# Patient Record
Sex: Male | Born: 1940 | Race: White | Hispanic: No | Smoking: Former smoker
Health system: Southern US, Community
[De-identification: ages and names within clinical notes are randomized; demographics above are authoritative.]

## PROBLEM LIST (undated history)

## (undated) DIAGNOSIS — E119 Type 2 diabetes mellitus without complications: Secondary | ICD-10-CM

## (undated) DIAGNOSIS — E039 Hypothyroidism, unspecified: Secondary | ICD-10-CM

## (undated) DIAGNOSIS — I2510 Atherosclerotic heart disease of native coronary artery without angina pectoris: Secondary | ICD-10-CM

## (undated) DIAGNOSIS — R42 Dizziness and giddiness: Secondary | ICD-10-CM

## (undated) DIAGNOSIS — I509 Heart failure, unspecified: Secondary | ICD-10-CM

## (undated) DIAGNOSIS — F0390 Unspecified dementia without behavioral disturbance: Secondary | ICD-10-CM

## (undated) DIAGNOSIS — M79606 Pain in leg, unspecified: Secondary | ICD-10-CM

## (undated) DIAGNOSIS — K469 Unspecified abdominal hernia without obstruction or gangrene: Secondary | ICD-10-CM

## (undated) DIAGNOSIS — I10 Essential (primary) hypertension: Secondary | ICD-10-CM

## (undated) DIAGNOSIS — E785 Hyperlipidemia, unspecified: Secondary | ICD-10-CM

## (undated) DIAGNOSIS — I739 Peripheral vascular disease, unspecified: Secondary | ICD-10-CM

## (undated) DIAGNOSIS — Z993 Dependence on wheelchair: Secondary | ICD-10-CM

## (undated) DIAGNOSIS — I714 Abdominal aortic aneurysm, without rupture, unspecified: Secondary | ICD-10-CM

## (undated) DIAGNOSIS — J449 Chronic obstructive pulmonary disease, unspecified: Secondary | ICD-10-CM

## (undated) DIAGNOSIS — C189 Malignant neoplasm of colon, unspecified: Secondary | ICD-10-CM

## (undated) HISTORY — DX: Essential (primary) hypertension: I10

## (undated) HISTORY — DX: Type 2 diabetes mellitus without complications: E11.9

## (undated) HISTORY — DX: Atherosclerotic heart disease of native coronary artery without angina pectoris: I25.10

## (undated) HISTORY — DX: Heart failure, unspecified: I50.9

## (undated) HISTORY — DX: Peripheral vascular disease, unspecified: I73.9

## (undated) HISTORY — PX: HERNIA REPAIR: SHX51

## (undated) HISTORY — DX: Unspecified abdominal hernia without obstruction or gangrene: K46.9

## (undated) HISTORY — PX: CARDIAC CATHETERIZATION: SHX172

## (undated) HISTORY — PX: ABDOMINAL SURGERY: SHX537

## (undated) HISTORY — DX: Abdominal aortic aneurysm, without rupture: I71.4

## (undated) HISTORY — DX: Chronic obstructive pulmonary disease, unspecified: J44.9

## (undated) HISTORY — DX: Abdominal aortic aneurysm, without rupture, unspecified: I71.40

## (undated) HISTORY — DX: Hyperlipidemia, unspecified: E78.5

## (undated) HISTORY — DX: Pain in leg, unspecified: M79.606

## (undated) HISTORY — PX: APPENDECTOMY: SHX54

## (undated) HISTORY — DX: Malignant neoplasm of colon, unspecified: C18.9

## (undated) HISTORY — DX: Hypothyroidism, unspecified: E03.9

## (undated) HISTORY — DX: Dizziness and giddiness: R42

---

## 2000-10-04 ENCOUNTER — Ambulatory Visit (HOSPITAL_COMMUNITY): Admission: RE | Admit: 2000-10-04 | Discharge: 2000-10-04 | Payer: Self-pay | Admitting: Family Medicine

## 2000-10-04 ENCOUNTER — Encounter: Payer: Self-pay | Admitting: Family Medicine

## 2003-11-23 ENCOUNTER — Emergency Department (HOSPITAL_COMMUNITY): Admission: EM | Admit: 2003-11-23 | Discharge: 2003-11-23 | Payer: Self-pay | Admitting: Emergency Medicine

## 2010-07-26 ENCOUNTER — Ambulatory Visit (HOSPITAL_COMMUNITY)
Admission: RE | Admit: 2010-07-26 | Discharge: 2010-07-26 | Disposition: A | Discharge status: Home / Self Care | Payer: Medicare Other | Source: Ambulatory Visit | Attending: Podiatry | Admitting: Podiatry

## 2010-07-26 ENCOUNTER — Other Ambulatory Visit (HOSPITAL_COMMUNITY): Payer: Self-pay | Admitting: Podiatry

## 2010-07-26 DIAGNOSIS — R52 Pain, unspecified: Secondary | ICD-10-CM

## 2010-07-26 DIAGNOSIS — I10 Essential (primary) hypertension: Secondary | ICD-10-CM | POA: Insufficient documentation

## 2010-07-26 DIAGNOSIS — I739 Peripheral vascular disease, unspecified: Secondary | ICD-10-CM | POA: Insufficient documentation

## 2010-07-26 DIAGNOSIS — R234 Changes in skin texture: Secondary | ICD-10-CM

## 2010-07-26 DIAGNOSIS — E119 Type 2 diabetes mellitus without complications: Secondary | ICD-10-CM | POA: Insufficient documentation

## 2010-08-03 ENCOUNTER — Encounter (INDEPENDENT_AMBULATORY_CARE_PROVIDER_SITE_OTHER): Payer: Medicare Other | Admitting: Vascular Surgery

## 2010-08-03 DIAGNOSIS — I70219 Atherosclerosis of native arteries of extremities with intermittent claudication, unspecified extremity: Secondary | ICD-10-CM

## 2010-08-04 NOTE — Consult Note (Signed)
NEW PATIENT CONSULTATION  Peter Harvey, Peter Harvey DOB:  28-Feb-1941                                       08/03/2010 CHART#:[id number]  I saw this patient in the office today in consultation concerning his discolored toes on the right foot.  He was referred by Dr. Nolen Mu. This is a pleasant 70 year old gentleman who developed the sudden onset of discoloration of the right third and fourth toes several weeks ago. He also had some bluish discoloration of both feet for some time.  He was sent for vascular consultation.  He states he does not remember any injury to the right foot and the discoloration occurred suddenly.  There has  been slight pain associated with the discoloration which has been stable.  He gives no history of rest pain.[name]  He did have some wounds on his legs about 10 years ago which healed without any problem.  He is unaware of any history of DVT or phlebitis.  PAST MEDICAL HISTORY:  Significant for non-insulin-dependent diabetes, hypertension, and hypercholesterolemia.  He denies any history of previous myocardial infarction, history of congestive heart failure or history of COPD.  PAST SURGICAL HISTORY:  Significant for previous appendectomy and bilateral inguinal hernia repairs.  SOCIAL HISTORY:  He is single.  He has 3 children.  He is retired.  He smokes a pack and a half of cigarettes.  He has been smoking for 40 years.  He does not drink alcohol on a regular basis.  FAMILY HISTORY:  He is unaware of any history of premature cardiovascular disease.  He had a brother who had a heart attack at age 73.  REVIEW OF SYSTEMS:  GENERAL:  He had no recent weight loss, weight gain or problems with his appetite. CARDIOVASCULAR:  He had no chest pain, chest pressure, palpitations or arrhythmias.  He has had no history of stroke, TIAs or amaurosis fugax. GI, neurologic, pulmonary, hematologic, GU, ENT, musculoskeletal, psychiatric, integumentary  review of systems is unremarkable and is documented on the medical history form in his chart.  PHYSICAL EXAMINATION:  GENERAL:  This is a pleasant 70 year old gentleman who appears his stated age. VITAL SIGNS:  Blood pressure 211/98, heart rate is 56, saturation 96%. HEENT:  Unremarkable. LUNGS:  Clear bilaterally to auscultation without rales, rhonchi or wheezing. CARDIOVASCULAR:  I do not detect any carotid bruits.  He has a regular rate and rhythm.  He has palpable brachial, radial and femoral pulses bilaterally.  I cannot palpate popliteal or pedal pulses.  He has hyperpigmentation bilaterally.  His femoral pulses especially on the right side are fairly prominent.  He also has a somewhat prominent aortic pulse. EXTREMITIES:  Exam reveals bluish discoloration of the right third and fourth toe and adjacent fourth toe.  He has some acrocyanosis bilaterally. ABDOMEN:  Soft and nontender with normal pitched bowel sounds and easily palpable aorta which appears somewhat enlarged.  NEUROLOGIC:  There s no focal weakness or paresthesias. SKIN:  There are no ulcers or rashes. He does have hyperpigmentation bilaterally.  I have reviewed his duplex scan from [facility] which shows an ABI on the right of 50% with a right great toe pressure of 23 mmHg.  On the left side, ABI is  54% with a left great toe pressure of 65 mmHg. Duplex suggest multilevel arterial occlusive disease.  I have also reviewed the  records from Dr. Loralie Champagne office.  He has been following the toes and treating these conservatively with Epsom salt soaks and topical medications.  Based on his exam, he may have an abdominal aortic aneurysm and actually arteriomegaly or ectasia of his femoral arteries in addition.  If he does have an aneurysm, this could potentially be a source embolization. He also clearly has evidence of multilevel infrainguinal arterial occlusive disease.  If the wounds on the right foot  progress, certainly this could become a limb threatening problem.  I have recommended we proceed with arteriography to evaluate his occlusive disease and also assess for possible aneurysm.  We have discussed the indications for the procedure and potential complications including but not  limited to bleeding or arterial injury.  He has also complained of some unsteady gait and when he returns for his follow-up visit after his arteriogram will also obtain an ultrasound his aorta to further document the size of the aneurysm if in fact he does have an aneurysm and also will obtain a carotid duplex scan to workup his vertebrobasilar symptoms and see if he has any evidence of vertebral disease or significant carotid disease.  I have also had a long discussion with him about the importance of tobacco cessation today and he understands this not only compromises his circulation but also increases his risk for aneurysm enlargement if in fact he does have an aneurysm.  Will make further recommendations pending results of his workup.  I will plan on seeing him back next week.    Di Kindle. Edilia Bo, M.D. Electronically Signed  CSD/MEDQ  D:  08/03/2010  T:  08/04/2010  Job:  [id number]  cc:   B. Theola Sequin, MD

## 2010-08-10 ENCOUNTER — Ambulatory Visit (INDEPENDENT_AMBULATORY_CARE_PROVIDER_SITE_OTHER): Payer: Medicare Other | Admitting: Vascular Surgery

## 2010-08-10 ENCOUNTER — Encounter: Payer: Self-pay | Admitting: Cardiovascular Disease

## 2010-08-10 ENCOUNTER — Other Ambulatory Visit (INDEPENDENT_AMBULATORY_CARE_PROVIDER_SITE_OTHER): Payer: Medicare Other

## 2010-08-10 ENCOUNTER — Encounter (INDEPENDENT_AMBULATORY_CARE_PROVIDER_SITE_OTHER): Payer: Medicare Other

## 2010-08-10 DIAGNOSIS — I6529 Occlusion and stenosis of unspecified carotid artery: Secondary | ICD-10-CM

## 2010-08-10 DIAGNOSIS — R269 Unspecified abnormalities of gait and mobility: Secondary | ICD-10-CM

## 2010-08-10 DIAGNOSIS — I714 Abdominal aortic aneurysm, without rupture: Secondary | ICD-10-CM

## 2010-08-11 NOTE — Assessment & Plan Note (Signed)
OFFICE VISIT  Peter Harvey, Peter Harvey DOB:  Oct 13, 1940                                       08/10/2010 CHART#:[id number]  HISTORY:  I saw this patient in the office today for followup of his discoloration of his toes on the right foot.  I had seen him in consultation on 08/03/2010.  He comes in for carotid duplex scan as she has had some problems with dizziness.  In addition, I was suspicious that he might have an aneurysm.  He had a duplex of his aorta.  Since I saw him last he states that he had a small amount of drainage between the fourth and third toes of the right foot.  He has had no fever.  He is diabetic.  He has had no abdominal or back pain.  He continues to have problems with dizziness.  REVIEW OF SYSTEMS:  CARDIOVASCULAR:  He had no chest pain, chest pressure, palpitations or arrhythmias. PULMONARY:  He had no productive cough, bronchitis, asthma or wheezing.  PHYSICAL EXAMINATION:  This a pleasant 70 year old gentleman who appears his stated age.  Blood pressure is 186/90, heart rate is 66, respiratory rate is 20.  Lungs:  Clear bilaterally to auscultation.  Cardiovascular exam:  He has a regular rate and rhythm.  He has palpable femoral pulses.  I could not palpate pedal pulses.  Neurologic:  He has no focal weakness or paresthesias.  I did independently interpret his carotid duplex scan which shows a 60% to 79% left carotid stenosis with no significant stenosis on the right. Both vertebral arteries are patent with normally directed flow.  The duplex of his aorta shows the maximum diameter of his aneurysm is 5.9 cm.  Both iliacs were also ectatic.  Right common iliac artery measures 2.3 cm in maximum diameter and the left common iliac,  2.98 cm in maximum diameter.  Given the size of his abdominal aortic aneurysm, I have recommend elective repair.  He has bilateral common iliac artery aneurysms also. Will schedule him for an arteriogram,  and also a CT scan to evaluate him to see if he is a candidate for endovascular repair although with the iliac aneurysms, this may not be possible.  There is poor visualization of the aorta proximally with reason I think it would be necessary the CAT scan.  In addition in anticipation of elective repair of his aneurysm, will set him up with our cardiology for preoperative cardiac evaluation.  Once his workup is complete, we can schedule elective repair of the aneurysm.  In the meantime, will continue to keep a close eye on his toes.  I have instructed him on how to keep dry gauze between the toes and also to continue to soak the foot in lukewarm Dial soap soaks. I have also written him a prescription for Keflex for 2 weeks. In addition he is having pain in the foot and so I have give him a prescription for Percocet #30 for pain.  His arteriogram is scheduled for 08/22/2010.    Di Kindle. Edilia Bo, M.D. Electronically Signed  CSD/MEDQ  D:  08/10/2010  T:  08/11/2010  Job:  [id number]  cc:   B. Theola Sequin, MD [facility]

## 2010-08-12 ENCOUNTER — Other Ambulatory Visit: Payer: Self-pay | Admitting: Vascular Surgery

## 2010-08-12 DIAGNOSIS — I714 Abdominal aortic aneurysm, without rupture: Secondary | ICD-10-CM

## 2010-08-17 ENCOUNTER — Ambulatory Visit
Admission: RE | Admit: 2010-08-17 | Discharge: 2010-08-17 | Disposition: A | Discharge status: Home / Self Care | Payer: Medicare Other | Source: Ambulatory Visit | Attending: Vascular Surgery | Admitting: Vascular Surgery

## 2010-08-17 DIAGNOSIS — I714 Abdominal aortic aneurysm, without rupture: Secondary | ICD-10-CM

## 2010-08-17 MED ORDER — IOHEXOL 300 MG/ML  SOLN
100.0000 mL | Freq: Once | INTRAMUSCULAR | Status: AC | PRN
  Administered 2010-08-17: 100 mL via INTRAVENOUS

## 2010-08-22 ENCOUNTER — Ambulatory Visit (HOSPITAL_COMMUNITY)
Admission: RE | Admit: 2010-08-22 | Discharge: 2010-08-22 | Disposition: A | Discharge status: Home / Self Care | Payer: Medicare Other | Source: Ambulatory Visit | Attending: Vascular Surgery | Admitting: Vascular Surgery

## 2010-08-22 DIAGNOSIS — F17200 Nicotine dependence, unspecified, uncomplicated: Secondary | ICD-10-CM | POA: Insufficient documentation

## 2010-08-22 DIAGNOSIS — I723 Aneurysm of iliac artery: Secondary | ICD-10-CM | POA: Insufficient documentation

## 2010-08-22 DIAGNOSIS — E119 Type 2 diabetes mellitus without complications: Secondary | ICD-10-CM | POA: Insufficient documentation

## 2010-08-22 DIAGNOSIS — I748 Embolism and thrombosis of other arteries: Secondary | ICD-10-CM | POA: Insufficient documentation

## 2010-08-22 DIAGNOSIS — I714 Abdominal aortic aneurysm, without rupture, unspecified: Secondary | ICD-10-CM | POA: Insufficient documentation

## 2010-08-22 DIAGNOSIS — I10 Essential (primary) hypertension: Secondary | ICD-10-CM | POA: Insufficient documentation

## 2010-08-22 DIAGNOSIS — I70299 Other atherosclerosis of native arteries of extremities, unspecified extremity: Secondary | ICD-10-CM | POA: Insufficient documentation

## 2010-08-22 DIAGNOSIS — E785 Hyperlipidemia, unspecified: Secondary | ICD-10-CM | POA: Insufficient documentation

## 2010-08-22 DIAGNOSIS — I724 Aneurysm of artery of lower extremity: Secondary | ICD-10-CM

## 2010-08-22 DIAGNOSIS — I708 Atherosclerosis of other arteries: Secondary | ICD-10-CM | POA: Insufficient documentation

## 2010-08-22 LAB — GLUCOSE, CAPILLARY: Glucose-Capillary: 108 mg/dL — ABNORMAL HIGH (ref 70–99)

## 2010-08-23 LAB — POCT I-STAT, CHEM 8
Chloride: 108 mEq/L (ref 96–112)
HCT: 44 % (ref 39.0–52.0)
Potassium: 4.3 mEq/L (ref 3.5–5.1)

## 2010-08-23 NOTE — Op Note (Signed)
NAME:  Peter Harvey, Peter Harvey NO.:  [id number]  MEDICAL RECORD NO.:  [id number]           PATIENT TYPE:  O  LOCATION:  [facility]                         FACILITY:  [facility]  PHYSICIAN:  Di Kindle. Edilia Bo, M.D.DATE OF BIRTH:  [birth year]  DATE OF PROCEDURE:  08/22/2010 DATE OF DISCHARGE:                              OPERATIVE REPORT   PROCEDURE: 1. Ultrasound-guided access to the left common femoral artery. 2. Aortogram with bilateral iliac arteriogram and bilateral lower     extremity runoff.  INDICATIONS:  This is a 70 year old gentleman who had presented with discoloration of his toes of the right foot.  It was felt that he likely had an abdominal aortic aneurysm and possibly atheroembolic disease but also significant infrainguinal arterial occlusive disease bilaterally. He has had an ultrasound of his aorta which showed a 5.9-cm infrarenal abdominal aortic aneurysm with bilateral iliac artery aneurysms.  The right common iliac artery measured 2.3 cm in maximum diameter and the left common iliac artery measured 2.98 cm in maximum diameter.  He underwent a CT scan which showed a suspicious mass in the colon at the level of the ileocecal valve in addition to a large greater than 6 cm abdominal aortic aneurysm with bilateral common iliac artery aneurysms and bilateral common femoral artery aneurysms.  He also had bilateral occlusions of the hypogastric arteries by CT scan.  TECHNIQUE:  The patient was taken to the PV lab and received a milligram of Versed and 50 mcg of fentanyl.  Both groins were prepped and draped in usual sterile fashion.  After the skin was infiltrated with 1% lidocaine and under ultrasound guidance, the left common femoral artery was cannulated above the level of the aneurysm.  A 5-French sheath was introduced over a wire.  Pigtail catheter was positioned at the L1 vertebral body and flush aortogram obtained.  The catheter was then repositioned  above the aortic bifurcation and oblique iliac projections were obtained.  Bilateral lower extremity runoff films were obtained. Additional spot films were obtained in the left leg to the left femoral sheath.  FINDINGS:  There were single renal arteries bilaterally with no significant renal artery stenosis identified.  The patient appears to have a reasonable neck by arteriography below the renals.  There was a large aneurysm, the size cannot be determined by the study as there is significant laminated thrombus.  There also is ectasia and aneurysm disease of bilateral common iliac arteries.  The hypogastric arteries are occluded bilaterally.  The common femoral arteries are also aneurysmal.  Superficial femoral arteries are occluded bilaterally.  On the right side, there is reconstitution of the posterior tibial and peroneal arteries.  There was a high takeoff of the posterior tibial artery on the right at the level of the knee.  On the left side, there was a superficial femoral artery occlusion with reconstitution of the posterior tibial artery distally and the peroneal artery.  Deep femoral arteries are patent bilaterally with some mild diffuse disease.  CONCLUSIONS: 1. Abdominal aortic aneurysm, bilateral iliac artery aneurysms,     bilateral common femoral artery aneurysms. 2. Bilateral hypogastric artery occlusions. 3. Severe infrainguinal arterial occlusive  disease bilaterally as     described above.     Di Kindle. Edilia Bo, M.D.     CSD/MEDQ  D:  08/22/2010  T:  08/22/2010  Job:  [id number]  cc:   Dr. Theola Sequin. [facility].  Electronically Signed by Waverly Ferrari M.D. on 08/23/2010 08:39:40 AM

## 2010-08-24 ENCOUNTER — Ambulatory Visit (INDEPENDENT_AMBULATORY_CARE_PROVIDER_SITE_OTHER): Payer: Medicare Other | Admitting: Cardiovascular Disease

## 2010-08-24 ENCOUNTER — Encounter: Payer: Self-pay | Admitting: Cardiovascular Disease

## 2010-08-24 DIAGNOSIS — R0989 Other specified symptoms and signs involving the circulatory and respiratory systems: Secondary | ICD-10-CM

## 2010-08-24 DIAGNOSIS — I10 Essential (primary) hypertension: Secondary | ICD-10-CM

## 2010-08-24 DIAGNOSIS — I714 Abdominal aortic aneurysm, without rupture: Secondary | ICD-10-CM

## 2010-08-24 DIAGNOSIS — F17200 Nicotine dependence, unspecified, uncomplicated: Secondary | ICD-10-CM | POA: Insufficient documentation

## 2010-08-24 DIAGNOSIS — Z01818 Encounter for other preprocedural examination: Secondary | ICD-10-CM | POA: Insufficient documentation

## 2010-08-24 NOTE — Patient Instructions (Signed)
Your physician has requested that you have a lexiscan myoview. For further information please visit [email or web address] Please follow instruction sheet, as given.  Your physician recommends that you schedule a follow-up appointment in:  As needed with Dr. Eden Emms

## 2010-08-24 NOTE — Progress Notes (Signed)
Peter Harvey is seen at the request of Dr Durwin Nora.  He has bilateral iliac aneurysm,AAA, PVD with gangrenous right toes.  He is a smoker.  Has exertional dyspnea but not chest pain.   He needs clearance for vascular surgery.  Very poor functional status.  In wheelchair in our office.  No history of CAD, MI, CHF, syncope or palpitations.  Has HTN and DM followed by Dr Regino Schultze in [place].  Significant dizzyness that limits mobility.  AAA is 5.9 cm  With RCI 2.3 cm and LCI 2.98 cm.  ICA stenosis 60-79% on left.  Given high risk surgery, poor functional status and documented vascular disease guidelines indicate need for lexiscan myovue.    ROS: Denies fever, malais, weight loss, blurry vision, decreased visual acuity, cough, sputum, SOB, hemoptysis, pleuritic pain, palpitaitons, heartburn, abdominal pain, melena, lower extremity edema, claudication, or rash.   General: Affect appropriate Chronically ill appearing HEENT: normal Neck supple with no adenopathy JVP normal left  bruits no thyromegaly Lungs clear with no wheezing and good diaphragmatic motion Heart:  S1/S2 no murmur,rub, gallop or click PMI normal Abdomen: benighn, BS positve, no tenderness, AAA palpable no bruit.  No HSM or HJR Distal pulses poor with femoral bruit and dry gangrene toes right foot Plus one bilateral  edema Neuro non-focal Skin warm and dry No muscular weakness  Medications Current Outpatient Prescriptions  Medication Sig Dispense Refill  . aspirin 81 MG EC tablet Take 81 mg by mouth daily.        [name] atenolol (TENORMIN) 50 MG tablet Take 50 mg by mouth daily.        . cephALEXin (KEFLEX) 500 MG capsule Take 500 mg by mouth 3 (three) times daily.        [name] glyBURIDE-metformin (GLUCOVANCE) 5-500 MG per tablet Take 1 tablet by mouth daily with breakfast.        . levothyroxine (SYNTHROID, LEVOTHROID) 100 MCG tablet Take 100 mcg by mouth daily.        [name] oxyCODONE-acetaminophen (PERCOCET) 5-325 MG per tablet Take 1 tablet  by mouth every 4 (four) hours as needed.        . quinapril (ACCUPRIL) 20 MG tablet Take 20 mg by mouth at bedtime.          Allergies Review of patient's allergies indicates no known allergies.  Family History: Family History  Problem Relation Age of Onset  . Diabetes Mother     Social History: History   Social History  . Marital Status: Married    Spouse Name: N/A    Number of Children: N/A  . Years of Education: N/A   Occupational History  . Not on file.   Social History Main Topics  . Smoking status: Current Some Day Smoker  . Smokeless tobacco: Not on file  . Alcohol Use: Not on file  . Drug Use: Not on file  . Sexually Active: Not on file   Other Topics Concern  . Not on file   Social History Narrative  . No narrative on file    Electrocardiogram:  NSR low atrial focus no previous MI HR 60 nonspecific lateral T wave changes 08/22/10   Assessment and Plan

## 2010-08-24 NOTE — Assessment & Plan Note (Signed)
Well controlled.  Continue current medications and low sodium Dash type diet.    

## 2010-08-24 NOTE — Assessment & Plan Note (Signed)
F/U carotid duplex with Dr Edilia Bo in 6 months

## 2010-08-24 NOTE — Assessment & Plan Note (Signed)
High risk surgery, CRF;s, known vascular diseae, DM and poor functional status.  Lexiscan myovue to clear.

## 2010-08-24 NOTE — Assessment & Plan Note (Signed)
Counseled for less than 10 minutes.  Quitting even two weeks before surgery would be beneficial.  Consider welbutrin in post op phase

## 2010-08-25 NOTE — Procedures (Unsigned)
DUPLEX ULTRASOUND OF ABDOMINAL AORTA  INDICATION:  Abdominal aortic aneurysm.  HISTORY: Diabetes:  Yes Cardiac:  No Hypertension:  Yes Smoking:  Yes, 1-1/2 packs per day Connective Tissue Disorder: Family History:  No Previous Surgery:  No  DUPLEX EXAM:         AP (cm)                   TRANSVERSE (cm) Proximal             Not visualized            Not visualized Mid                  5.71 cm                   5.90 cm Distal               5.61 cm                   5.95 cm Right Iliac          2.19 cm                   2.30 cm Left Iliac           2.49 cm                   2.98 cm  PREVIOUS:  Date:  AP:  TRANSVERSE:  IMPRESSION: 1. Abdominal aortic aneurysm noted in the mid to distal abdominal     aorta that measures 5.61 x 5.95 cm. 2. Proximal abdominal aorta not visualized due to overlying bowel gas.  ___________________________________________ Di Kindle. Edilia Bo, M.D.  EM/MEDQ  D:  08/10/2010  T:  08/10/2010  Job:  [id number]

## 2010-08-25 NOTE — Procedures (Unsigned)
CAROTID DUPLEX EXAM  INDICATION:  Unsteady gait.  HISTORY: Diabetes:  Yes Cardiac:  No Hypertension:  Yes Smoking:  Yes, 1-1/2 packs per day Previous Surgery:  No CV History:  Dizziness, unsteady gait. Amaurosis Fugax No, Paresthesias Yes, Hemiparesis No                                      RIGHT             LEFT Brachial systolic pressure: Brachial Doppler waveforms: Vertebral direction of flow:        antegrade         antegrade DUPLEX VELOCITIES (cm/sec) CCA peak systolic                   91                66 ECA peak systolic                   51                116 ICA peak systolic                   106               268 ICA end diastolic                   33                97 PLAQUE MORPHOLOGY:                  mixed             mixed PLAQUE AMOUNT:                      mild              moderate/severe PLAQUE LOCATION:                    CCA, ICA          ICA, CCA, ECA  IMPRESSION: 1. Right internal carotid artery velocities suggest 1%-39% stenosis. 2. Left internal carotid artery velocities suggest 60%-79% stenosis     (high end of range). 3. Antegrade vertebral arteries bilaterally.  ___________________________________________ Di Kindle. Edilia Bo, M.D.  EM/MEDQ  D:  08/10/2010  T:  08/10/2010  Job:  [id number]

## 2010-08-30 ENCOUNTER — Ambulatory Visit (HOSPITAL_COMMUNITY): Payer: Medicare Other | Attending: Cardiovascular Disease | Admitting: Radiology

## 2010-08-30 DIAGNOSIS — I714 Abdominal aortic aneurysm, without rupture, unspecified: Secondary | ICD-10-CM | POA: Insufficient documentation

## 2010-08-30 DIAGNOSIS — R0602 Shortness of breath: Secondary | ICD-10-CM

## 2010-08-30 DIAGNOSIS — R0609 Other forms of dyspnea: Secondary | ICD-10-CM

## 2010-08-30 DIAGNOSIS — Z01810 Encounter for preprocedural cardiovascular examination: Secondary | ICD-10-CM | POA: Insufficient documentation

## 2010-08-30 DIAGNOSIS — E119 Type 2 diabetes mellitus without complications: Secondary | ICD-10-CM

## 2010-08-30 DIAGNOSIS — Z01818 Encounter for other preprocedural examination: Secondary | ICD-10-CM

## 2010-08-30 MED ORDER — TECHNETIUM TC 99M TETROFOSMIN IV KIT
33.0000 | PACK | Freq: Once | INTRAVENOUS | Status: AC | PRN
  Administered 2010-08-30: 33 via INTRAVENOUS

## 2010-08-30 MED ORDER — REGADENOSON 0.4 MG/5ML IV SOLN
0.4000 mg | Freq: Once | INTRAVENOUS | Status: AC
  Administered 2010-08-30: 0.4 mg via INTRAVENOUS

## 2010-08-30 MED ORDER — TECHNETIUM TC 99M TETROFOSMIN IV KIT
11.0000 | PACK | Freq: Once | INTRAVENOUS | Status: AC | PRN
  Administered 2010-08-30: 11 via INTRAVENOUS

## 2010-08-30 NOTE — Progress Notes (Signed)
[facility] SITE 3 NUCLEAR MED [address] [place] [address] [phone]  Cardiology Nuclear Med Study  Peter Harvey is a 70 y.o. male [id number] [birth year]   Nuclear Med Background Indication for Stress Test:  Evaluation for Ischemia and Surgical Clearance for AAA and Bilateral Iliac Aneursym History:  No previous documented CAD Cardiac Risk Factors: Carotid Disease, Claudication, Hypertension, NIDDM, PVD and Smoker  Symptoms:  Dizziness and DOE   Nuclear Pre-Procedure Caffeine/Decaff Intake:  None NPO After: 600pm   Lungs:  clear IV 0.9% NS with Angio Cath:  22g  IV Site: R Hand  IV Started by:  Cathlyn Parsons, RN  Chest Size (in):  38 Cup Size: n/a  Height: 5\' 11"  (1.803 m)  Weight:  149 lb (67.586 kg)  BMI:  Body mass index is 20.78 kg/(m^2). Tech Comments:  Atenolol held x 4 hrs and Blood sugar @ 9am was 179;no Metformin    Nuclear Med Study 1 or 2 day study: 1 day  Stress Test Type:  Eugenie Birks  Reading MD: Arvilla Meres, MD  Order Authorizing Provider:  Dr.Nishan  Resting Radionuclide: Technetium 78m Tetrofosmin  Resting Radionuclide Dose: 11 mCi   Stress Radionuclide:  Technetium 69m Tetrofosmin  Stress Radionuclide Dose: 33 mCi           Stress Protocol Rest HR: 47 Sress HR:61  Rest BP: 149/102 Stress BP:146/87  Exercise Time (min): n/a METS: n/a          Dose of Adenosine (mg):  n/a Dose of Lexiscan: 0.4 mg  Dose of Atropine (mg): n/a Dose of Dobutamine: n/a mcg/kg/min (at max HR)  Stress Test Technologist: Frederick Peers, EMT-P  Nuclear Technologist:  Doyne Keel, CNMT     Rest Procedure:  Myocardial perfusion imaging was performed at rest 45 minutes following the intravenous administration of Technetium 1m Tetrofosmin. Rest ECG: NS with PVCs  Stress Procedure:  The patient received IV Lexiscan 0.4 mg over 15-seconds.  Technetium 13m Tetrofosmin injected at 30-seconds.  There were non specific ST changes with  Lexiscan/PVCs,/BIgemny.  Quantitative spect images were obtained after a 45 minute delay. Stress ECG: No significant change from baseline ECG  QPS Raw Data Images:  Normal; no motion artifact; normal heart/lung ratio. Stress Images:  There is decreased uptake in the inferior wall. Rest Images:  There is decreased uptake in the inferior wall. Subtraction (SDS):  Previous inferior infarct with mild peri-infarct ischemia. Transient Ischemic Dilatation (Normal <1.22):  1.03 Lung/Heart Ratio (Normal <0.45):  0.31  Quantitative Gated Spect Images QGS EDV:  125 ml QGS ESV:  57 ml QGS cine images:  LV is mildly dialted. Normal LV function. QGS EF: 57%  Impression Exercise Capacity:  Lexiscan with no exercise. BP Response:  n/a Clinical Symptoms:  n/a ECG Impression:  No significant ST segment change suggestive of ischemia. Comparison with Prior Nuclear Study: No images to compare  Overall Impression:  Abnormal stress nuclear study. Previous inferior infarct with mild peri-infarct ischemia.     [name]

## 2010-08-31 NOTE — Progress Notes (Signed)
Routed to Dr.Nishan.Mirna Mires

## 2010-09-01 ENCOUNTER — Encounter: Payer: Self-pay | Admitting: *Deleted

## 2010-09-01 ENCOUNTER — Telehealth: Payer: Self-pay | Admitting: Cardiovascular Disease

## 2010-09-01 DIAGNOSIS — I10 Essential (primary) hypertension: Secondary | ICD-10-CM

## 2010-09-01 NOTE — Telephone Encounter (Signed)
Spoke with pt and daughter in law and gave them stress test results and Dr. Fabio Bering recommendation for cath with Dr. Excell Seltzer. Cath arranged for Sep 09, 2010 at 8:30 in [facility] with Dr. Excell Seltzer. They are aware pt will need lab work and chest X-ray done prior to procedure. Pt will go to [facility] on Sep 06, 2010 to have done. I verbally went over all instructions with daughter in law and she verbalizes understanding.  Will mail instructions to pt.

## 2010-09-01 NOTE — Telephone Encounter (Signed)
RN attempted to call Peter Harvey @ Dr Luan Moore office-out to lunch, left message to contact our office. Pt had Lexiscan on Tues, to be reviewed by Dr Eden Emms.

## 2010-09-01 NOTE — Telephone Encounter (Signed)
Called pt to give him results of stress test and need for cath.  Left message with daughter in law for pt to call back.

## 2010-09-01 NOTE — Telephone Encounter (Signed)
Status of cardiac clearance for colonoscopy- scheduled pending 6/7. Fax  [phone].

## 2010-09-01 NOTE — Telephone Encounter (Signed)
Call patient son to clarify his colonoscopy md. Pt MD is Dr. Evette Cristal office # [phone].

## 2010-09-01 NOTE — Telephone Encounter (Signed)
Spoke with Irving Burton at Dr. Luan Moore office.  They need clearance from Dr. Eden Emms to proceed with colonoscopy on October 13, 2010. Also requesting copy of stress test to be faxed to their office once reviewed by Dr Eden Emms.

## 2010-09-01 NOTE — Telephone Encounter (Signed)
RN attempted to contact Irving Burton regarding if sedation would be used, seems to be a fax number.  Pt is only on ASA 81 mg. RN will forward message to Dr Clance Boll re: cardiac clearance. RN attempted to call Pt, left message to call back.

## 2010-09-01 NOTE — Telephone Encounter (Signed)
Addended byGenice Rouge on: 09/01/2010 04:50 PM   Modules accepted: Orders

## 2010-09-01 NOTE — Telephone Encounter (Signed)
Needs cath before colonoscopy and AAA surgery

## 2010-09-01 NOTE — Telephone Encounter (Signed)
Peter Harvey at Dr. Luan Moore office notified that pt will need cath prior to procedure.

## 2010-09-01 NOTE — Progress Notes (Signed)
This guy needs a cath to clear for surgery. Set up for [facility] next week from radial artery (per message from Dr Eden Emms) Pt aware. Cath scheduled for Sep 09, 2010 at 8:30 with Dr. Excell Seltzer.

## 2010-09-02 ENCOUNTER — Other Ambulatory Visit (INDEPENDENT_AMBULATORY_CARE_PROVIDER_SITE_OTHER): Payer: Medicare Other

## 2010-09-02 ENCOUNTER — Ambulatory Visit (INDEPENDENT_AMBULATORY_CARE_PROVIDER_SITE_OTHER)
Admission: RE | Admit: 2010-09-02 | Discharge: 2010-09-02 | Disposition: A | Discharge status: Home / Self Care | Payer: Medicare Other | Source: Ambulatory Visit | Attending: Cardiovascular Disease | Admitting: Cardiovascular Disease

## 2010-09-02 DIAGNOSIS — I714 Abdominal aortic aneurysm, without rupture: Secondary | ICD-10-CM

## 2010-09-02 DIAGNOSIS — R9430 Abnormal result of cardiovascular function study, unspecified: Secondary | ICD-10-CM

## 2010-09-02 DIAGNOSIS — I10 Essential (primary) hypertension: Secondary | ICD-10-CM

## 2010-09-02 LAB — BASIC METABOLIC PANEL
CO2: 32 mEq/L (ref 19–32)
Chloride: 107 mEq/L (ref 96–112)
Sodium: 144 mEq/L (ref 135–145)

## 2010-09-02 LAB — CBC WITH DIFFERENTIAL/PLATELET
Basophils Relative: 0.3 % (ref 0.0–3.0)
Eosinophils Absolute: 0.1 10*3/uL (ref 0.0–0.7)
HCT: 40.7 % (ref 39.0–52.0)
Hemoglobin: 13.7 g/dL (ref 13.0–17.0)
Lymphocytes Relative: 18.8 % (ref 12.0–46.0)
Lymphs Abs: 1.4 10*3/uL (ref 0.7–4.0)
MCHC: 33.7 g/dL (ref 30.0–36.0)
MCV: 86.9 fl (ref 78.0–100.0)
Monocytes Absolute: 0.6 10*3/uL (ref 0.1–1.0)
Neutro Abs: 5.6 10*3/uL (ref 1.4–7.7)
RBC: 4.69 Mil/uL (ref 4.22–5.81)

## 2010-09-09 ENCOUNTER — Inpatient Hospital Stay (HOSPITAL_BASED_OUTPATIENT_CLINIC_OR_DEPARTMENT_OTHER)
Admission: RE | Admit: 2010-09-09 | Discharge: 2010-09-09 | Disposition: A | Discharge status: Home / Self Care | Payer: Medicare Other | Source: Ambulatory Visit | Attending: Cardiovascular Disease | Admitting: Cardiovascular Disease

## 2010-09-09 DIAGNOSIS — I714 Abdominal aortic aneurysm, without rupture, unspecified: Secondary | ICD-10-CM | POA: Insufficient documentation

## 2010-09-09 DIAGNOSIS — I2510 Atherosclerotic heart disease of native coronary artery without angina pectoris: Secondary | ICD-10-CM

## 2010-09-09 DIAGNOSIS — R9439 Abnormal result of other cardiovascular function study: Secondary | ICD-10-CM | POA: Insufficient documentation

## 2010-09-09 DIAGNOSIS — I2582 Chronic total occlusion of coronary artery: Secondary | ICD-10-CM | POA: Insufficient documentation

## 2010-09-09 DIAGNOSIS — I739 Peripheral vascular disease, unspecified: Secondary | ICD-10-CM | POA: Insufficient documentation

## 2010-09-21 ENCOUNTER — Ambulatory Visit (INDEPENDENT_AMBULATORY_CARE_PROVIDER_SITE_OTHER): Payer: Medicare Other | Admitting: Vascular Surgery

## 2010-09-21 DIAGNOSIS — I714 Abdominal aortic aneurysm, without rupture: Secondary | ICD-10-CM

## 2010-09-21 DIAGNOSIS — L98499 Non-pressure chronic ulcer of skin of other sites with unspecified severity: Secondary | ICD-10-CM

## 2010-09-22 ENCOUNTER — Encounter: Payer: Self-pay | Admitting: Cardiovascular Disease

## 2010-09-22 NOTE — Assessment & Plan Note (Signed)
OFFICE VISIT  Peter Harvey, Peter Harvey DOB:  1940/09/24                                       09/21/2010 CHART#:[id number]  I saw the patient in the office today for continued follow-up of his multiple aneurysms and infrainguinal arterial occlusive disease.  This is a pleasant 70 year old gentleman who I had seen in consultation with some discolored toes on the right foot.  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  He subsequently had a CT scan to further assess his aneurysm, and this in addition to his aneurysms showed a soft tissue mass at the ileocecal valve, worrisome for possible colon cancer.  He has been referred to Dr. Evette Cristal at [facility] and is scheduled for a colonoscopy on 09/12/2010 to further work this up.  In addition, the patient is followed by Dr. Eden Emms and did have a cardiac catheterization by Dr. Tonny Bollman which showed severe right coronary artery stenosis with tandem lesions in the mid and distal vessel.  He had a moderate distal left main stenosis with total occlusion of a small obtuse marginal branch and a mild to moderate left anterior descending stenosis.  Dr. Excell Seltzer did not think that preoperative PCI was a good option given the risk of increased perioperative events in these patients, and I have discussed this with him on the phone today and he feels that it would be best to address the aneurysm and manage his cardiac disease medically.  The patient comes in today to review the case, as it is obviously complicated.  He has had no significant abdominal or back pain.  He has had no nausea, vomiting.  He has not noted any other changes bowel habits.  REVIEW OF SYSTEMS:  CARDIOVASCULAR:  He has had no chest pain, chest pressure, palpitations or  arrhythmias.  PHYSICAL EXAMINATION:  This is a debilitated 70 year old gentleman. Blood pressure is 185/94, heart rate is 47, saturation 98%.  Lungs: Clear bilaterally to auscultation.  Cardiovascular:  He has a regular rate and rhythm.  He has aneurysmal femoral pulses.  I cannot palpate pedal pulses.  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  This patient has a 6-cm infrarenal abdominal aortic aneurysm in addition to bilateral common iliac artery aneurysms and bilateral femoral artery aneurysms.  In addition, he has severe infrainguinal arterial occlusive disease with bilateral superficial femoral artery occlusions and tibial occlusive disease.  He also has bilateral hypogastric artery occlusions. We need to await the results of his colonoscopy before planning surgery. Hopefully if he does not have a malignancy, then we can proceed with open repair of his aneurysm which will require an aortobiprofunda bypass and possible right infrainguinal bypass given the limb-threatening situation on the right with dry gangrene of the toes.  If he does have a colon malignancy, then the issue would be which to address first, the colon malignancy or the aneurysms.  Unfortunately I am worried about healing colon resection given his bilateral hypogastric artery occlusions.  We will await the results of his colonoscopy before making further recommendations.  When he does return to hopefully plan repair of his aneurysms, we should get preoperative ABIs.  Di Kindle. Edilia Bo, M.D. Electronically Signed  CSD/MEDQ  D:  09/21/2010  T:  09/22/2010  Job:  [id number]  cc:   Veverly Fells. Excell Seltzer, MD Noralyn Pick. Eden Emms, MD, [facility] Graylin Shiver, M.D. Theola Sequin, M.D.

## 2010-09-23 ENCOUNTER — Encounter: Payer: Self-pay | Admitting: *Deleted

## 2010-09-23 ENCOUNTER — Encounter: Payer: Self-pay | Admitting: Cardiovascular Disease

## 2010-09-26 ENCOUNTER — Ambulatory Visit (INDEPENDENT_AMBULATORY_CARE_PROVIDER_SITE_OTHER): Payer: Medicare Other | Admitting: Cardiovascular Disease

## 2010-09-26 ENCOUNTER — Encounter: Payer: Self-pay | Admitting: Cardiovascular Disease

## 2010-09-26 DIAGNOSIS — I2510 Atherosclerotic heart disease of native coronary artery without angina pectoris: Secondary | ICD-10-CM | POA: Insufficient documentation

## 2010-09-26 DIAGNOSIS — Z01818 Encounter for other preprocedural examination: Secondary | ICD-10-CM

## 2010-09-26 DIAGNOSIS — I714 Abdominal aortic aneurysm, without rupture, unspecified: Secondary | ICD-10-CM | POA: Insufficient documentation

## 2010-09-26 NOTE — Assessment & Plan Note (Signed)
Actually after review of films has more surgical disease.  Stenting of RCA which was the ischemic territory on myovue would increase his risk of periop morbidity in the next 6 months.  Continue medical Rx especially beta blocker.  No angina due to sedentary status.  Will see after colonoscopy.  More likely palliative care if CA discovered

## 2010-09-26 NOTE — Assessment & Plan Note (Signed)
Apparantly not a candidate for stenting as he has bilateral iliac aneurysms and needs bypass with history of peripheral emboli.  If he has Dx of colon cancer would not proceed with any AAA surgery given functional status and prognosis from CA

## 2010-09-26 NOTE — Progress Notes (Signed)
Peter Harvey is seen at the request of Dr Durwin Nora. He has bilateral iliac aneurysm,AAA, PVD with gangrenous right toes. He is a smoker. Has exertional dyspnea but not chest pain. He needs clearance for vascular surgery. Very poor functional status. In wheelchair in our office. No history of CAD, MI, CHF, syncope or palpitations. Has HTN and DM followed by Dr Regino Schultze in [place]. Significant dizzyness that limits mobility. AAA is 5.9 cm With RCI 2.3 cm and LCI 2.98 cm. ICA stenosis 60-79% on left. Given high risk surgery, poor functional status and documented vascular disease guidelines indicate need for lexiscan myovue.   Lexiscan positive with inferior wall infarct and ischemia.  Reviewed cath films and discussed with Dr Excell Seltzer 5/4  Two tight lesions in mid and distal RCA.  Also distal LM, ostial circumflex and branch diagonal disease.    Agree that stenting of the RCA lesions would increase risk of periop event unless surgery can be put off for at least 6 months.    Recent CT 4/11 showed AAA is 6.2 by 6.1 cm with large thrombus burden  Also CT showed ? Colon cancer.    He is scheduled for colonoscopy with Dr Evette Cristal on 6/7.  Cleared to have this done from cardiac standpoint.  Told him to take his atenolol and quinapril the morning of the procedure Would also use the gentlest prep or even consider virtual colonoscopy for this patient.    If he ends up having colon cancer palliative care may be the final answer.  ROS: Denies fever, malais, weight loss, blurry vision, decreased visual acuity, cough, sputum, SOB, hemoptysis, pleuritic pain, palpitaitons, heartburn, abdominal pain, melena, lower extremity edema, claudication, or rash.   General: Affect appropriate Chronically ill HEENT: normal Neck supple with no adenopathy JVP normal no bruits no thyromegaly Lungs clear with no wheezing and good diaphragmatic motion Heart:  S1/S2 no murmur,rub, gallop or click PMI normal Abdomen: benighn, BS  positve, no tenderness, palpable AAA with decreased periph pulses no bruit.  No HSM or HJR Distal pulses intact with no bruits Plus one bilateral  edema Neuro non-focal Skin warm and dry No muscular weakness   Current Outpatient Prescriptions  Medication Sig Dispense Refill  . aspirin 81 MG EC tablet Take 81 mg by mouth daily.        [name] atenolol (TENORMIN) 50 MG tablet Take 50 mg by mouth daily.        . cephALEXin (KEFLEX) 500 MG capsule Take 500 mg by mouth 3 (three) times daily.        [name] glyBURIDE-metformin (GLUCOVANCE) 5-500 MG per tablet Take 1 tablet by mouth daily with breakfast.        . levothyroxine (SYNTHROID, LEVOTHROID) 100 MCG tablet Take 100 mcg by mouth daily.        [name] oxyCODONE-acetaminophen (PERCOCET) 5-325 MG per tablet Take 1 tablet by mouth every 4 (four) hours as needed.        . quinapril (ACCUPRIL) 20 MG tablet Take 20 mg by mouth at bedtime.          Allergies  Review of patient's allergies indicates no known allergies.  Electrocardiogram:  NSR 56 normal ECG   Assessment and Plan   .

## 2010-09-26 NOTE — Patient Instructions (Signed)
Your physician recommends that you schedule a follow-up appointment in: END OF June WITH DR [facility]  Your physician recommends that you continue on your current medications as directed. Please refer to the Current Medication list given to you today.

## 2010-09-26 NOTE — Assessment & Plan Note (Signed)
Clear for colonoscopy to R/O colon CA.  Continue beta blocker and ACE

## 2010-09-29 ENCOUNTER — Telehealth: Payer: Self-pay | Admitting: Cardiovascular Disease

## 2010-09-29 NOTE — Telephone Encounter (Signed)
Dr. Fabio Bering office note from 09/26/10 faxed to [phone]. Message left on Shannon's voicemail with this information.

## 2010-09-29 NOTE — Telephone Encounter (Signed)
Pt is schedule for colonoscopy on 6/7 . Need cardiac clearance. Fax # [phone].

## 2010-10-26 ENCOUNTER — Telehealth: Payer: Self-pay | Admitting: Cardiovascular Disease

## 2010-10-26 ENCOUNTER — Ambulatory Visit: Payer: Medicare Other | Admitting: Cardiovascular Disease

## 2010-10-26 ENCOUNTER — Ambulatory Visit: Payer: Medicare Other | Admitting: Vascular Surgery

## 2010-10-26 NOTE — Telephone Encounter (Signed)
Spoke with jermine, office note faxed to her attn Deliah Goody

## 2010-10-26 NOTE — Telephone Encounter (Signed)
PT NEEDS TO HAVE R HEMI COLECTOMY. IS PT CLEARED FOR THIS PROCEDURE?

## 2010-10-26 NOTE — Telephone Encounter (Signed)
She has a question regarding the surgical clearance that was faxed today

## 2010-10-26 NOTE — Telephone Encounter (Signed)
DR CORNETT OFFICE IS CALLING RE CARDIAC CLEARANCE DR OFFICE FAX  SUR CLEARANCE FORM ON October 18, 2010. NURSE WOULD LIKE TO SPEAK WITH A NURSE.

## 2010-10-27 ENCOUNTER — Encounter: Payer: Self-pay | Admitting: Cardiovascular Disease

## 2010-10-27 NOTE — Telephone Encounter (Signed)
He is at high risk for surgery.  If surgery is to stop spread of cancer should proceed.  If cancer is already metastatic should have palliative care.  We should be called to watch periop

## 2010-10-27 NOTE — Cardiovascular Report (Signed)
NAME:  Peter Harvey, Peter Harvey NO.:  [id number]  MEDICAL RECORD NO.:  [id number]           PATIENT TYPE:  O  LOCATION:  [id number]                     FACILITY:  [facility]  PHYSICIAN:  Peter Fells. Excell Seltzer, MD  DATE OF BIRTH:  December 26, 1940  DATE OF PROCEDURE: DATE OF DISCHARGE:                           CARDIAC CATHETERIZATION   PROCEDURES: 1. Left heart catheterization. 2. Selective coronary angiography. 3. Left ventricular angiography.  PROCEDURAL INDICATION:  Peter Harvey is a 70 year old gentleman with peripheral arterial disease.  He has ischemic toes and has been found to have on the large abdominal aortic aneurysm involving the iliacs and common femoral arteries.  He underwent a Myoview stress scan preoperatively that demonstrated inferior wall infarct and ischemia and was referred for cardiac catheterization.  I had initially planned on a transradial approach.  The patient's Freida Busman test was abnormal on both the right and left.  He has known common femoral aneurysms but I thought that was the safest way to proceed. Using modified Seldinger technique, the right common femoral artery was accessed via a front-wall puncture.  A 4-French sheath was advanced. There was no difficulty traversing the aortoiliac region and standard 4- French Judkins catheters were used.  All catheter exchanges were performed over a guidewire.  There were no immediate complications.  PROCEDURAL FINDINGS:  Aortic pressure 171/63 with a mean of 117, left ventricular pressure 162/21.  Coronary angiography:  The left mainstem is calcified.  There is moderate distal left mainstem stenosis estimated at 50% by angiography just prior to a trifurcation point of the LAD, intermediate branch, and left circumflex.  LAD:  The LAD has mild to moderate proximal stenosis also in the range of 50% diameter stenosis.  The LAD supplies a moderate caliber first diagonal branch that has diffuse disease without  high-grade focal stenosis.  The mid and distal LAD have nonobstructive plaque without severe stenoses noted.  The LAD reaches the left ventricular apex.  Left circumflex:  The left circumflex also has 50% proximal stenosis. The AV groove circumflex is small and courses down supplying a small posterolateral branch.  There is a small, diffusely diseased first obtuse marginal branch that is totally occluded in the distal OM fills late from left-to-left collaterals.  Right coronary artery.  The right coronary artery is moderately calcified.  The vessel has 70-75% mid stenosis and an 80% hypodense distal stenosis.  There is a PDA and posterolateral branch both of small to moderate caliber.  Left ventriculography shows normal LV function.  The ejection fraction is 65%.  ASSESSMENT: 1. Severe right coronary artery stenosis with tandem lesions in the     mid and distal vessel. 2. Moderate distal left main stenosis. 3. Total occlusion of a small obtuse marginal branch. 4. Mild to moderate left anterior descending stenosis.  RECOMMENDATIONS:  The patient's management is complicated by his need for vascular surgery.  Normally I would proceed with stenting of the right coronary artery and probably assessment of the distal left main proximal LAD with pressure wire analysis.  I am confident that there is not severe disease in the left coronary territory besides the small obtuse marginal which is totally occluded.  Considerations would be proceeding with surgery and treating the patient with aggressive medical therapy perioperatively and then treating the right coronary artery following surgery.  I think preoperative PCI is probably not a good option based on an increase in perioperative events in those patients. We will discuss further with Dr. Edilia Harvey and Dr. Eden Harvey.     Peter Fells. Excell Seltzer, MD     MDC/MEDQ  D:  09/09/2010  T:  09/09/2010  Job:  [id number]  cc:   Peter Pick. Eden Emms, MD,  [facility] Peter Harvey, M.D.  Electronically Signed by Tonny Bollman MD on 10/27/2010 07:32:53 AM

## 2010-10-31 NOTE — Telephone Encounter (Signed)
FAXED TELEPHONE NOTE TO DR Rosezena Sensor OFFICE./CY

## 2010-11-11 ENCOUNTER — Ambulatory Visit (INDEPENDENT_AMBULATORY_CARE_PROVIDER_SITE_OTHER): Payer: Medicare Other | Admitting: Surgery

## 2010-11-11 ENCOUNTER — Encounter (INDEPENDENT_AMBULATORY_CARE_PROVIDER_SITE_OTHER): Payer: Self-pay | Admitting: Surgery

## 2010-11-11 DIAGNOSIS — C189 Malignant neoplasm of colon, unspecified: Secondary | ICD-10-CM

## 2010-11-11 NOTE — Progress Notes (Signed)
Subjective:     Patient ID: Peter Harvey, male   DOB: June 17, 1940, 70 y.o.   MRN: [id number]    There were no vitals taken for this visit.    HPI The patient returns to clinic today to discuss surgical options. He has an adenocarcinoma of the cecum. He has multiple medical problems including significant cardiac issues cardiologist is evaluated him and felt he is high risk for cardiac events related to surgery. His sons are here today with him. He feels okay.   Review of Systems negative     Objective:   Physical Exam  Not repeated     Assessment:    Adenocarcinoma of the cecum Plan:    I had a very long discussion with the patient's family today. He is a high operative risk for complications. I discussed risk of bleeding, infection, anastomotic leakage, rupture of his aneurysms, myocardial infarction exacerbation of his underlying heart disease, stroke, poor wound healing, significant reduction in his physical abilities, pulmonary complications, sepsis, and death. He is extremely poor physical condition for surgery but this would be his only chance for cure. There's no signs of any metastatic disease. Chemotherapy would be an alternative surgery but this could not be a first line of treatment. Physically, he would not tolerate any chemotherapy any way. The other alternative would be to do nothing. I'm afraid he would develop a bowel structure name bleeding from this tumor eventually and this would prevent his other medical problems being addressed. He would like to proceed with surgical excision of his cecal adenocarcinoma. This would be done laparoscopic assisted.The procedure was discussed with the patient.  Laparoscopic partial colectomy discussed with the patient as well as non operative treatments. The risks of operative management include bleeding,  Infection,  Leak of anastamosis,  Ostomy formation, open procedure,  Sepsis,  Abcess,  Hernia,  DVT,  Pulmonary complications,   Cardiovascular  complications,  Injury to ureter,  Bladder,kidney,and anesthesia risks. The patient understands.  Questions answered.  They agree to proceed.

## 2010-11-11 NOTE — Patient Instructions (Signed)
You will be scheduled for surgery.

## 2010-11-16 ENCOUNTER — Ambulatory Visit (INDEPENDENT_AMBULATORY_CARE_PROVIDER_SITE_OTHER): Payer: Medicare Other | Admitting: Vascular Surgery

## 2010-11-16 DIAGNOSIS — I739 Peripheral vascular disease, unspecified: Secondary | ICD-10-CM

## 2010-11-16 DIAGNOSIS — I714 Abdominal aortic aneurysm, without rupture: Secondary | ICD-10-CM

## 2010-11-16 NOTE — Assessment & Plan Note (Signed)
OFFICE VISIT  Peter Harvey, Peter Harvey DOB:  11-May-1940                                       11/16/2010 CHART#:[id number]  I saw patient in the office today for continued follow-up of his abdominal aortic aneurysm, bilateral iliac artery aneurysms, and bilateral femoral artery aneurysms.  In addition, he has infrainguinal arterial occlusive disease and dry gangrene of 2 toes on the right foot. He has a history of coronary artery disease, and as part of his preoperative workup, he underwent cardiac catheterization by Dr. Excell Seltzer which showed severe right coronary artery stenosis with tandem lesions in the mid and distal vessel.  In addition, he had a moderate distal left main stenosis with total occlusion of a small obtuse marginal branch.  Dr. Excell Seltzer felt that all things considered, it would be best to proceed with any surgery that was required and treat the patient with aggressive medical therapy perioperatively and then treat the right coronary artery stenosis following surgery.  This situation was further complicated by findings on CT scan of a possible cecal mass, and he underwent a biopsy Dr. Evette Cristal and was found to have an adenocarcinoma. He was evaluated by Dr. Luisa Hart, and he is scheduled for laparoscopic colon resection for this cecal cancer.  This is scheduled for August 6.  From my standpoint, he has an abdominal aortic aneurysm, bilateral common iliac artery aneurysms, bilateral femoral artery aneurysms, bilateral hypogastric artery occlusions, and bilateral superficial femoral artery occlusions.  Once he has recovered from his colon surgery, my plan would be to do an aortobiprofunda bypass and a right fem-pop bypass.  Since I saw him last, his daughter has been doing dressing changes to the toes on the right foot with dry gangrene and doing an excellent job. He has had no fever or chills.  He has had no significant right foot pain.  He has had no  abdominal or back pain.  REVIEW OF SYSTEMS:  CARDIOVASCULAR:  He has had no recent chest pain, chest pressure, palpitations or arrhythmias. PULMONARY:  He has had some recent bronchitis.  PHYSICAL EXAMINATION:  This is a pleasant 70 year old gentleman who appears his stated age.  Blood pressure is 211/74, heart rate is 55, temperature is 97.8.  Lungs are clear bilaterally to auscultation without rales, rhonchi or wheezing.  Cardiovascular:  He has a regular rate and rhythm.  He has palpable femoral pulses.  He has dry gangrene of the third and fourth toes of the right foot with no drainage.  I greatly appreciate Dr. Excell Seltzer, Dr. Evette Cristal, and Dr. Rosezena Sensor help with this complicated case.  I will plan on seeing him back in early September to see how he is doing after his colon resection.  I am somewhat concerned about his risk of healing, given his bilateral hypogastric artery occlusions, but there is really no other option. Certainly, we will need to get cardiology's help perioperatively when it comes time to fix his aneurysm.  Obviously, he is not a candidate for endovascular repair given his bilateral femoral artery aneurysms also.    Di Kindle. Edilia Bo, M.D. Electronically Signed  CSD/MEDQ  D:  11/16/2010  T:  11/16/2010  Job:  [id number]  cc:   Veverly Fells. Excell Seltzer, MD [place] Cornett, M.D. Graylin Shiver, M.D. Noralyn Pick. Eden Emms, MD, [facility]

## 2010-12-05 ENCOUNTER — Encounter: Payer: Self-pay | Admitting: Cardiovascular Disease

## 2010-12-05 ENCOUNTER — Telehealth: Payer: Self-pay | Admitting: Cardiovascular Disease

## 2010-12-05 ENCOUNTER — Ambulatory Visit (INDEPENDENT_AMBULATORY_CARE_PROVIDER_SITE_OTHER): Payer: Medicare Other | Admitting: Cardiovascular Disease

## 2010-12-05 ENCOUNTER — Encounter (HOSPITAL_COMMUNITY)
Admission: RE | Admit: 2010-12-05 | Discharge: 2010-12-05 | Disposition: A | Discharge status: Home / Self Care | Payer: Medicare Other | Source: Ambulatory Visit | Attending: Surgery | Admitting: Surgery

## 2010-12-05 ENCOUNTER — Telehealth (INDEPENDENT_AMBULATORY_CARE_PROVIDER_SITE_OTHER): Payer: Self-pay

## 2010-12-05 DIAGNOSIS — Z01818 Encounter for other preprocedural examination: Secondary | ICD-10-CM

## 2010-12-05 DIAGNOSIS — I714 Abdominal aortic aneurysm, without rupture: Secondary | ICD-10-CM

## 2010-12-05 DIAGNOSIS — I2510 Atherosclerotic heart disease of native coronary artery without angina pectoris: Secondary | ICD-10-CM

## 2010-12-05 DIAGNOSIS — I10 Essential (primary) hypertension: Secondary | ICD-10-CM

## 2010-12-05 LAB — COMPREHENSIVE METABOLIC PANEL
AST: 18 U/L (ref 0–37)
CO2: 34 mEq/L — ABNORMAL HIGH (ref 19–32)
Calcium: 10.3 mg/dL (ref 8.4–10.5)
Creatinine, Ser: 0.82 mg/dL (ref 0.50–1.35)
GFR calc Af Amer: >60 mL/min (ref >60)
GFR calc non Af Amer: >60 mL/min (ref >60)
Glucose, Bld: 73 mg/dL (ref 70–99)

## 2010-12-05 LAB — DIFFERENTIAL
Eosinophils Absolute: 0.1 10*3/uL (ref 0.0–0.7)
Lymphs Abs: 2 10*3/uL (ref 0.7–4.0)
Monocytes Relative: 8 % (ref 3–12)
Neutrophils Relative %: 65 % (ref 43–77)

## 2010-12-05 LAB — CBC
MCH: 28.2 pg (ref 26.0–34.0)
MCV: 86.7 fL (ref 78.0–100.0)
Platelets: 188 10*3/uL (ref 150–400)
RBC: 5.49 MIL/uL (ref 4.22–5.81)

## 2010-12-05 LAB — SURGICAL PCR SCREEN
MRSA, PCR: NEGATIVE
Staphylococcus aureus: NEGATIVE

## 2010-12-05 NOTE — Patient Instructions (Signed)
Your physician recommends that you schedule a follow-up appointment in: SEPT WITH DR [facility]  Your physician recommends that you continue on your current medications as directed. Please refer to the Current Medication list given to you today.

## 2010-12-05 NOTE — Telephone Encounter (Signed)
LOV,Stress faxed to [facility] @  [phone]  12/05/10/km

## 2010-12-05 NOTE — Assessment & Plan Note (Signed)
I suspect his overall prognosis is poor enough where he will not end up getting stented.  However if he gets through his colon cancer surgery with no metastatic disease will discuss with Dr Excell Seltzer and Durwin Nora, if BMS of RCA is in order with aortobifem to follow in 3-6 months.  Not having angina but no activity and wheel chair bound

## 2010-12-05 NOTE — Assessment & Plan Note (Signed)
High risk for periop MI.  Proceed with surgery laproscopically if possible.  Continue beta blocker To be followed by [employer] [facility] post op.

## 2010-12-05 NOTE — Assessment & Plan Note (Signed)
Well controlled.  Continue current medications and low sodium Dash type diet.    

## 2010-12-05 NOTE — Telephone Encounter (Signed)
I received a call from Dr Fabio Bering office and he says to put on the patient's orders to contact [facility] po to follow pt

## 2010-12-05 NOTE — Progress Notes (Signed)
I saw patient in the office today for continued follow-up of his CAD. He is a very unfortunate 70 yo with adenocarcinoma of the colon.   This has been evaluated by Dr Luisa Hart and scheduled for lap colon resection 8/6.  He has significant CAD.  Reviewed recent cath by Dr Excell Seltzer.    which showed severe right coronary artery stenosis with tandem lesions   in the mid and distal vessel.  In addition, he had a moderate distal   left main stenosis with total occlusion of a small obtuse marginal   branch.  Dr. Excell Seltzer felt that all things considered, it would be best to   proceed with any surgery that was required and treat the patient with   aggressive medical therapy perioperatively and then treat the right   coronary artery stenosis following surgery.  I agree that since cancer Surgery needs to be done expeditiously to prevent spread a fresth stent Would likely be more likely to cause an MI off antiplatlet Rx   He also has severe PVD with aneurysms followed by Dr Durwin Nora:   abdominal aortic aneurysm, bilateral iliac artery aneurysms, and   bilateral femoral artery aneurysms.  In addition, he has infrainguinal   arterial occlusive disease and dry gangrene of 2 toes on the right foot.  Once he has recovered from his colon surgery  we will have to figure out if stenting of the RCA and staged 6 month Aortobifemoral bypass Would be in order  Of course this all depends on his cancer prognosis  He is on Atenolol with a low resting HR and knows to take it the morning Of surgery.  We have contacted the cardmaster and Dr Cornett's office To notify us post op so we can follow him on telemetry  Unfortuantaly with all these issues his prognosis is poor  ROS: Denies fever, malais, weight loss, blurry vision, decreased visual acuity, cough, sputum, SOB, hemoptysis, pleuritic pain, palpitaitons, heartburn, abdominal pain, melena, lower extremity edema, , or rash.  All other systems reviewed and  negative  General: Affect appropriate Chronically ill in wheel chair HEENT: normal Neck supple with no adenopathy JVP normal left  bruits no thyromegaly Lungs clear with no wheezing and good diaphragmatic motion Heart:  S1/S2 sytoloc  murmur,rub, gallop or click PMI normal Abdomen: benighn, BS positve, no tenderness, no AAA Bilateral femoral  bruit.  No HSM or HJR Distal pulses diminished and hard to feel Plus one bilateral  Neuro non-focal Dry gangreen two toes on right foot with dependant rubor  No muscular weakness   Current Outpatient Prescriptions  Medication Sig Dispense Refill  . aspirin 81 MG EC tablet Take 81 mg by mouth daily.        [name] atenolol (TENORMIN) 50 MG tablet Take 50 mg by mouth daily.        . cephALEXin (KEFLEX) 500 MG capsule Take 500 mg by mouth 3 (three) times daily.        [name] glyBURIDE-metformin (GLUCOVANCE) 5-500 MG per tablet Take 1 tablet by mouth daily with breakfast.        . levothyroxine (SYNTHROID, LEVOTHROID) 100 MCG tablet Take 100 mcg by mouth daily.        [name] oxyCODONE-acetaminophen (PERCOCET) 5-325 MG per tablet Take 1 tablet by mouth every 4 (four) hours as needed.        . quinapril (ACCUPRIL) 20 MG tablet Take 20 mg by mouth at bedtime.          Allergies  Review of patient's allergies indicates no known allergies.  Electrocardiogram:  Assessment and Plan

## 2010-12-05 NOTE — Assessment & Plan Note (Signed)
No pain.  Dry gangrene on foot stable.  F/U wouldn care center.  Revascularization will have to wait until cancer surgery and CAD taken care of.  Likely added embolic insult to small arteries from aneurysms

## 2010-12-12 ENCOUNTER — Inpatient Hospital Stay (HOSPITAL_COMMUNITY)
Admission: RE | Admit: 2010-12-12 | Discharge: 2010-12-22 | DRG: 329 | Disposition: A | Discharge status: Skilled Nursing Facility | Payer: Medicare Other | Source: Ambulatory Visit | Attending: Surgery | Admitting: Surgery

## 2010-12-12 ENCOUNTER — Telehealth (INDEPENDENT_AMBULATORY_CARE_PROVIDER_SITE_OTHER): Payer: Self-pay | Admitting: General Surgery

## 2010-12-12 ENCOUNTER — Other Ambulatory Visit (INDEPENDENT_AMBULATORY_CARE_PROVIDER_SITE_OTHER): Payer: Self-pay | Admitting: Surgery

## 2010-12-12 DIAGNOSIS — R5381 Other malaise: Secondary | ICD-10-CM | POA: Diagnosis present

## 2010-12-12 DIAGNOSIS — Z01810 Encounter for preprocedural cardiovascular examination: Secondary | ICD-10-CM

## 2010-12-12 DIAGNOSIS — Z7982 Long term (current) use of aspirin: Secondary | ICD-10-CM

## 2010-12-12 DIAGNOSIS — J9600 Acute respiratory failure, unspecified whether with hypoxia or hypercapnia: Secondary | ICD-10-CM | POA: Diagnosis not present

## 2010-12-12 DIAGNOSIS — I714 Abdominal aortic aneurysm, without rupture, unspecified: Secondary | ICD-10-CM | POA: Diagnosis present

## 2010-12-12 DIAGNOSIS — Y9210 Unspecified residential institution as the place of occurrence of the external cause: Secondary | ICD-10-CM | POA: Diagnosis not present

## 2010-12-12 DIAGNOSIS — I10 Essential (primary) hypertension: Secondary | ICD-10-CM | POA: Diagnosis present

## 2010-12-12 DIAGNOSIS — J449 Chronic obstructive pulmonary disease, unspecified: Secondary | ICD-10-CM | POA: Diagnosis present

## 2010-12-12 DIAGNOSIS — F17200 Nicotine dependence, unspecified, uncomplicated: Secondary | ICD-10-CM | POA: Diagnosis present

## 2010-12-12 DIAGNOSIS — C189 Malignant neoplasm of colon, unspecified: Secondary | ICD-10-CM

## 2010-12-12 DIAGNOSIS — E785 Hyperlipidemia, unspecified: Secondary | ICD-10-CM | POA: Diagnosis present

## 2010-12-12 DIAGNOSIS — I2510 Atherosclerotic heart disease of native coronary artery without angina pectoris: Secondary | ICD-10-CM | POA: Diagnosis present

## 2010-12-12 DIAGNOSIS — J4489 Other specified chronic obstructive pulmonary disease: Secondary | ICD-10-CM | POA: Diagnosis present

## 2010-12-12 DIAGNOSIS — D696 Thrombocytopenia, unspecified: Secondary | ICD-10-CM | POA: Diagnosis not present

## 2010-12-12 DIAGNOSIS — R404 Transient alteration of awareness: Secondary | ICD-10-CM | POA: Diagnosis not present

## 2010-12-12 DIAGNOSIS — D62 Acute posthemorrhagic anemia: Secondary | ICD-10-CM | POA: Diagnosis not present

## 2010-12-12 DIAGNOSIS — Z01812 Encounter for preprocedural laboratory examination: Secondary | ICD-10-CM

## 2010-12-12 DIAGNOSIS — E875 Hyperkalemia: Secondary | ICD-10-CM | POA: Diagnosis not present

## 2010-12-12 DIAGNOSIS — E119 Type 2 diabetes mellitus without complications: Secondary | ICD-10-CM | POA: Diagnosis present

## 2010-12-12 DIAGNOSIS — E039 Hypothyroidism, unspecified: Secondary | ICD-10-CM | POA: Diagnosis not present

## 2010-12-12 DIAGNOSIS — I96 Gangrene, not elsewhere classified: Secondary | ICD-10-CM | POA: Diagnosis not present

## 2010-12-12 DIAGNOSIS — C180 Malignant neoplasm of cecum: Principal | ICD-10-CM | POA: Diagnosis present

## 2010-12-12 DIAGNOSIS — IMO0002 Reserved for concepts with insufficient information to code with codable children: Secondary | ICD-10-CM | POA: Diagnosis not present

## 2010-12-12 DIAGNOSIS — Y832 Surgical operation with anastomosis, bypass or graft as the cause of abnormal reaction of the patient, or of later complication, without mention of misadventure at the time of the procedure: Secondary | ICD-10-CM | POA: Diagnosis not present

## 2010-12-12 DIAGNOSIS — F0390 Unspecified dementia without behavioral disturbance: Secondary | ICD-10-CM | POA: Diagnosis present

## 2010-12-12 HISTORY — PX: COLON SURGERY: SHX602

## 2010-12-12 LAB — BASIC METABOLIC PANEL
BUN: 20 mg/dL (ref 6–23)
CO2: 26 mEq/L (ref 19–32)
Calcium: 8.2 mg/dL — ABNORMAL LOW (ref 8.4–10.5)
Creatinine, Ser: 0.83 mg/dL (ref 0.50–1.35)
Glucose, Bld: 197 mg/dL — ABNORMAL HIGH (ref 70–99)

## 2010-12-12 LAB — HEMOGLOBIN A1C
Hgb A1c MFr Bld: 6.5 % — ABNORMAL HIGH (ref <5.7)
Mean Plasma Glucose: 140 mg/dL — ABNORMAL HIGH (ref <117)

## 2010-12-12 LAB — GLUCOSE, CAPILLARY
Glucose-Capillary: 95 mg/dL (ref 70–99)
Glucose-Capillary: 177 mg/dL — ABNORMAL HIGH (ref 70–99)

## 2010-12-12 NOTE — Telephone Encounter (Signed)
Per Dr Luisa Hart called consult for [facility]. I spoke with their office and paged them at [phone] and spoke to [place]. Made aware Peter Harvey needs followed post operatively for his coronary artery disease. He is a patient of Dr Eden Emms. They will follow.

## 2010-12-13 LAB — CARDIAC PANEL(CRET KIN+CKTOT+MB+TROPI)
CK, MB: 4 ng/mL (ref 0.3–4.0)
Relative Index: 0.9 (ref 0.0–2.5)
Troponin I: <0.3 ng/mL (ref <0.30)

## 2010-12-13 LAB — CBC
HCT: 19.3 % — ABNORMAL LOW (ref 39.0–52.0)
HCT: 25.7 % — ABNORMAL LOW (ref 39.0–52.0)
HCT: 25.9 % — ABNORMAL LOW (ref 39.0–52.0)
HCT: 27.4 % — ABNORMAL LOW (ref 39.0–52.0)
Hemoglobin: 9.3 g/dL — ABNORMAL LOW (ref 13.0–17.0)
Hemoglobin: 8.8 g/dL — ABNORMAL LOW (ref 13.0–17.0)
MCHC: 34.2 g/dL (ref 30.0–36.0)
MCV: 84.8 fL (ref 78.0–100.0)
MCV: 86.6 fL (ref 78.0–100.0)
MCV: 86.9 fL (ref 78.0–100.0)
Platelets: 157 10*3/uL (ref 150–400)
Platelets: 183 10*3/uL (ref 150–400)
RBC: 2.22 MIL/uL — ABNORMAL LOW (ref 4.22–5.81)
RBC: 3.21 MIL/uL — ABNORMAL LOW (ref 4.22–5.81)
RDW: 14.8 % (ref 11.5–15.5)
WBC: 10.9 10*3/uL — ABNORMAL HIGH (ref 4.0–10.5)
WBC: 10.3 10*3/uL (ref 4.0–10.5)

## 2010-12-13 LAB — GLUCOSE, CAPILLARY
Glucose-Capillary: 239 mg/dL — ABNORMAL HIGH (ref 70–99)
Glucose-Capillary: 286 mg/dL — ABNORMAL HIGH (ref 70–99)
Glucose-Capillary: 211 mg/dL — ABNORMAL HIGH (ref 70–99)
Glucose-Capillary: 151 mg/dL — ABNORMAL HIGH (ref 70–99)
Glucose-Capillary: 187 mg/dL — ABNORMAL HIGH (ref 70–99)

## 2010-12-13 LAB — BASIC METABOLIC PANEL
BUN: 30 mg/dL — ABNORMAL HIGH (ref 6–23)
CO2: 25 mEq/L (ref 19–32)
Chloride: 106 mEq/L (ref 96–112)
GFR calc non Af Amer: 51 mL/min — ABNORMAL LOW (ref >60)
Glucose, Bld: 333 mg/dL — ABNORMAL HIGH (ref 70–99)
Potassium: 4.4 mEq/L (ref 3.5–5.1)

## 2010-12-13 LAB — PROTIME-INR: INR: 1.16 (ref 0.00–1.49)

## 2010-12-13 LAB — PREPARE RBC (CROSSMATCH)

## 2010-12-13 NOTE — Op Note (Signed)
NAME:  ANTAR, MILKS NO.:  [id number]  MEDICAL RECORD NO.:  [id number]  LOCATION:  3311                         FACILITY:  [facility]  PHYSICIAN:  Maisie Fus A. Angelita Harnack, M.D.DATE OF BIRTH:  08-Apr-1941  DATE OF PROCEDURE:  12/12/2010 DATE OF DISCHARGE:                              OPERATIVE REPORT   PREOPERATIVE DIAGNOSIS:  Cecal carcinoma of the cecum.  POSTOPERATIVE DIAGNOSIS:  Cecal carcinoma of the cecum.  PROCEDURE:  Laparoscopic-assisted right hemicolectomy.  SURGEON:  Maisie Fus A. Ajit Errico, MD.  Assistant: Consuello Bossier, MD.  ANESTHESIA:  General endotracheal anesthesia with 0.25% Sensorcaine local with epinephrine.  ESTIMATED BLOOD LOSS:  Less than 50 mL.  SPECIMEN:  Right colon with terminal ileum and tumor to Pathology.  DRAINS:  None.  INDICATIONS FOR PROCEDURE:  The patient is a 70 year old male with multiple medical problems.  He has severe coronary artery disease, hypertension, severe COPD, continued tobacco abuse, and a 5-cm abdominal aortic aneurysm.  He is physically quite deconditioned.  He needs to have his abdominal aortic aneurysm repair.  He was sent for Cardiology evaluation and they felt he was extremely high-risk operative candidate. I had a long discussion with the patient and his family in my office.  I outlined the procedure to them.  I went over alternatives to surgery as well, but did not feel these would be of any benefit and I did not feel he can tolerate the alternative of surgery, which will be chemotherapy. This was a large tumor and was potentially going to be obstructing and bleeding at some point.  I explained that he is at high operative risk for myocardial infarction, stroke, respiratory failure being on the ventilator, dying from procedure.  This was a significant risk and I talked to him about that with his family at the office.  I explained that this would be his only alternative.  The other alternative  besides surgery will be to do nothing and succumb to either his cardiopulmonary issues or to his cancer.  We discussed all this in great length in the office.  He understood the potential risks he was going to undertake, but felt this was his best option of getting through this.  They all agreed to proceed with surgery after a lengthy discussion about the above.  DESCRIPTION OF PROCEDURE:  The patient was brought to the operating room.  After induction of general anesthesia, the abdomen was prepped and draped in sterile fashion.  Foley catheter was placed under sterile conditions.  Time-out was done.  He received Invanz 1 g.  A 5-mm incision was made in the left upper quadrant.  OptiVu was used and under direct guidance, the OptiVu was passed through the abdominal wall layers into the abdominal cavity without difficulty.  We insufflated the 15 mmHg CO2 without difficulty.  He tolerated this fairly well.  We placed two other 5-mm ports, one on his left lower quadrant, the other in his right midabdomen.  At this point in time, we identified the tumor, easily seen in the cecum, was already tattooed.  I then mobilized the right colon along the white line of Toldt using the harmonic scalpel up to the hepatic flexure.  I then took  the hepatic flexure down with a harmonic scalpel just distal to the gallbladder.  The gallbladder was distended but not inflamed.  There was no evidence of any liver disease. Once I did this, it was very mobile.  I then decided to put extraction port site in his right lower quadrant.  This seemed to fit him best.  I then made about an 8 mm incision just to the right of his umbilicus. Dissection was carried down and we went through the anterior sheath of the rectus abdominis muscle through the muscle itself with cautery and then opened the posterior sheath entering the abdominal cavity without difficulty.  I was able to easily grab the cecum and pull it out  through this after placing the wound protector.  We brought up the entire specimen, did a little bit more mobilization with cautery.  We then took the mesentery down just at the clear plane at the hepatic flexure of the colon and then used the LigaSure 2-0 and 3-0 Vicryl ties taking mesentery down to just to the terminal ileum.  Once we did all this, we then created anastomosis using a GIA 75 stapling device.  This was done after making two small enterotomies in the terminal ileum and in the transverse colon on the tenia.  After this was done, we fired the stapler.  There was good hemostasis.  Then, we used a GIA 60 to go ahead and close the common enterotomy and also to resect the specimen.  This was done easily.  The common defect in the mesentery was closed with 2-0 Vicryl.  Specimen was sent to Pathology.  Anastomosis was widely patent. There was no tension.  A single crotch stitch was placed to take tension off the staple line.  After this was all done, there was little bit of oozing from the staple line, but had good blood flow.  This was dropped back into the abdominal cavity.  We looked at it and there was no twisting of the small bowel or mesentery and everything seemed to line quite nicely.  We then irrigated out the abdominal cavity.  I then closed the abdominal wound with two layers of #1 PDS.  Skin was irrigated and closed with staples.  I then reinsufflated the abdominal cavity, examined the abdominal cavity, and saw no excessive bleeding. There was little bit of fluid, which was suctioned out.  He did not tolerate this very well, so I did a quick inspection, saw no evidence of any bowel injury or stool or otherwise bleeding.  I then removed my ports decompressing the pneumoperitoneum quickly.  His vital signs actually improved upon doing this.  We then closed the skin incisions with staples.  All final counts of sponge and instruments were found to be correct at this  portion of the case.  The patient was awoke, extubated, and taken to the recovery in satisfactory condition.     Brelyn Woehl A. Arie Gable, M.D.     TAC/MEDQ  D:  12/12/2010  T:  12/12/2010  Job:  [id number]  cc:   Graylin Shiver, M.D. Noralyn Pick. Eden Emms, MD, [facility]  Electronically Signed by Harriette Bouillon M.D. on 12/13/2010 08:11:19 AM

## 2010-12-14 ENCOUNTER — Inpatient Hospital Stay (HOSPITAL_COMMUNITY): Payer: Medicare Other

## 2010-12-14 ENCOUNTER — Other Ambulatory Visit (INDEPENDENT_AMBULATORY_CARE_PROVIDER_SITE_OTHER): Payer: Self-pay | Admitting: Surgery

## 2010-12-14 DIAGNOSIS — K929 Disease of digestive system, unspecified: Secondary | ICD-10-CM

## 2010-12-14 DIAGNOSIS — R578 Other shock: Secondary | ICD-10-CM

## 2010-12-14 DIAGNOSIS — J9600 Acute respiratory failure, unspecified whether with hypoxia or hypercapnia: Secondary | ICD-10-CM

## 2010-12-14 LAB — COMPREHENSIVE METABOLIC PANEL
AST: 29 U/L (ref 0–37)
Albumin: 2.4 g/dL — ABNORMAL LOW (ref 3.5–5.2)
Chloride: 103 mEq/L (ref 96–112)
Creatinine, Ser: 0.97 mg/dL (ref 0.50–1.35)
Potassium: 3.3 mEq/L — ABNORMAL LOW (ref 3.5–5.1)
Total Bilirubin: 0.5 mg/dL (ref 0.3–1.2)
Total Protein: 4.6 g/dL — ABNORMAL LOW (ref 6.0–8.3)

## 2010-12-14 LAB — POCT I-STAT 3, ART BLOOD GAS (G3+)
Acid-Base Excess: 10 mmol/L — ABNORMAL HIGH (ref 0.0–2.0)
Bicarbonate: 31.8 mEq/L — ABNORMAL HIGH (ref 20.0–24.0)
O2 Saturation: 81 %
Patient temperature: 99.5
Patient temperature: 101.3
TCO2: 33 mmol/L (ref 0–100)
pCO2 arterial: 50.8 mmHg — ABNORMAL HIGH (ref 35.0–45.0)
pH, Arterial: 7.406 (ref 7.350–7.450)
pH, Arterial: 7.444 (ref 7.350–7.450)

## 2010-12-14 LAB — CBC
Hemoglobin: 9.1 g/dL — ABNORMAL LOW (ref 13.0–17.0)
MCH: 29.6 pg (ref 26.0–34.0)
MCH: 29.4 pg (ref 26.0–34.0)
MCHC: 34.2 g/dL (ref 30.0–36.0)
MCHC: 33.9 g/dL (ref 30.0–36.0)
MCHC: 34.6 g/dL (ref 30.0–36.0)
MCV: 84.4 fL (ref 78.0–100.0)
MCV: 85.5 fL (ref 78.0–100.0)
MCV: 86.1 fL (ref 78.0–100.0)
Platelets: 84 10*3/uL — ABNORMAL LOW (ref 150–400)
Platelets: 85 10*3/uL — ABNORMAL LOW (ref 150–400)
Platelets: 73 10*3/uL — ABNORMAL LOW (ref 150–400)
RBC: 3.09 MIL/uL — ABNORMAL LOW (ref 4.22–5.81)
RDW: 15 % (ref 11.5–15.5)
RDW: 15.2 % (ref 11.5–15.5)
RDW: 15.5 % (ref 11.5–15.5)
WBC: 6.6 10*3/uL (ref 4.0–10.5)
WBC: 3.9 10*3/uL — ABNORMAL LOW (ref 4.0–10.5)

## 2010-12-14 LAB — BASIC METABOLIC PANEL
BUN: 24 mg/dL — ABNORMAL HIGH (ref 6–23)
CO2: 32 mEq/L (ref 19–32)
Calcium: 7.6 mg/dL — ABNORMAL LOW (ref 8.4–10.5)
GFR calc non Af Amer: >60 mL/min (ref >60)
Glucose, Bld: 185 mg/dL — ABNORMAL HIGH (ref 70–99)

## 2010-12-14 LAB — PREPARE FRESH FROZEN PLASMA
Unit division: 0
Unit division: 0

## 2010-12-14 LAB — GLUCOSE, CAPILLARY
Glucose-Capillary: 100 mg/dL — ABNORMAL HIGH (ref 70–99)
Glucose-Capillary: 136 mg/dL — ABNORMAL HIGH (ref 70–99)
Glucose-Capillary: 167 mg/dL — ABNORMAL HIGH (ref 70–99)
Glucose-Capillary: 167 mg/dL — ABNORMAL HIGH (ref 70–99)

## 2010-12-14 LAB — CARDIAC PANEL(CRET KIN+CKTOT+MB+TROPI)
CK, MB: 3.6 ng/mL (ref 0.3–4.0)
CK, MB: 2.9 ng/mL (ref 0.3–4.0)
Relative Index: 0.8 (ref 0.0–2.5)
Relative Index: 1.2 (ref 0.0–2.5)
Total CK: 297 U/L — ABNORMAL HIGH (ref 7–232)
Troponin I: <0.3 ng/mL (ref <0.30)
Troponin I: 0.62 ng/mL (ref <0.30)

## 2010-12-14 LAB — LACTIC ACID, PLASMA: Lactic Acid, Venous: 1 mmol/L (ref 0.5–2.2)

## 2010-12-14 LAB — PHOSPHORUS: Phosphorus: 1.9 mg/dL — ABNORMAL LOW (ref 2.3–4.6)

## 2010-12-15 ENCOUNTER — Inpatient Hospital Stay (HOSPITAL_COMMUNITY): Payer: Medicare Other

## 2010-12-15 DIAGNOSIS — R578 Other shock: Secondary | ICD-10-CM

## 2010-12-15 DIAGNOSIS — J95821 Acute postprocedural respiratory failure: Secondary | ICD-10-CM

## 2010-12-15 DIAGNOSIS — Z9911 Dependence on respirator [ventilator] status: Secondary | ICD-10-CM

## 2010-12-15 LAB — GLUCOSE, CAPILLARY
Glucose-Capillary: 132 mg/dL — ABNORMAL HIGH (ref 70–99)
Glucose-Capillary: 116 mg/dL — ABNORMAL HIGH (ref 70–99)

## 2010-12-15 LAB — POCT I-STAT 3, ART BLOOD GAS (G3+)
pCO2 arterial: 43.4 mmHg (ref 35.0–45.0)
pH, Arterial: 7.433 (ref 7.350–7.450)
pO2, Arterial: 110 mmHg — ABNORMAL HIGH (ref 80.0–100.0)

## 2010-12-15 LAB — POCT I-STAT 7, (LYTES, BLD GAS, ICA,H+H)
Acid-Base Excess: 9 mmol/L — ABNORMAL HIGH (ref 0.0–2.0)
Bicarbonate: 33.7 mEq/L — ABNORMAL HIGH (ref 20.0–24.0)
HCT: 27 % — ABNORMAL LOW (ref 39.0–52.0)
Hemoglobin: 9.2 g/dL — ABNORMAL LOW (ref 13.0–17.0)
Patient temperature: 37.1
pCO2 arterial: 48.6 mmHg — ABNORMAL HIGH (ref 35.0–45.0)
pO2, Arterial: 398 mmHg — ABNORMAL HIGH (ref 80.0–100.0)

## 2010-12-15 LAB — COMPREHENSIVE METABOLIC PANEL
ALT: 12 U/L (ref 0–53)
AST: 22 U/L (ref 0–37)
Alkaline Phosphatase: 60 U/L (ref 39–117)
CO2: 30 mEq/L (ref 19–32)
GFR calc Af Amer: >60 mL/min (ref >60)
Glucose, Bld: 146 mg/dL — ABNORMAL HIGH (ref 70–99)
Potassium: 3.6 mEq/L (ref 3.5–5.1)
Sodium: 139 mEq/L (ref 135–145)
Total Protein: 4.8 g/dL — ABNORMAL LOW (ref 6.0–8.3)

## 2010-12-15 LAB — CULTURE, BAL-QUANTITATIVE W GRAM STAIN

## 2010-12-15 LAB — POCT I-STAT GLUCOSE: Operator id: 235491

## 2010-12-15 LAB — CBC
Hemoglobin: 10.5 g/dL — ABNORMAL LOW (ref 13.0–17.0)
RBC: 3.56 MIL/uL — ABNORMAL LOW (ref 4.22–5.81)

## 2010-12-15 LAB — CARDIAC PANEL(CRET KIN+CKTOT+MB+TROPI)
Relative Index: 1.3 (ref 0.0–2.5)
Troponin I: 0.4 ng/mL (ref <0.30)

## 2010-12-16 LAB — CBC
MCHC: 33.8 g/dL (ref 30.0–36.0)
Platelets: 111 10*3/uL — ABNORMAL LOW (ref 150–400)
RDW: 15 % (ref 11.5–15.5)
WBC: 4.7 10*3/uL (ref 4.0–10.5)

## 2010-12-16 LAB — CROSSMATCH
Unit division: 0
Unit division: 0
Unit division: 0
Unit division: 0
Unit division: 0

## 2010-12-16 LAB — GLUCOSE, CAPILLARY
Glucose-Capillary: 117 mg/dL — ABNORMAL HIGH (ref 70–99)
Glucose-Capillary: 121 mg/dL — ABNORMAL HIGH (ref 70–99)
Glucose-Capillary: 130 mg/dL — ABNORMAL HIGH (ref 70–99)
Glucose-Capillary: 149 mg/dL — ABNORMAL HIGH (ref 70–99)
Glucose-Capillary: 89 mg/dL (ref 70–99)

## 2010-12-16 LAB — POCT I-STAT, CHEM 8
Calcium, Ion: 1.15 mmol/L (ref 1.12–1.32)
Chloride: 103 mEq/L (ref 96–112)
Creatinine, Ser: 1 mg/dL (ref 0.50–1.35)
Glucose, Bld: 112 mg/dL — ABNORMAL HIGH (ref 70–99)
HCT: 28 % — ABNORMAL LOW (ref 39.0–52.0)
Hemoglobin: 9.5 g/dL — ABNORMAL LOW (ref 13.0–17.0)
Potassium: 3.1 mEq/L — ABNORMAL LOW (ref 3.5–5.1)

## 2010-12-16 LAB — COMPREHENSIVE METABOLIC PANEL
AST: 20 U/L (ref 0–37)
Albumin: 2.1 g/dL — ABNORMAL LOW (ref 3.5–5.2)
Alkaline Phosphatase: 69 U/L (ref 39–117)
Chloride: 106 mEq/L (ref 96–112)
Potassium: 3.7 mEq/L (ref 3.5–5.1)
Sodium: 140 mEq/L (ref 135–145)
Total Bilirubin: 0.6 mg/dL (ref 0.3–1.2)
Total Protein: 5.1 g/dL — ABNORMAL LOW (ref 6.0–8.3)

## 2010-12-17 LAB — COMPREHENSIVE METABOLIC PANEL
ALT: 16 U/L (ref 0–53)
Albumin: 2.1 g/dL — ABNORMAL LOW (ref 3.5–5.2)
Alkaline Phosphatase: 70 U/L (ref 39–117)
Calcium: 8 mg/dL — ABNORMAL LOW (ref 8.4–10.5)
GFR calc Af Amer: >60 mL/min (ref >60)
Potassium: 3.6 mEq/L (ref 3.5–5.1)
Sodium: 139 mEq/L (ref 135–145)
Total Protein: 4.7 g/dL — ABNORMAL LOW (ref 6.0–8.3)

## 2010-12-17 LAB — CBC
HCT: 29.8 % — ABNORMAL LOW (ref 39.0–52.0)
Hemoglobin: 10.1 g/dL — ABNORMAL LOW (ref 13.0–17.0)
MCH: 29.2 pg (ref 26.0–34.0)
MCHC: 33.9 g/dL (ref 30.0–36.0)
RDW: 14.9 % (ref 11.5–15.5)

## 2010-12-17 LAB — GLUCOSE, CAPILLARY
Glucose-Capillary: 105 mg/dL — ABNORMAL HIGH (ref 70–99)
Glucose-Capillary: 128 mg/dL — ABNORMAL HIGH (ref 70–99)
Glucose-Capillary: 168 mg/dL — ABNORMAL HIGH (ref 70–99)
Glucose-Capillary: 123 mg/dL — ABNORMAL HIGH (ref 70–99)

## 2010-12-18 LAB — COMPREHENSIVE METABOLIC PANEL
ALT: 20 U/L (ref 0–53)
AST: 30 U/L (ref 0–37)
Albumin: 2.1 g/dL — ABNORMAL LOW (ref 3.5–5.2)
Alkaline Phosphatase: 87 U/L (ref 39–117)
BUN: 21 mg/dL (ref 6–23)
Chloride: 109 mEq/L (ref 96–112)
Potassium: 3.2 mEq/L — ABNORMAL LOW (ref 3.5–5.1)
Sodium: 144 mEq/L (ref 135–145)
Total Bilirubin: 0.6 mg/dL (ref 0.3–1.2)
Total Protein: 4.8 g/dL — ABNORMAL LOW (ref 6.0–8.3)

## 2010-12-18 LAB — CBC
HCT: 30.9 % — ABNORMAL LOW (ref 39.0–52.0)
MCHC: 33 g/dL (ref 30.0–36.0)
Platelets: 124 10*3/uL — ABNORMAL LOW (ref 150–400)
RDW: 15 % (ref 11.5–15.5)
WBC: 7.8 10*3/uL (ref 4.0–10.5)

## 2010-12-18 LAB — GLUCOSE, CAPILLARY: Glucose-Capillary: 129 mg/dL — ABNORMAL HIGH (ref 70–99)

## 2010-12-19 DIAGNOSIS — G92 Toxic encephalopathy: Secondary | ICD-10-CM

## 2010-12-19 DIAGNOSIS — G929 Unspecified toxic encephalopathy: Secondary | ICD-10-CM

## 2010-12-19 DIAGNOSIS — R5381 Other malaise: Secondary | ICD-10-CM

## 2010-12-19 DIAGNOSIS — C189 Malignant neoplasm of colon, unspecified: Secondary | ICD-10-CM

## 2010-12-19 LAB — GLUCOSE, CAPILLARY
Glucose-Capillary: 220 mg/dL — ABNORMAL HIGH (ref 70–99)
Glucose-Capillary: 189 mg/dL — ABNORMAL HIGH (ref 70–99)
Glucose-Capillary: 185 mg/dL — ABNORMAL HIGH (ref 70–99)

## 2010-12-19 LAB — BASIC METABOLIC PANEL
CO2: 30 mEq/L (ref 19–32)
Calcium: 8.3 mg/dL — ABNORMAL LOW (ref 8.4–10.5)
Glucose, Bld: 200 mg/dL — ABNORMAL HIGH (ref 70–99)
Potassium: 3.1 mEq/L — ABNORMAL LOW (ref 3.5–5.1)
Sodium: 143 mEq/L (ref 135–145)

## 2010-12-19 LAB — CBC
Hemoglobin: 11 g/dL — ABNORMAL LOW (ref 13.0–17.0)
MCH: 28.9 pg (ref 26.0–34.0)
Platelets: 166 10*3/uL (ref 150–400)
RBC: 3.81 MIL/uL — ABNORMAL LOW (ref 4.22–5.81)
WBC: 9.8 10*3/uL (ref 4.0–10.5)

## 2010-12-19 NOTE — Op Note (Signed)
NAME:  Peter Harvey, Peter Harvey NO.:  [id number]  MEDICAL RECORD NO.:  [id number]  LOCATION:  2309                         FACILITY:  [facility]  PHYSICIAN:  Maisie Fus A. Ednamae Schiano, M.D.DATE OF BIRTH:  07/22/1940  DATE OF PROCEDURE:  12/14/2010 DATE OF DISCHARGE:                              OPERATIVE REPORT   PREOPERATIVE DIAGNOSIS:  Acute blood loss anemia secondary to bleeding from the ileocolonic anastomosis.  POSTOPERATIVE DIAGNOSIS:  Acute blood loss anemia secondary to bleeding from the ileocolonic anastomosis.  PROCEDURE:  Exploratory laparotomy with revision of ileocolonic anastomosis.  SURGEON:  Maisie Fus A. Lavi Sheehan, MD  ANESTHESIA:  General endotracheal anesthesia.  ESTIMATED BLOOD LOSS:  300 mL.  SPECIMEN:  Ileocolonic anastomosis.  DRAINS:  None.  INDICATIONS FOR PROCEDURE:  The patient is a 70 year old male with multiple medical problems who had a right hemicolectomy 2 days ago due to a large cecal carcinoma.  He has severe coronary artery disease, hypertension, end-stage COPD, and continued tobacco abuse, as well as multiple abdominal and peripheral aneurysms.  He is a very poor operative candidate from the initial evaluation, but unfortunately, this is a large tumor and there was no other options available to him since the tumor was more and likely will going to lead obstruction before any of his other medical problems are may be the tissues.  We discussed this preoperatively.  Discussed his high risk for complications and for potential death from the operation which he understood.  This was discussed and he wished to proceed.  Two days ago, he underwent a right hemicolectomy.  About 12-16 hours postop, he began to develop blood in stool and lost about 400 mL of bloody stool.  His hemoglobin dropped from preop 15 down to 6.  He received 6 units of packed cells yesterday and throughout the night and has remained hemodynamically stable. Unfortunately, I am  unable to keep his hemoglobin at a stable level and it continuous to trip down ward despite transfusions and FFP.  He was intubated over night due to pulmonary insufficiency and I felt at this point time, his condition was deteriorating, where re-exploration is warrant.  I discussed this with the family at great length, explained the high risk of death in this setting given his multiple medical problems, but I felt that with that exploration he would inevitably die from hemorrhage.  They agreed to proceed.  DESCRIPTION OF PROCEDURE:  The patient was brought emergently to the operating room.  After induction of general anesthesia, the abdomen was prepped and draped in sterile fashion.  Time-out was done.  The right lower quadrant incision was opened, the staples were removed past into the field.  I entered the abdominal cavity.  There was minimal blood in the intra-abdominal cavity.  I was able to identify the anastomosis and pulled up back through the incision.  There was significant blood in a small bowel looked like hen colon.  Anastomosed, though it did not appear healthy to me but had not leaked.  I thought about opening and just oversewing versus resecting in all together and I felt the resection would be the simple thing to do, but also with minimal contamination as well.  Using a GIA  75 stapling device, I divided the ileum just proximal to the anastomosis.  I secondly used to fire the colon just distal to the anastomosis.  We then used LigaSure to take down the mesentery.  Anastomosis at its entirety.  I then created a side- to-side functional end-to-end anastomosis with a GIA 75 stapling device. Once we fired this, I was able to suctioned the blood out the bowel and examined the staple line.  There was no evidence of bleeding from the staple line after inspecting it for about 10 minutes.  I then elected to go ahead and close the common enterotomy with 3-0 Vicryl pop offs.   The lumen was widely patent.  Stitch was placed to the crotch anastomosis. I elected to leave the mesentery defect opened as this was quite large at this point in time given the second resection.  I then replaced the anastomosis back in the right upper quadrant.  There was no twisting of the small bowel upon examination of the anastomosis, gross tip of the small bowel and colon.  Irrigation of abdominal cavity was done.  There was some old blood, but there was minimal bleeding in the abdominal cavity.  I went ahead and closed the fascia with 2 layers of #1 running PDS suture.  I left the skin open at this point in time due to the contamination in the critical condition of the patient as well as multiple transfusions.  This was packed with saline-soaked Kerlix.  Dry dressings were applied.  All final counts of sponge, needle and instruments were found to be correct this portion of the case.  The patient was then taken to the ICU in critical condition.     Tanielle Emigh A. Mcclain Shall, M.D.     TAC/MEDQ  D:  12/14/2010  T:  12/14/2010  Job:  [id number]  Electronically Signed by Harriette Bouillon M.D. on 12/19/2010 09:16:29 AM

## 2010-12-19 NOTE — Consult Note (Addendum)
NAME:  Peter Harvey NO.:  [id number]  MEDICAL RECORD NO.:  [id number]  LOCATION:  3311                         FACILITY:  [facility]  PHYSICIAN:  Jesse Sans. Devante Capano, MD, FACCDATE OF BIRTH:  Sep 26, 1940  DATE OF CONSULTATION:  12/12/2010 DATE OF DISCHARGE:                                CONSULTATION   PRIMARY CARDIOLOGIST:  Theron Arista C. Eden Emms, MD, [facility]  PRIMARY MEDICAL DOCTOR:  Kirk Ruths, MD  CHIEF COMPLAINT:  Schedule surgery.  HISTORY OF PRESENT ILLNESS:  [name] is a 70 year old gentleman with recently diagnosed colon cancer who is status post laparoscopic partial right colectomy today.  He also has a history of coronary artery disease and peripheral vascular disease, for which intervention was deferred until colon cancer surgery had been performed along with staging.  The patient has a known 5.9 cm abdominal aortic aneurysm as well as severe mid distal RCA stenosis, moderate distal left main disease in the small portal OM.  He is maintained on medical therapy. We are asked to follow along with him postoperatively and adjust medicines as needed.  The patient is currently waking up from surgery and is still n.p.o.  The patient's blood pressure is currently soft in the 90s, but he received 20 mg of IV hydralazine due to high blood pressure postoperatively.  Of note, his potassium is also 5.2 preoperatively and his ACE inhibitor has been held.  The patient currently denies any chest pain, shortness of breath pain, or any other complaints.  PAST MEDICAL HISTORY: 1. Colon cancer, adenocarcinoma, status post surgery as above. 2. Peripheral vascular disease with 5.9-cm abdominal aortic aneurysm,     bilateral femoral artery aneurysms, bilateral SFA occlusions,     bilateral iliac artery aneurysm, bilateral hypogastric occluded     artery, dry gangrene of 2 toes in the right foot, with plan for     possible aorto bifem. 3. CAD with severe distal RCA mid  distal tandem stenosis, moderate     distal left main disease, totaled small OM, medical therapy until     postoperative stability is achieved. 4. Hyperlipidemia. 5. Hypertension. 6. Non-insulin-dependent diabetes mellitus.  INPATIENT MEDICATIONS: 1. Aspirin 18 mg daily. 2. Atenolol 50 mg daily. 3. D5W. 4. Lovenox 40 mg daily. 5. Invanz. 6. Dilaudid PCI. 7. Sliding scale insulin. 8. Levothyroxine 100 mcg daily.  ALLERGIES:  No known drug allergies.  SOCIAL HISTORY:  Peter Harvey lives in [place].  He has 3 children.  He has been a smoker for 40 years.  He denies any alcohol use.  FAMILY HISTORY:  Positive for MI in his father around age 92.  REVIEW OF SYSTEMS:  All other systems reviewed and otherwise negative.  LAB STUDIES:  None this admission.  Review of preop labs reveals a K of 5.2 and CO2 of 34, but otherwise labs are okay.  RADIOLOGIC STUDIES:  None.  PHYSICAL EXAMINATION:  VITAL SIGNS:  Temperature 97.3, pulse 66, respirations 13, blood pressure 124/50, pulse ox 92% on 4 liters. GENERAL:  This is a pleasant white male in no acute distress who is comfortable-appearing. HEENT:  Normocephalic, atraumatic.  Extraocular movements intact.  Clear sclerae.  Nares without discharge.  Neck is supple  without carotid bruit or JVD. HEART:  Auscultation of heart reveals regular rate and rhythm without murmurs, rubs, or gallops. LUNGS:  Clear to auscultation bilaterally without wheezes, rales, or rhonchi. ABDOMEN:  Soft, nontender with no rebound or guarding.  He has no bowel sounds. EXTREMITIES:  Warm and dry without edema, gangrenous toes on the right. NEUROLOGICALLY:  He is grossly intact and alert and oriented x3.  He is somewhat sleepy, but arousable and stays awake.  ASSESSMENT AND PLAN:  The patient was seen and examined by Dr. [name] and myself.  This is a 70 year old gentleman with a history of colon cancer who is status post partial colectomy, also with a history of  coronary artery disease and peripheral vascular disease, awaiting intervention. We are asked to follow along for medication adjustment.  Given his n.p.o., we will initiate IV beta blockade in the form of Lopressor 2.5 mg q.6 hours.  Especially, he has been on the soft side at present, but he received IV hydralazine postoperatively so we are starting with a lower dose of beta blockade.  He may transition back to his atenolol after he is taking p.o.'s again.  He also had mild hyperkalemia on his preop labs and will therefore check a BMET to ensure stability of his electrolytes. ACE inhibitor is on hold for now.  We will also initiate statin given his diffuse vascular disease.  We will continue to follow up you.  Thanks for the opportunity to participate in the care of this patient.     Dayna Dunn, P.A.C.   ______________________________ Jesse Sans [name], MD, [facility]    DD/MEDQ  D:  12/12/2010  T:  12/13/2010  Job:  [id number]  cc:   Noralyn Pick. Eden Emms, MD, [employer] [facility] Kirk Ruths, M.D. Dr. Kathlen Brunswick, M.D.  Electronically Signed by Ronie Spies  on 12/19/2010 01:22:02 PM Electronically Signed by Valera Castle MD [facility] on 12/21/2010 10:40:30 AM

## 2010-12-20 LAB — COMPREHENSIVE METABOLIC PANEL
AST: 21 U/L (ref 0–37)
Albumin: 2.1 g/dL — ABNORMAL LOW (ref 3.5–5.2)
BUN: 12 mg/dL (ref 6–23)
Creatinine, Ser: 0.65 mg/dL (ref 0.50–1.35)
Potassium: 3.3 mEq/L — ABNORMAL LOW (ref 3.5–5.1)
Total Protein: 5 g/dL — ABNORMAL LOW (ref 6.0–8.3)

## 2010-12-20 LAB — CBC
MCH: 29 pg (ref 26.0–34.0)
MCV: 88.2 fL (ref 78.0–100.0)
Platelets: 192 10*3/uL (ref 150–400)
RDW: 15.3 % (ref 11.5–15.5)
WBC: 9.2 10*3/uL (ref 4.0–10.5)

## 2010-12-20 LAB — GLUCOSE, CAPILLARY
Glucose-Capillary: 173 mg/dL — ABNORMAL HIGH (ref 70–99)
Glucose-Capillary: 263 mg/dL — ABNORMAL HIGH (ref 70–99)
Glucose-Capillary: 204 mg/dL — ABNORMAL HIGH (ref 70–99)
Glucose-Capillary: 205 mg/dL — ABNORMAL HIGH (ref 70–99)

## 2010-12-21 ENCOUNTER — Encounter (INDEPENDENT_AMBULATORY_CARE_PROVIDER_SITE_OTHER): Payer: Self-pay | Admitting: Surgery

## 2010-12-21 LAB — GLUCOSE, CAPILLARY: Glucose-Capillary: 191 mg/dL — ABNORMAL HIGH (ref 70–99)

## 2010-12-22 ENCOUNTER — Encounter: Payer: Self-pay | Admitting: Vascular Surgery

## 2010-12-22 LAB — GLUCOSE, CAPILLARY: Glucose-Capillary: 184 mg/dL — ABNORMAL HIGH (ref 70–99)

## 2010-12-26 NOTE — Discharge Summary (Signed)
NAME:  Peter Harvey, Peter Harvey NO.:  [id number]  MEDICAL RECORD NO.:  [id number]  LOCATION:  5120                         FACILITY:  [facility]  PHYSICIAN:  Maisie Fus A. Yenni Carra, M.D.DATE OF BIRTH:  1940-05-10  DATE OF ADMISSION:  12/12/2010 DATE OF DISCHARGE:  12/22/2010                              DISCHARGE SUMMARY   ADMITTING DIAGNOSES: 1. T3N0Mx right colon cancer. 2. Severe chronic obstructive pulmonary disease. 3. Severe coronary artery disease. 4. Severe peripheral vascular disease. 5. Diabetes mellitus type 2. 6. 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 7. Continued tobacco abuse.  DISCHARGE DIAGNOSES: 1. T3N0Mx right colon cancer. 2. Severe chronic obstructive pulmonary disease. 3. Severe coronary artery disease. 4. Severe peripheral vascular disease. 5. Diabetes mellitus type 2. 6. 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 7. Continued tobacco abuse. 8. 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  BRIEF HISTORY:  The patient is a severely debilitated 70 year old male with a large right colon cancer.  He was admitted preoperatively in my office and discuss the options of surgery with he and his family was done.  I explained he is extremely high risk of complication and death from procedure, but unfortunately he had no other options available for treatment of a very large right colon cancer.  He understood the potential risks and complications of the procedure and the potential risk that he would die from surgery and this was made very good the patient's family preoperatively.  They understood the above, and understood that he had no other viable option treatment and wished to proceed.  HOSPITAL COURSE:  The patient underwent laparoscopic-assisted right hemicolectomy on August 6.  Unfortunately, he was put in step-down and had some signs of anemia and bright red blood per rectum and stopped. His hemoglobin  continued to slowly drop down and I felt that reexploration was necessary.  He was taken back to the operating room on August 8 and underwent reexploration, was found to have anastomotic bleeding and his anastomosis was taken down and redone.  He require ICU stay after that for the next 5 days and he was intubated for two of those.  He was extubated relatively quickly.  He suffered from sundowning at night but that slowly improved.  He was then transferred to step-down for 3 days and improved slowly.  He began to partake in physical therapy, occupational therapy.  He had fair amount of blood that was passing which was old blood.  His hemoglobins remained stable around 10.  Overall, he slowly improved to began to tolerate regular diet and was tolerating that without difficulty.  His right lower quadrant wound was left opened after the second exploration and this remained clean and dressing changes were done.  He developed some dry gangrene of his right 3rd and 4th digits.  There were no signs of infection.  At this point in time, I felt that he was doing well enough, he can to go to a skilled nursing facility to complete his convalescences.  He was tolerating a diet, but having minimal pain.  His wounds were clean.  He has had no ongoing GI issues.  CONDITION ON DISCHARGE:  Stable.  DISCHARGE  MEDICATIONS:  He will go home on 1. Quinapril 20 mg a day. 2. Percocet 1-2 tablets q.4 p.r.n. pain. 3. Levothyroxine 100 mg p.o. daily. 4. Glyburide metformin 5/500 one tablet twice a day. 5. Atenolol 50 mg a day. 6. Enteric-coated aspirin 81 mg a day.  His diet will be soft diet.  His activity will be as tolerated.  He will participate with physical therapy and occupational therapy.  He will need wet-to-dry dressing changes to his right lower quadrant wound b.i.d..  He will follow up with me in 2-3 weeks.     Awa Bachicha A. Eura Radabaugh, M.D.     TAC/MEDQ  D:  12/21/2010  T:  12/21/2010  Job:   [id number]  Electronically Signed by Harriette Bouillon M.D. on 12/26/2010 07:31:14 AM

## 2011-01-10 ENCOUNTER — Encounter: Payer: Self-pay | Admitting: Vascular Surgery

## 2011-01-11 ENCOUNTER — Ambulatory Visit (INDEPENDENT_AMBULATORY_CARE_PROVIDER_SITE_OTHER): Payer: Medicare Other | Admitting: Vascular Surgery

## 2011-01-11 ENCOUNTER — Encounter: Payer: Self-pay | Admitting: Vascular Surgery

## 2011-01-11 VITALS — BP 209/95 | HR 54 | Resp 16 | Ht 70.5 in | Wt 157.6 lb

## 2011-01-11 DIAGNOSIS — I714 Abdominal aortic aneurysm, without rupture: Secondary | ICD-10-CM

## 2011-01-11 DIAGNOSIS — I70219 Atherosclerosis of native arteries of extremities with intermittent claudication, unspecified extremity: Secondary | ICD-10-CM

## 2011-01-11 NOTE — Progress Notes (Signed)
CC: AAA, bilateral iliac artery aneurysms and bilateral common femoral artery aneurysms.  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  HPI: Peter Harvey is a 70 y.o. male Is an extremely complicated patient with a multiple issues. Her my standpoint he would require aortobiiliac profundal bypass and a right femoropopliteal bypass to address his multiple aneurysms and dry gangrene of the right foot. In addition he has significant coronary artery disease and is being evaluated by Dr. Tonny Bollman. He is clearly at increased risk for surgery. Most recently he had a colon cancer resected and is recovering from this. Since I saw him last on 11/16/2010 he has not had any significant new onset abdominal pain or back pain except for the abdominal pain related to his surgery.  Past Medical History  Diagnosis Date  . HTN (hypertension)   . DM (diabetes mellitus)   . Hypothyroidism   . Hernia   . Dizziness   . AAA (abdominal aortic aneurysm)   . Hyperlipidemia   . Leg pain     with walking    FAMILY HISTORY: Family History  Problem Relation Age of Onset  . Diabetes Mother   . Heart disease Mother     heart failure  . Heart disease Brother   . Other Brother 72    MRSA  . COPD Father   . Heart disease Father     heart failure  . COPD Sister     SOCIAL HISTORY: History  Substance Use Topics  . Smoking status: Former Smoker -- 1.5 packs/day for 40 years    Types: Cigarettes    Quit date: 12/11/2010  . Smokeless tobacco: Not on file  . Alcohol Use: No    No Known Allergies  Current Outpatient Prescriptions  Medication Sig Dispense Refill  . aspirin 81 MG EC tablet Take 81 mg by mouth daily.        [name] atenolol (TENORMIN) 50 MG tablet Take 50 mg by mouth daily.        [name] glyBURIDE-metformin (GLUCOVANCE) 5-500 MG per tablet Take 1 tablet by mouth daily with breakfast.        . levothyroxine (SYNTHROID, LEVOTHROID) 100 MCG tablet Take 100 mcg by mouth daily.         [name] oxyCODONE-acetaminophen (PERCOCET) 5-325 MG per tablet Take 1 tablet by mouth every 4 (four) hours as needed.        . quinapril (ACCUPRIL) 20 MG tablet Take 20 mg by mouth at bedtime.        . cephALEXin (KEFLEX) 500 MG capsule Take 500 mg by mouth 3 (three) times daily.          REVIEW OF SYSTEMS: CARDIOVASCULAR: No chest pain, chest pressure, palpitations, orthopnea. He does have some dyspnea on exertion. No claudication or rest pain. No history of DVT or phlebitis. PULMONARY: No productive cough, asthma, or wheezing. NEUROLOGIC: No weakness, paresthesias, aphasia, amaurosis, or dizziness. HEMATOLOGIC: No bleeding problems or clotting disorders. MUSCULOSKELETAL: No joint pain or swelling. GASTROINTESTINAL: No blood in stool or hematemesis. GENITOURINARY: No dysuria or hematuria. PSYCHIATRIC: No history of major depression. INTEGUMENTARY: No rashes or ulcers. CONSTITUTIONAL: No fever or chills.  PHYSICAL EXAM: Filed Vitals:   01/11/11 1703  BP: 209/95  Pulse: 54  Resp: 16   Body mass index is 22.29 kg/(m^2).  GENERAL: The patient appears their stated age. The vital signs are documented above. CARDIOVASCULAR: There is a regular rate and rhythm without significant murmur appreciated.  I do not detect any carotid bruits. He has palpable femoral pulses bilaterally. PULMONARY: There is good air exchange bilaterally without wheezing or rales. ABDOMEN: Soft and non-tender with normal pitched bowel sounds. His aneurysm is easily palpable. He currently still has an incision in the abdomen on the right which is still healing and is partially open. MUSCULOSKELETAL: There are no major deformities or cyanosis. NEUROLOGIC: No focal weakness or paresthesias are detected. 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 Does have cellulitis of the right leg with significant right calf swelling. Some superficial ulcerations in the right  leg. PSYCHIATRIC: The patient has a normal affect.  DATA: I have reviewed his arteriogram which was performed on 08/22/2010. He has an abdominal aortic aneurysm, bilateral common iliac artery aneurysms, bilateral hypogastric artery occlusions, and bilateral femoral artery aneurysms. In addition he has bilateral superficial femoral artery occlusions.   MEDICAL ISSUES: I plan on seeing him back in 3 weeks to see if his abdominal incision has completely healed and also to see if the cellulitis in the right leg has improved where he has some superficial ulcerations in the pretibial area. I would not want to place a prosthetic graft in the face of these wounds. Things looked better at that time and we will likely be able to schedule his surgery which would involve aorto bi-profunda bypass and a right femoropopliteal bypass graft. This is going to be associated with significant risk given the extent of his disease, his debilitated state with poor nutrition, and his significant cardiac disease. However, given the size of the aneurysm (5.9 cm) he is at significant risk for rupture without addressing the aneurysm. I would estimate his risk of rupture is 15-20% per year.

## 2011-01-12 NOTE — Progress Notes (Signed)
Daisey Must with Mr Toste.  I'm seeing him back in a few weeks.

## 2011-01-13 ENCOUNTER — Other Ambulatory Visit: Payer: Self-pay | Admitting: Vascular Surgery

## 2011-01-13 DIAGNOSIS — I714 Abdominal aortic aneurysm, without rupture: Secondary | ICD-10-CM

## 2011-01-23 ENCOUNTER — Emergency Department (HOSPITAL_COMMUNITY)
Admission: EM | Admit: 2011-01-23 | Discharge: 2011-01-23 | Disposition: A | Discharge status: Home / Self Care | Payer: Medicare Other | Attending: Emergency Medicine | Admitting: Emergency Medicine

## 2011-01-23 ENCOUNTER — Emergency Department (HOSPITAL_COMMUNITY): Payer: Medicare Other

## 2011-01-23 ENCOUNTER — Encounter (HOSPITAL_COMMUNITY): Payer: Self-pay | Admitting: *Deleted

## 2011-01-23 ENCOUNTER — Other Ambulatory Visit: Payer: Self-pay

## 2011-01-23 DIAGNOSIS — E039 Hypothyroidism, unspecified: Secondary | ICD-10-CM | POA: Insufficient documentation

## 2011-01-23 DIAGNOSIS — E785 Hyperlipidemia, unspecified: Secondary | ICD-10-CM | POA: Insufficient documentation

## 2011-01-23 DIAGNOSIS — E119 Type 2 diabetes mellitus without complications: Secondary | ICD-10-CM | POA: Insufficient documentation

## 2011-01-23 DIAGNOSIS — F17200 Nicotine dependence, unspecified, uncomplicated: Secondary | ICD-10-CM | POA: Insufficient documentation

## 2011-01-23 DIAGNOSIS — I498 Other specified cardiac arrhythmias: Secondary | ICD-10-CM | POA: Insufficient documentation

## 2011-01-23 DIAGNOSIS — Z859 Personal history of malignant neoplasm, unspecified: Secondary | ICD-10-CM | POA: Insufficient documentation

## 2011-01-23 DIAGNOSIS — I10 Essential (primary) hypertension: Secondary | ICD-10-CM | POA: Insufficient documentation

## 2011-01-23 DIAGNOSIS — I4460 Unspecified fascicular block: Secondary | ICD-10-CM | POA: Insufficient documentation

## 2011-01-23 LAB — COMPREHENSIVE METABOLIC PANEL
Alkaline Phosphatase: 101 U/L (ref 39–117)
BUN: 20 mg/dL (ref 6–23)
CO2: 30 mEq/L (ref 19–32)
GFR calc Af Amer: >60 mL/min (ref >60)
GFR calc non Af Amer: >60 mL/min (ref >60)
Glucose, Bld: 178 mg/dL — ABNORMAL HIGH (ref 70–99)
Potassium: 4.3 mEq/L (ref 3.5–5.1)
Total Protein: 7.2 g/dL (ref 6.0–8.3)

## 2011-01-23 LAB — CBC
HCT: 38 % — ABNORMAL LOW (ref 39.0–52.0)
Hemoglobin: 11.8 g/dL — ABNORMAL LOW (ref 13.0–17.0)
MCH: 26.9 pg (ref 26.0–34.0)
MCHC: 31.1 g/dL (ref 30.0–36.0)
RBC: 4.38 MIL/uL (ref 4.22–5.81)

## 2011-01-23 LAB — URINALYSIS, ROUTINE W REFLEX MICROSCOPIC
Bilirubin Urine: NEGATIVE
Ketones, ur: NEGATIVE mg/dL
Leukocytes, UA: NEGATIVE
Nitrite: NEGATIVE
Protein, ur: NEGATIVE mg/dL
Urobilinogen, UA: 0.2 mg/dL (ref 0.0–1.0)
pH: 6 (ref 5.0–8.0)

## 2011-01-23 LAB — GLUCOSE, CAPILLARY: Glucose-Capillary: 185 mg/dL — ABNORMAL HIGH (ref 70–99)

## 2011-01-23 MED ORDER — SODIUM CHLORIDE 0.9 % IV SOLN
INTRAVENOUS | Status: DC
  Administered 2011-01-23: 14:00:00 via INTRAVENOUS

## 2011-01-23 NOTE — ED Provider Notes (Signed)
History     CSN: [id number] Arrival date & time: 01/23/2011 12:58 PM   Chief Complaint  Patient presents with  . Hypertension     (Include location/radiation/quality/duration/timing/severity/associated sxs/prior treatment) The history is provided by the patient and a relative.  SENT IN BY PCM FOR HIGH BLOOD SUGAR AND BLOOD PRESSURE. BLOOD SUGAR AT HOME WAS 344, AND BP WAS 195/108. PATIENT HAS KNOWN CAD, DIABETES, AND RECENT SURGERY FOR COLON CA 4 WEEKS AGO. ONLY COMPLAINT IS SOME ON OFF DIZZINESS FOR DAYS. TODAY NO CP, SOB, HEADACHE, OR STROKE LIKE SYMPTOMS. NO CHANGE IN MEDS RECENTLY, APPETITE HAS BEEN INCREASING SINCE SURGERY.    Past Medical History  Diagnosis Date  . HTN (hypertension)   . DM (diabetes mellitus)   . Hypothyroidism   . Hernia   . Dizziness   . AAA (abdominal aortic aneurysm)   . Hyperlipidemia   . Leg pain     with walking  . Cancer      Past Surgical History  Procedure Date  . Hernia repair   . Appendectomy   . Cardiac catheterization   . Colon surgery 12/12/10    r/t colon CA  . Abdominal surgery     Family History  Problem Relation Age of Onset  . Diabetes Mother   . Heart disease Mother     heart failure  . Heart disease Brother   . Other Brother 72    MRSA  . COPD Father   . Heart disease Father     heart failure  . COPD Sister     History  Substance Use Topics  . Smoking status: Current Some Day Smoker -- 0.5 packs/day for 50 years    Types: Cigarettes    Last Attempt to Quit: 12/11/2010  . Smokeless tobacco: Not on file  . Alcohol Use: No      Review of Systems  Constitutional: Negative for fever.  HENT: Negative for congestion.   Eyes: Negative for visual disturbance.  Respiratory: Negative for chest tightness and shortness of breath.   Cardiovascular: Negative for chest pain.  Gastrointestinal: Positive for abdominal pain. Negative for nausea, vomiting and diarrhea.  Genitourinary: Negative for dysuria.    Musculoskeletal: Negative for back pain.  Neurological: Negative for weakness and headaches.    Allergies  Review of patient's allergies indicates no known allergies.  Home Medications   Current Outpatient Rx  Name Route Sig Dispense Refill  . ASPIRIN 81 MG PO TBEC Oral Take 81 mg by mouth daily.      . ATENOLOL 50 MG PO TABS Oral Take 50 mg by mouth daily.      . CEPHALEXIN 500 MG PO CAPS Oral Take 500 mg by mouth 3 (three) times daily.      . GLYBURIDE-METFORMIN 5-500 MG PO TABS Oral Take 1 tablet by mouth daily with breakfast.      . LEVOTHYROXINE SODIUM 100 MCG PO TABS Oral Take 100 mcg by mouth daily.      . OXYCODONE-ACETAMINOPHEN 5-325 MG PO TABS Oral Take 1 tablet by mouth every 4 (four) hours as needed.      . QUINAPRIL HCL 20 MG PO TABS Oral Take 20 mg by mouth at bedtime.        Physical Exam    BP 198/78  Pulse 58  Temp(Src) 98.5 F (36.9 C) (Oral)  Resp 20  Ht 5' 10.5" (1.791 m)  Wt 155 lb (70.308 kg)  BMI 21.93 kg/m2  SpO2 98%  Physical Exam  Nursing note and vitals reviewed. Constitutional: He is oriented to person, place, and time. He appears well-developed and well-nourished.  HENT:  Head: Normocephalic and atraumatic.  Eyes: EOM are normal. Pupils are equal, round, and reactive to light.  Neck: Normal range of motion. Neck supple.  Cardiovascular:       BRADYCARDIC AND IRREGULAR.   Abdominal: Bowel sounds are normal.       MIDLINE INCISION WITH SOME MILD TENDERNESS  Neurological: He is alert and oriented to person, place, and time. He displays normal reflexes. No cranial nerve deficit. He exhibits normal muscle tone. Coordination normal.  Skin: Skin is dry. No rash noted.       BILAT LE SHIN HEALING ABRASIONS.     ED Course  Procedures  Results for orders placed during the hospital encounter of 01/23/11  GLUCOSE, CAPILLARY      Component Value Range   Glucose-Capillary 185 (*) 70 - 99 (mg/dL)   No results found.  Date: 01/23/2011  Rate:  52  Rhythm: sinus bradycardia  QRS Axis: normal  Intervals: normal  ST/T Wave abnormalities: normal  Conduction Disutrbances:left bundle branch block  Narrative Interpretation:   Old EKG Reviewed: changes noted Incomplete LBBB now, previous pvc have resolved from 12/16/10  Results for orders placed during the hospital encounter of 01/23/11  GLUCOSE, CAPILLARY      Component Value Range   Glucose-Capillary 185 (*) 70 - 99 (mg/dL)  CBC      Component Value Range   WBC 6.8  4.0 - 10.5 (K/uL)   RBC 4.38  4.22 - 5.81 (MIL/uL)   Hemoglobin 11.8 (*) 13.0 - 17.0 (g/dL)   HCT 16.1 (*) 09.6 - 52.0 (%)   MCV 86.8  78.0 - 100.0 (fL)   MCH 26.9  26.0 - 34.0 (pg)   MCHC 31.1  30.0 - 36.0 (g/dL)   RDW 04.5  40.9 - 81.1 (%)   Platelets 183  150 - 400 (K/uL)  COMPREHENSIVE METABOLIC PANEL      Component Value Range   Sodium 139  135 - 145 (mEq/L)   Potassium 4.3  3.5 - 5.1 (mEq/L)   Chloride 102  96 - 112 (mEq/L)   CO2 30  19 - 32 (mEq/L)   Glucose, Bld 178 (*) 70 - 99 (mg/dL)   BUN 20  6 - 23 (mg/dL)   Creatinine, Ser 9.14  0.50 - 1.35 (mg/dL)   Calcium 9.0  8.4 - 78.2 (mg/dL)   Total Protein 7.2  6.0 - 8.3 (g/dL)   Albumin 3.5  3.5 - 5.2 (g/dL)   AST 16  0 - 37 (U/L)   ALT 14  0 - 53 (U/L)   Alkaline Phosphatase 101  39 - 117 (U/L)   Total Bilirubin 0.2 (*) 0.3 - 1.2 (mg/dL)   GFR calc non Af Amer >60  >60 (mL/min)   GFR calc Af Amer >60  >60 (mL/min)  URINALYSIS, ROUTINE W REFLEX MICROSCOPIC      Component Value Range   Color, Urine YELLOW  YELLOW    Appearance CLEAR  CLEAR    Specific Gravity, Urine 1.020  1.005 - 1.030    pH 6.0  5.0 - 8.0    Glucose, UA 250 (*) NEGATIVE (mg/dL)   Hgb urine dipstick NEGATIVE  NEGATIVE    Bilirubin Urine NEGATIVE  NEGATIVE    Ketones, ur NEGATIVE  NEGATIVE (mg/dL)   Protein, ur NEGATIVE  NEGATIVE (mg/dL)   Urobilinogen, UA 0.2  0.0 - 1.0 (  mg/dL)   Nitrite NEGATIVE  NEGATIVE    Leukocytes, UA NEGATIVE  NEGATIVE    Results for orders placed  during the hospital encounter of 01/23/11  GLUCOSE, CAPILLARY      Component Value Range   Glucose-Capillary 185 (*) 70 - 99 (mg/dL)  CBC      Component Value Range   WBC 6.8  4.0 - 10.5 (K/uL)   RBC 4.38  4.22 - 5.81 (MIL/uL)   Hemoglobin 11.8 (*) 13.0 - 17.0 (g/dL)   HCT 16.1 (*) 09.6 - 52.0 (%)   MCV 86.8  78.0 - 100.0 (fL)   MCH 26.9  26.0 - 34.0 (pg)   MCHC 31.1  30.0 - 36.0 (g/dL)   RDW 04.5  40.9 - 81.1 (%)   Platelets 183  150 - 400 (K/uL)  COMPREHENSIVE METABOLIC PANEL      Component Value Range   Sodium 139  135 - 145 (mEq/L)   Potassium 4.3  3.5 - 5.1 (mEq/L)   Chloride 102  96 - 112 (mEq/L)   CO2 30  19 - 32 (mEq/L)   Glucose, Bld 178 (*) 70 - 99 (mg/dL)   BUN 20  6 - 23 (mg/dL)   Creatinine, Ser 9.14  0.50 - 1.35 (mg/dL)   Calcium 9.0  8.4 - 78.2 (mg/dL)   Total Protein 7.2  6.0 - 8.3 (g/dL)   Albumin 3.5  3.5 - 5.2 (g/dL)   AST 16  0 - 37 (U/L)   ALT 14  0 - 53 (U/L)   Alkaline Phosphatase 101  39 - 117 (U/L)   Total Bilirubin 0.2 (*) 0.3 - 1.2 (mg/dL)   GFR calc non Af Amer >60  >60 (mL/min)   GFR calc Af Amer >60  >60 (mL/min)  URINALYSIS, ROUTINE W REFLEX MICROSCOPIC      Component Value Range   Color, Urine YELLOW  YELLOW    Appearance CLEAR  CLEAR    Specific Gravity, Urine 1.020  1.005 - 1.030    pH 6.0  5.0 - 8.0    Glucose, UA 250 (*) NEGATIVE (mg/dL)   Hgb urine dipstick NEGATIVE  NEGATIVE    Bilirubin Urine NEGATIVE  NEGATIVE    Ketones, ur NEGATIVE  NEGATIVE (mg/dL)   Protein, ur NEGATIVE  NEGATIVE (mg/dL)   Urobilinogen, UA 0.2  0.0 - 1.0 (mg/dL)   Nitrite NEGATIVE  NEGATIVE    Leukocytes, UA NEGATIVE  NEGATIVE    Dg Chest 2 View  01/23/2011  *RADIOLOGY REPORT*  Clinical Data: Dizziness and weakness  CHEST - 2 VIEW  Comparison: 12/15/2010  Findings: COPD with hyperinflation.  Improved aeration in the lung bases since the prior study.  Negative for infiltrate or effusion on the current study.  Negative for heart failure.  Heart size is normal.   IMPRESSION: COPD.  No acute cardiopulmonary disease.  Original Report Authenticated By: Camelia Phenes, M.D.      No diagnosis found.   MDM IN ED BLOOD SUGAR BELOW 200 AND DOES NOT NEED SPECIFIC TX HERE TODAY. BLOOD PRESSURE SYSTOLIC IS HIGH IN 170 TO 180 RANGE BUT DIASTOLIC BP IS NORMAL AT 70 RANGE. PATIENT WITH MULTIPLE CARDIAC AND DIABETIC RELATED PROBLEMS. HAS FOLLOW UP WIT PCM AND CARDIOLOGY . RECOMMEND CLOSE FOLLOW UP AND RECHECK OF BOTH BLOOD SUGAR AND BLOOD PRESSURE IN NEXT 1-2 DAYS. LABS WITHOUT SIG FINDINGS.    DX: HYPERTENSION AND HYPERGLYCEMIA.     Shelda Jakes, MD 01/23/11 1535

## 2011-01-23 NOTE — ED Notes (Signed)
Pt states glucose is high 344 at home and BP is 195/105 also at home.  Checked by home health aide secondary to pt being 4 weeks post-operative of colon cancer surgery.  Pt's recheck glucose is 188 here.

## 2011-01-24 ENCOUNTER — Encounter (INDEPENDENT_AMBULATORY_CARE_PROVIDER_SITE_OTHER): Payer: Self-pay | Admitting: Surgery

## 2011-01-24 ENCOUNTER — Ambulatory Visit (INDEPENDENT_AMBULATORY_CARE_PROVIDER_SITE_OTHER): Payer: Medicare Other | Admitting: Surgery

## 2011-01-24 VITALS — BP 188/78 | HR 56 | Temp 98.2°F | Resp 20 | Ht 65.0 in | Wt 155.0 lb

## 2011-01-24 DIAGNOSIS — Z9889 Other specified postprocedural states: Secondary | ICD-10-CM

## 2011-01-24 NOTE — Patient Instructions (Signed)
Follow up in 6 months 

## 2011-01-24 NOTE — Progress Notes (Signed)
The patient returns to clinic today. He is one month out from a laparoscopic assisted right hemicolectomy for colon cancer. He is doing well. He has no complaints.  On exam: Right lower quadrant incision healing well. It is then packed open but is almost closed. Abdomen soft nontender.  Impression: Status post right hemicolectomy laparoscopic-assisted for colon cancer  Plan: He is multiple other medical problems. He is seeing Dr. Edilia Bo of vascular surgery about his vascular issues. He is not a candidate for chemotherapy he was in severe heart disease and significant other medical problems. He has no interest in chemotherapy at this time anyway. I will see him back in 6 months.

## 2011-01-27 ENCOUNTER — Encounter: Payer: Self-pay | Admitting: Physician Assistant

## 2011-01-27 ENCOUNTER — Ambulatory Visit (INDEPENDENT_AMBULATORY_CARE_PROVIDER_SITE_OTHER): Payer: Medicare Other | Admitting: Physician Assistant

## 2011-01-27 DIAGNOSIS — I498 Other specified cardiac arrhythmias: Secondary | ICD-10-CM

## 2011-01-27 DIAGNOSIS — I10 Essential (primary) hypertension: Secondary | ICD-10-CM

## 2011-01-27 DIAGNOSIS — I2510 Atherosclerotic heart disease of native coronary artery without angina pectoris: Secondary | ICD-10-CM

## 2011-01-27 DIAGNOSIS — F17200 Nicotine dependence, unspecified, uncomplicated: Secondary | ICD-10-CM

## 2011-01-27 DIAGNOSIS — R001 Bradycardia, unspecified: Secondary | ICD-10-CM | POA: Insufficient documentation

## 2011-01-27 MED ORDER — QUINAPRIL HCL 20 MG PO TABS: 40.0000 mg | ORAL_TABLET | Freq: Every day | ORAL | Status: DC

## 2011-01-27 NOTE — Patient Instructions (Addendum)
Your physician recommends that you schedule a follow-up appointment in: 03/01/11 @ 9:15 WITH DR. Eden Emms PER SCOTT WEAVER, PA-C  Your physician has recommended that you wear a 24 HOUR holter monitor DX 427.89 THIS WILL BE DONE @ [facility] @ 2:00 01/30/11 PER TERRY, YOU WILL GO IN THE MAIN ENTRANCE AND LET THEM KNOW YOU ARE THERE FOR A MONITOR. Holter monitors are medical devices that record the heart's electrical activity. Doctors most often use these monitors to diagnose arrhythmias. Arrhythmias are problems with the speed or rhythm of the heartbeat. The monitor is a small, portable device. You can wear one while you do your normal daily activities. This is usually used to diagnose what is causing palpitations/syncope (passing out).  Your physician has recommended you make the following change in your medication: INCREASE ACCUPRIL 40 MG DAILY  PLEASE HAVE [facility] GET SOME BLOOD WORK BMET 401.9 HTN ON 02/03/11 AND HAVE RESULTS FAXED TO [place], PA-C [phone].

## 2011-01-27 NOTE — Assessment & Plan Note (Signed)
He has followup with Dr. Edilia Bo.

## 2011-01-27 NOTE — Assessment & Plan Note (Signed)
Uncontrolled.  Increase Accupril to 40 mg daily.  Check a basic metabolic panel in one week.

## 2011-01-27 NOTE — Assessment & Plan Note (Signed)
Stable.  Continue aspirin.  Followup with Dr. Eden Emms in one month.  We will eventually need to decide timing of PCI and vascular surgery.

## 2011-01-27 NOTE — Assessment & Plan Note (Signed)
He is asymptomatic with this.  With his severe coronary disease, I would not change his beta blocker.  I will set him up for 24 Holter monitor to rule out significant arrhythmias and significant bradycardia.

## 2011-01-27 NOTE — Assessment & Plan Note (Signed)
He has started back to smoking.  We talked about using nicotine patches today.  He understands that he cannot smoke with nicotine patches on.

## 2011-01-27 NOTE — Progress Notes (Signed)
History of Present Illness: Primary Cardiologist:  Dr. Charlton Haws  Peter Harvey is a 70 y.o. male with a complex history.  He has CAD with cath 5/12: dLM 50%, pLAD 50%, pCFX 50%, OM1 occluded with distal filling with L-L collats, mRCA 70-75%, dRCA 80%.  He also has severe PAD, followed by Dr. Edilia Bo.  He has infrainguinal arterio-occlusive disease and dry gangrene of 2 toes on the right foot, AAA and bilateral iliac artery aneurysms and bilateral femoral artery aneurysms.  Aortobifemoral bypass and stenting of his severe RCA disease has been considered.  However, these have been put on hold due to a diagnosis of colon cancer.  It was felt that he was high risk for colon surgery but PCI would delay treatment.  He underwent laparoscopic right hemicolectomy with Dr. Luisa Harvey on 12/14/10.  He had some post op bleeding that required re-exploration and an anastamotic leak was fixed.  His second wound was left open to close be secondary intention.  He developed dry gangrene of his right 3rd and 4th toes.  Otherwise, his post op course was uncomplicated.    Here for follow up.  [name] asked that he be seen due to "irregular heart beat."  Patient denies palpitations.  No dizziness or near syncope.  No syncope.  No chest pain, orthopnea, PND.  RLE edema stable from PAD.  Breathing is stable.  Went to ED Monday with high sugar and high BP.  BP today is high.  Labs in ED:  Creat 0.69, K 4.3.   Past Medical History  Diagnosis Date  . HTN (hypertension)   . DM (diabetes mellitus)   . Hypothyroidism   . Hernia   . Dizziness   . AAA (abdominal aortic aneurysm)     Dr. Edilia Bo  . Hyperlipidemia   . Leg pain     with walking  . 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 CAD (coronary artery disease)     cath 5/12: dLM 50%, pLAD 50%, pCFX 50%, OM1 occluded with distal filling with L-L collats, mRCA 70-75%, dRCA 80%;  severe RCA needs PCI but treated medically due to need for colon surgery  and plans to possilby perform PCI in future  . PAD (peripheral artery disease)     c/b dry gangrene of toes    Current Outpatient Prescriptions  Medication Sig Dispense Refill  . aspirin 81 MG EC tablet Take 81 mg by mouth daily.        [name] atenolol (TENORMIN) 50 MG tablet Take 50 mg by mouth daily.        . cephALEXin (KEFLEX) 500 MG capsule Take 500 mg by mouth 3 (three) times daily.        . famotidine (PEPCID) 20 MG tablet Take 20 mg by mouth once.       . glyBURIDE-metformin (GLUCOVANCE) 5-500 MG per tablet Take 1 tablet by mouth 2 (two) times daily.       [name] levothyroxine (SYNTHROID, LEVOTHROID) 100 MCG tablet Take 100 mcg by mouth daily.        [name] oxyCODONE-acetaminophen (PERCOCET) 5-325 MG per tablet Take 1 tablet by mouth every 4 (four) hours as needed. For pain      . polyethylene glycol (MIRALAX / GLYCOLAX) packet Take 17 g by mouth daily.        . quinapril (ACCUPRIL) 20 MG tablet Take 20 mg by mouth daily.         Allergies: No Known  Allergies  Vital Signs: BP 169/81  Pulse 48  Ht 5\' 7"  (1.702 m)  Wt 157 lb (71.215 kg)  BMI 24.59 kg/m2  PHYSICAL EXAM: Well nourished, well developed, in no acute distress HEENT: normal Neck: no JVD at 90 degrees Cardiac:  normal S1, S2; RRR; no murmur Lungs:  Decreased breath sounds bilaterally, no wheezing, rhonchi or rales Abd: soft, nontender, wound dressing clean and dry Ext: 1+ right ankle edema with chronic stasis changes Skin: warm and dry Neuro:  CNs 2-12 intact, no focal abnormalities noted  EKG:  Sinus brady, HR 51, normal axis, NSSTTW changes  ASSESSMENT AND PLAN:

## 2011-01-30 ENCOUNTER — Ambulatory Visit (HOSPITAL_COMMUNITY)
Admission: RE | Admit: 2011-01-30 | Discharge: 2011-01-30 | Disposition: A | Discharge status: Home / Self Care | Payer: Medicare Other | Source: Ambulatory Visit | Attending: Cardiovascular Disease | Admitting: Cardiovascular Disease

## 2011-01-30 DIAGNOSIS — I4891 Unspecified atrial fibrillation: Secondary | ICD-10-CM

## 2011-01-30 NOTE — Progress Notes (Signed)
*  PRELIMINARY RESULTS* Echocardiogram 24H Holter monitor has been performed.  Conrad Saluda 01/30/2011, 2:39 PM

## 2011-02-01 NOTE — Procedures (Signed)
NAME:  Peter Harvey, Peter Harvey NO.:  [id number]  MEDICAL RECORD NO.:  [id number]  LOCATION:  CARDIOPU                      FACILITY:  [facility]  PHYSICIAN:  Gerrit Friends. Dietrich Pates, MD, FACCDATE OF BIRTH:  October 29, 1940  DATE OF PROCEDURE:  01/30/2011                               HOLTER MONITOR   REFERRING PHYSICIAN:  Noralyn Pick. Eden Emms, MD, [facility]  CLINICAL DATA:  A 70 year old gentleman with a history of atrial fibrillation.  1. Continuous electrocardiographic recording was maintained for 24     hours during which the predominant rhythms were normal sinus and     considerable sinus bradycardia with rates as low as 42 bpm.  There     was minimal sinus tachycardia with rates slightly above 100. 2. Fairly frequent PVCs occurred with an average rate of 30 per hour.     Rare paired PVCs also occurred. 3. Very frequent premature supraventricular depolarizations were     identified, occurring at an average rate of nearly 180 per hour.     Frequent atrial bigeminy was identified. 4. No significant ST-segment elevation or depression was seen. 5. A complete diary of activity was returned, but no symptoms were     reported.     Gerrit Friends. Dietrich Pates, MD, [facility]     RMR/MEDQ  D:  02/01/2011  T:  02/01/2011  Job:  [id number]

## 2011-02-03 ENCOUNTER — Encounter: Payer: Self-pay | Admitting: Cardiovascular Disease

## 2011-02-07 ENCOUNTER — Encounter: Payer: Self-pay | Admitting: Vascular Surgery

## 2011-02-07 ENCOUNTER — Encounter: Payer: Self-pay | Admitting: Cardiology

## 2011-02-08 ENCOUNTER — Telehealth: Payer: Self-pay | Admitting: Cardiovascular Disease

## 2011-02-08 ENCOUNTER — Ambulatory Visit (INDEPENDENT_AMBULATORY_CARE_PROVIDER_SITE_OTHER): Payer: Medicare Other | Admitting: Vascular Surgery

## 2011-02-08 ENCOUNTER — Encounter: Payer: Self-pay | Admitting: Vascular Surgery

## 2011-02-08 ENCOUNTER — Encounter: Payer: Self-pay | Admitting: *Deleted

## 2011-02-08 ENCOUNTER — Ambulatory Visit
Admission: RE | Admit: 2011-02-08 | Discharge: 2011-02-08 | Disposition: A | Discharge status: Home / Self Care | Payer: Medicare Other | Source: Ambulatory Visit | Attending: Vascular Surgery | Admitting: Vascular Surgery

## 2011-02-08 VITALS — BP 189/80 | HR 56 | Resp 20 | Ht 70.0 in | Wt 157.0 lb

## 2011-02-08 DIAGNOSIS — I714 Abdominal aortic aneurysm, without rupture: Secondary | ICD-10-CM

## 2011-02-08 MED ORDER — IOHEXOL 300 MG/ML  SOLN
100.0000 mL | Freq: Once | INTRAMUSCULAR | Status: AC | PRN
  Administered 2011-02-08: 100 mL via INTRAVENOUS

## 2011-02-08 NOTE — Telephone Encounter (Signed)
Dr Edilia Bo spoke with dr Eden Emms today and got an okay for surgery, they do need a fax for anesthesia saying its ok, fax [phone]

## 2011-02-08 NOTE — Progress Notes (Signed)
[facility] [place]  Patient name: Peter Harvey MRN: [id number] DOB: 05/25/1940 Sex: male  CC: follow up AAA  HPI: Peter Harvey is a 70 y.o. 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 He was found have a large abdominal aortic aneurysm was also found to have a colon cancer. His aneurysm is quite complicated in that he has an abdominal aortic aneurysm, bilateral common iliac artery aneurysms, bilateral hypogastric artery occlusions, and bilateral common femoral artery aneurysms. In addition he has bilateral superficial femoral artery occlusions. He has undergone resection of his colon cancer. He now presents for discussion of all  Elective open repair of his aneurysm. Given that he has bilateral common femoral artery aneurysms he is not a candidate for endovascular approach to his aneurysm.  Patient does have a history of coronary artery disease and had previous cardiac catheterization by Dr. Excell Seltzer which showed severe right coronary artery stenosis with tandem lesions in med and distal vessel. A moderate distal left main stenosis. Dr. Excell Seltzer felt all things considered it would be best to proceed with surgery treat the patient with aggressive medical therapy perioperatively and then consider addressing the right coronary artery stenosis following surgery. Of note he has had no recent chest pain chest pressure or palpitations.  Past Medical History  Diagnosis Date  . HTN (hypertension)   . DM (diabetes mellitus)   . Hypothyroidism   . Hernia   . Dizziness   . AAA (abdominal aortic aneurysm)     Dr. Edilia Bo  . Hyperlipidemia   . Leg pain     with walking  . 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 CAD (coronary artery disease)     EF 57% by Myoview 4/12;  cath 5/12: dLM 50%, pLAD 50%, pCFX 50%, OM1 occluded with distal filling with L-L collats, mRCA 70-75%,  dRCA 80%;  severe RCA needs PCI but treated medically due to need for colon surgery and plans to possilby perform PCI in future  . PAD (peripheral artery disease)     c/b dry gangrene of toes  . CHF (congestive heart failure)   . COPD (chronic obstructive pulmonary disease)     Family History  Problem Relation Age of Onset  . Diabetes Mother   . Heart disease Mother     heart failure  . Heart disease Brother   . Other Brother 72    MRSA  . COPD Father   . Heart disease Father     heart failure  . COPD Sister     SOCIAL HISTORY: History  Substance Use Topics  . Smoking status: Current Some Day Smoker -- 0.5 packs/day for 50 years    Types: Cigarettes    Last Attempt to Quit: 12/11/2010  . Smokeless tobacco: Never Used   Comment: pt is really trying to quit  . Alcohol Use: No    No Known Allergies  Current Outpatient Prescriptions  Medication Sig Dispense Refill  . aspirin 81 MG EC tablet Take 81 mg by mouth daily.        [name] atenolol (TENORMIN) 50 MG tablet Take 50 mg by mouth daily.        . cephALEXin (KEFLEX) 500 MG capsule Take 500 mg by mouth 3 (three) times daily.        . famotidine (PEPCID) 20 MG tablet Take 20 mg by mouth  once.       . glyBURIDE-metformin (GLUCOVANCE) 5-500 MG per tablet Take 1 tablet by mouth 2 (two) times daily.       [name] levothyroxine (SYNTHROID, LEVOTHROID) 100 MCG tablet Take 100 mcg by mouth daily.        [name] oxyCODONE-acetaminophen (PERCOCET) 5-325 MG per tablet Take 1 tablet by mouth every 4 (four) hours as needed. For pain      . polyethylene glycol (MIRALAX / GLYCOLAX) packet Take 17 g by mouth daily.        . quinapril (ACCUPRIL) 20 MG tablet Take 2 tablets (40 mg total) by mouth daily.  60 tablet  11   No current facility-administered medications for this visit.   Facility-Administered Medications Ordered in Other Visits  Medication Dose Route Frequency Provider Last Rate Last Dose  . iohexol (OMNIPAQUE) 300 MG/ML injection 100 mL  100  mL Intravenous Once PRN Medication Radiologist   100 mL at 02/08/11 1610    REVIEW OF SYSTEMS: Arly.Keller ] denotes positive finding; [  ] denotes negative finding CARDIOVASCULAR:  [ ]  chest pain   [ ]  chest pressure   [ ]  palpitations   [ ]  orthopnea   [ ]  dyspnea on exertion   [ ]  claudication   [ ]  rest pain   [ ]  DVT   [ ]  phlebitis PULMONARY:   [ ]  productive cough   [ ]  asthma   [ ]  wheezing NEUROLOGIC:   [ ]  weakness  [ ]  paresthesias  [ ]  aphasia  [ ]  amaurosis  [ ]  dizziness HEMATOLOGIC:   [ ]  bleeding problems   [ ]  clotting disorders MUSCULOSKELETAL:  [ ]  joint pain   [ ]  joint swelling GASTROINTESTINAL: [ ]   blood in stool  [ ]   hematemesis GENITOURINARY:  [ ]   dysuria  [ ]   hematuria PSYCHIATRIC:  [ ]  history of major depression INTEGUMENTARY:  [ ]  rashes  [ ]  ulcers CONSTITUTIONAL:  [ ]  fever   [ ]  chills  PHYSICAL EXAM: Filed Vitals:   02/08/11 1016  BP: 189/80  Pulse: 56  Resp: 20  Height: 5\' 10"  (1.778 m)  Weight: 157 lb (71.215 kg)   Body mass index is 22.53 kg/(m^2). GENERAL: The patient is a well-nourished male, in no acute distress. The vital signs are documented above. CARDIOVASCULAR: There is a regular rate and rhythm without significant murmur appreciated. I do not detect any carotid bruits. He has bilateral femoral artery aneurysms which are palpable. He does not have palpable pedal pulses. PULMONARY: There is good air exchange bilaterally without wheezing or rales. ABDOMEN: Soft and non-tender with normal pitched bowel sounds. His wound on the right side of his abdomen from his previous colon surgery is now healed. His aneurysm is palpable. MUSCULOSKELETAL: There are no major deformities or cyanosis. NEUROLOGIC: No focal weakness or paresthesias are detected. SKIN: There are no ulcers or rashes noted.He has dry gangrene of the third toe of the right foot. There are no wounds in the left foot currently. PSYCHIATRIC: The patient has a normal affect.  DATA:  I  reviewed his CT scan of the abdomen which was done today which shows a 6.5 cm infrarenal abdominal aortic aneurysm with bilateral common iliac artery aneurysms and bilateral femoral artery aneurysms.   MEDICAL ISSUES: Given the size of his aneurysm his risk of rupture is approximately 15-20% per year. Therefore despite the risk of surgery have recommended we proceed with open repair. Situation is complicated  by the fact he's had recent colon surgery and has bilateral hypogastric artery occlusions in addition to the extent of his aneurysmal disease. In addition he has cardiac disease as described above. Clearly he is at increased risk for surgery.We have discussed the indications for aneurysm repair. I have explained that the risk of rupture without repair is approximately 5-10% per year. We have discussed the advantages and disadvantages of open versus endovascular repair.  The patient wishes to proceed with open repair. I have discussed the potential complications of surgery, including but not limited to bleeding, renal failure, MI, wound healing problems, hernia, graft infection, embolization, or other unpredictable medical problems. I have explained that the risk of mortality or major morbidity is approximately 5-10%. All of the patients questions were answered and they are agreeable to proceed with surgery. I have explained that I feel it is necessary to proceed at the same time with right femoropopliteal bypass grafting given the dry gangrene of his right foot. Hopefully he will have adequate vein for a vein bypass. The for any reason he is unstable at the time of surgery which could potentially have to consider doing this in a staged fashion.  I have discussed the importance of tobacco sedation again with the patient explaining this and then certainly help lower his risk of pulmonary complications periodically if he were able to quit. In addition I discussed the case today with Dr. Loleta Dicker who agrees  that given the size of the aneurysm would be best to proceed with repair despite the increased risk and they would be happy to assist perioperatively as needed with his cardiac issues.  DICKSON,CHRISTOPHER S [facility] [place] Office: [phone]

## 2011-02-08 NOTE — Telephone Encounter (Signed)
Letter created and faxed to the number provided [employer]  

## 2011-02-09 ENCOUNTER — Telehealth: Payer: Self-pay | Admitting: Cardiovascular Disease

## 2011-02-09 MED ORDER — QUINAPRIL HCL 20 MG PO TABS: 40.0000 mg | ORAL_TABLET | Freq: Every day | ORAL | Status: DC

## 2011-02-09 NOTE — Telephone Encounter (Signed)
Requesting a refill quinipril only few pills left called to [facility] pharmacy

## 2011-02-13 ENCOUNTER — Encounter (HOSPITAL_COMMUNITY)
Admission: RE | Admit: 2011-02-13 | Discharge: 2011-02-13 | Disposition: A | Discharge status: Home / Self Care | Payer: Medicare Other | Source: Ambulatory Visit | Attending: Vascular Surgery | Admitting: Vascular Surgery

## 2011-02-13 LAB — CBC
HCT: 38.9 % — ABNORMAL LOW (ref 39.0–52.0)
MCH: 25.9 pg — ABNORMAL LOW (ref 26.0–34.0)
MCV: 83.8 fL (ref 78.0–100.0)
Platelets: 165 10*3/uL (ref 150–400)
RDW: 14.5 % (ref 11.5–15.5)

## 2011-02-13 LAB — URINALYSIS, ROUTINE W REFLEX MICROSCOPIC
Ketones, ur: NEGATIVE mg/dL
Leukocytes, UA: NEGATIVE
Nitrite: NEGATIVE

## 2011-02-13 LAB — SURGICAL PCR SCREEN
MRSA, PCR: NEGATIVE
Staphylococcus aureus: NEGATIVE

## 2011-02-13 LAB — URINE MICROSCOPIC-ADD ON

## 2011-02-13 LAB — COMPREHENSIVE METABOLIC PANEL
Albumin: 3.6 g/dL (ref 3.5–5.2)
BUN: 16 mg/dL (ref 6–23)
Calcium: 9.2 mg/dL (ref 8.4–10.5)
Chloride: 102 mEq/L (ref 96–112)
Creatinine, Ser: 0.81 mg/dL (ref 0.50–1.35)
GFR calc non Af Amer: 88 mL/min — ABNORMAL LOW (ref >90)
Total Bilirubin: 0.2 mg/dL — ABNORMAL LOW (ref 0.3–1.2)

## 2011-02-13 LAB — BLOOD GAS, ARTERIAL
Acid-Base Excess: 1.7 mmol/L (ref 0.0–2.0)
O2 Saturation: 95.5 %
Patient temperature: 98.6
TCO2: 27.4 mmol/L (ref 0–100)
pCO2 arterial: 43.5 mmHg (ref 35.0–45.0)

## 2011-02-17 ENCOUNTER — Inpatient Hospital Stay (HOSPITAL_COMMUNITY): Payer: Medicare Other

## 2011-02-17 ENCOUNTER — Inpatient Hospital Stay (HOSPITAL_COMMUNITY)
Admission: RE | Admit: 2011-02-17 | Discharge: 2011-02-28 | DRG: 238 | Disposition: A | Discharge status: Home Health | Payer: Medicare Other | Source: Ambulatory Visit | Attending: Vascular Surgery | Admitting: Vascular Surgery

## 2011-02-17 ENCOUNTER — Other Ambulatory Visit: Payer: Self-pay | Admitting: Vascular Surgery

## 2011-02-17 DIAGNOSIS — Z79899 Other long term (current) drug therapy: Secondary | ICD-10-CM

## 2011-02-17 DIAGNOSIS — Z01812 Encounter for preprocedural laboratory examination: Secondary | ICD-10-CM

## 2011-02-17 DIAGNOSIS — I723 Aneurysm of iliac artery: Secondary | ICD-10-CM

## 2011-02-17 DIAGNOSIS — I2510 Atherosclerotic heart disease of native coronary artery without angina pectoris: Secondary | ICD-10-CM | POA: Diagnosis present

## 2011-02-17 DIAGNOSIS — I714 Abdominal aortic aneurysm, without rupture, unspecified: Principal | ICD-10-CM | POA: Diagnosis present

## 2011-02-17 DIAGNOSIS — I509 Heart failure, unspecified: Secondary | ICD-10-CM | POA: Diagnosis present

## 2011-02-17 DIAGNOSIS — Z85038 Personal history of other malignant neoplasm of large intestine: Secondary | ICD-10-CM

## 2011-02-17 DIAGNOSIS — D62 Acute posthemorrhagic anemia: Secondary | ICD-10-CM | POA: Diagnosis not present

## 2011-02-17 DIAGNOSIS — Z7982 Long term (current) use of aspirin: Secondary | ICD-10-CM

## 2011-02-17 DIAGNOSIS — I739 Peripheral vascular disease, unspecified: Secondary | ICD-10-CM

## 2011-02-17 DIAGNOSIS — Z23 Encounter for immunization: Secondary | ICD-10-CM

## 2011-02-17 DIAGNOSIS — I724 Aneurysm of artery of lower extremity: Secondary | ICD-10-CM

## 2011-02-17 DIAGNOSIS — Z9861 Coronary angioplasty status: Secondary | ICD-10-CM

## 2011-02-17 DIAGNOSIS — J449 Chronic obstructive pulmonary disease, unspecified: Secondary | ICD-10-CM | POA: Diagnosis present

## 2011-02-17 DIAGNOSIS — J9620 Acute and chronic respiratory failure, unspecified whether with hypoxia or hypercapnia: Secondary | ICD-10-CM

## 2011-02-17 DIAGNOSIS — J9819 Other pulmonary collapse: Secondary | ICD-10-CM | POA: Diagnosis not present

## 2011-02-17 DIAGNOSIS — R0902 Hypoxemia: Secondary | ICD-10-CM

## 2011-02-17 DIAGNOSIS — J4489 Other specified chronic obstructive pulmonary disease: Secondary | ICD-10-CM | POA: Diagnosis present

## 2011-02-17 DIAGNOSIS — I10 Essential (primary) hypertension: Secondary | ICD-10-CM | POA: Diagnosis present

## 2011-02-17 DIAGNOSIS — E119 Type 2 diabetes mellitus without complications: Secondary | ICD-10-CM | POA: Diagnosis present

## 2011-02-17 DIAGNOSIS — F0390 Unspecified dementia without behavioral disturbance: Secondary | ICD-10-CM | POA: Diagnosis present

## 2011-02-17 DIAGNOSIS — I75029 Atheroembolism of unspecified lower extremity: Secondary | ICD-10-CM | POA: Diagnosis present

## 2011-02-17 DIAGNOSIS — I96 Gangrene, not elsewhere classified: Secondary | ICD-10-CM | POA: Diagnosis present

## 2011-02-17 DIAGNOSIS — E039 Hypothyroidism, unspecified: Secondary | ICD-10-CM | POA: Diagnosis present

## 2011-02-17 DIAGNOSIS — R4182 Altered mental status, unspecified: Secondary | ICD-10-CM

## 2011-02-17 DIAGNOSIS — F17200 Nicotine dependence, unspecified, uncomplicated: Secondary | ICD-10-CM | POA: Diagnosis present

## 2011-02-17 DIAGNOSIS — I70269 Atherosclerosis of native arteries of extremities with gangrene, unspecified extremity: Secondary | ICD-10-CM

## 2011-02-17 HISTORY — PX: ABDOMINAL AORTIC ANEURYSM REPAIR: SUR1152

## 2011-02-17 LAB — CBC
HCT: 34.3 % — ABNORMAL LOW (ref 39.0–52.0)
Hemoglobin: 10.6 g/dL — ABNORMAL LOW (ref 13.0–17.0)
MCV: 82.3 fL (ref 78.0–100.0)
RBC: 4.17 MIL/uL — ABNORMAL LOW (ref 4.22–5.81)
WBC: 10 10*3/uL (ref 4.0–10.5)

## 2011-02-17 LAB — BASIC METABOLIC PANEL
Chloride: 110 mEq/L (ref 96–112)
Creatinine, Ser: 0.87 mg/dL (ref 0.50–1.35)
GFR calc Af Amer: >90 mL/min (ref >90)
Potassium: 3.9 mEq/L (ref 3.5–5.1)
Sodium: 140 mEq/L (ref 135–145)

## 2011-02-17 LAB — PROTIME-INR: INR: 1.39 (ref 0.00–1.49)

## 2011-02-17 LAB — APTT: aPTT: 30 seconds (ref 24–37)

## 2011-02-17 LAB — GLUCOSE, CAPILLARY: Glucose-Capillary: 220 mg/dL — ABNORMAL HIGH (ref 70–99)

## 2011-02-18 ENCOUNTER — Inpatient Hospital Stay (HOSPITAL_COMMUNITY): Payer: Medicare Other

## 2011-02-18 DIAGNOSIS — I719 Aortic aneurysm of unspecified site, without rupture: Secondary | ICD-10-CM

## 2011-02-18 DIAGNOSIS — I739 Peripheral vascular disease, unspecified: Secondary | ICD-10-CM

## 2011-02-18 LAB — POCT I-STAT 3, ART BLOOD GAS (G3+)
Patient temperature: 37.6
pH, Arterial: 7.345 — ABNORMAL LOW (ref 7.350–7.450)

## 2011-02-18 LAB — GLUCOSE, CAPILLARY
Glucose-Capillary: 233 mg/dL — ABNORMAL HIGH (ref 70–99)
Glucose-Capillary: 178 mg/dL — ABNORMAL HIGH (ref 70–99)
Glucose-Capillary: 147 mg/dL — ABNORMAL HIGH (ref 70–99)
Glucose-Capillary: 140 mg/dL — ABNORMAL HIGH (ref 70–99)

## 2011-02-18 LAB — CBC
HCT: 29.2 % — ABNORMAL LOW (ref 39.0–52.0)
Hemoglobin: 7.7 g/dL — ABNORMAL LOW (ref 13.0–17.0)
MCH: 25.5 pg — ABNORMAL LOW (ref 26.0–34.0)
MCHC: 31.5 g/dL (ref 30.0–36.0)
MCHC: 30.9 g/dL (ref 30.0–36.0)
Platelets: 122 10*3/uL — ABNORMAL LOW (ref 150–400)
RBC: 3.02 MIL/uL — ABNORMAL LOW (ref 4.22–5.81)
RDW: 14.6 % (ref 11.5–15.5)

## 2011-02-18 LAB — CK: Total CK: 85 U/L (ref 7–232)

## 2011-02-18 LAB — BASIC METABOLIC PANEL
Calcium: 7.7 mg/dL — ABNORMAL LOW (ref 8.4–10.5)
GFR calc non Af Amer: 74 mL/min — ABNORMAL LOW (ref >90)
Potassium: 4 mEq/L (ref 3.5–5.1)
Sodium: 142 mEq/L (ref 135–145)

## 2011-02-18 LAB — PHOSPHORUS: Phosphorus: 3.6 mg/dL (ref 2.3–4.6)

## 2011-02-18 LAB — COMPREHENSIVE METABOLIC PANEL
Albumin: 2.6 g/dL — ABNORMAL LOW (ref 3.5–5.2)
Alkaline Phosphatase: 49 U/L (ref 39–117)
BUN: 24 mg/dL — ABNORMAL HIGH (ref 6–23)
Potassium: 4.5 mEq/L (ref 3.5–5.1)
Total Protein: 5.1 g/dL — ABNORMAL LOW (ref 6.0–8.3)

## 2011-02-18 LAB — AMYLASE: Amylase: 92 U/L (ref 0–105)

## 2011-02-18 LAB — MAGNESIUM: Magnesium: 1.6 mg/dL (ref 1.5–2.5)

## 2011-02-19 ENCOUNTER — Inpatient Hospital Stay (HOSPITAL_COMMUNITY): Payer: Medicare Other

## 2011-02-19 DIAGNOSIS — J438 Other emphysema: Secondary | ICD-10-CM

## 2011-02-19 DIAGNOSIS — J95821 Acute postprocedural respiratory failure: Secondary | ICD-10-CM

## 2011-02-19 DIAGNOSIS — J810 Acute pulmonary edema: Secondary | ICD-10-CM

## 2011-02-19 DIAGNOSIS — I739 Peripheral vascular disease, unspecified: Secondary | ICD-10-CM

## 2011-02-19 LAB — GLUCOSE, CAPILLARY
Glucose-Capillary: 133 mg/dL — ABNORMAL HIGH (ref 70–99)
Glucose-Capillary: 164 mg/dL — ABNORMAL HIGH (ref 70–99)
Glucose-Capillary: 168 mg/dL — ABNORMAL HIGH (ref 70–99)
Glucose-Capillary: 121 mg/dL — ABNORMAL HIGH (ref 70–99)
Glucose-Capillary: 115 mg/dL — ABNORMAL HIGH (ref 70–99)

## 2011-02-19 LAB — COMPREHENSIVE METABOLIC PANEL
AST: 22 U/L (ref 0–37)
Albumin: 2.2 g/dL — ABNORMAL LOW (ref 3.5–5.2)
BUN: 21 mg/dL (ref 6–23)
Calcium: 8.1 mg/dL — ABNORMAL LOW (ref 8.4–10.5)
Creatinine, Ser: 0.85 mg/dL (ref 0.50–1.35)
Total Bilirubin: 0.5 mg/dL (ref 0.3–1.2)
Total Protein: 4.7 g/dL — ABNORMAL LOW (ref 6.0–8.3)

## 2011-02-19 LAB — PROTIME-INR
INR: 1.27 (ref 0.00–1.49)
Prothrombin Time: 16.2 seconds — ABNORMAL HIGH (ref 11.6–15.2)

## 2011-02-19 LAB — CBC
MCH: 26.6 pg (ref 26.0–34.0)
MCHC: 32.6 g/dL (ref 30.0–36.0)
Platelets: 103 10*3/uL — ABNORMAL LOW (ref 150–400)
RDW: 14.7 % (ref 11.5–15.5)

## 2011-02-19 LAB — MAGNESIUM: Magnesium: 1.7 mg/dL (ref 1.5–2.5)

## 2011-02-19 LAB — PHOSPHORUS: Phosphorus: 2 mg/dL — ABNORMAL LOW (ref 2.3–4.6)

## 2011-02-19 LAB — AMYLASE: Amylase: 271 U/L — ABNORMAL HIGH (ref 0–105)

## 2011-02-20 DIAGNOSIS — J810 Acute pulmonary edema: Secondary | ICD-10-CM

## 2011-02-20 DIAGNOSIS — J95821 Acute postprocedural respiratory failure: Secondary | ICD-10-CM

## 2011-02-20 DIAGNOSIS — J438 Other emphysema: Secondary | ICD-10-CM

## 2011-02-20 LAB — POCT I-STAT 7, (LYTES, BLD GAS, ICA,H+H)
Calcium, Ion: 1.13 mmol/L (ref 1.12–1.32)
Calcium, Ion: 1.09 mmol/L — ABNORMAL LOW (ref 1.12–1.32)
HCT: 34 % — ABNORMAL LOW (ref 39.0–52.0)
HCT: 33 % — ABNORMAL LOW (ref 39.0–52.0)
Hemoglobin: 11.2 g/dL — ABNORMAL LOW (ref 13.0–17.0)
Patient temperature: 35.9
Potassium: 3.7 mEq/L (ref 3.5–5.1)
Sodium: 141 mEq/L (ref 135–145)
pCO2 arterial: 42.5 mmHg (ref 35.0–45.0)
pO2, Arterial: 468 mmHg — ABNORMAL HIGH (ref 80.0–100.0)

## 2011-02-20 LAB — CBC
HCT: 28.7 % — ABNORMAL LOW (ref 39.0–52.0)
RBC: 3.46 MIL/uL — ABNORMAL LOW (ref 4.22–5.81)
RDW: 14.8 % (ref 11.5–15.5)
WBC: 8.3 10*3/uL (ref 4.0–10.5)

## 2011-02-20 LAB — BASIC METABOLIC PANEL
BUN: 19 mg/dL (ref 6–23)
CO2: 28 mEq/L (ref 19–32)
Chloride: 105 mEq/L (ref 96–112)
GFR calc Af Amer: >90 mL/min (ref >90)
Potassium: 3.9 mEq/L (ref 3.5–5.1)

## 2011-02-20 LAB — GLUCOSE, CAPILLARY
Glucose-Capillary: 140 mg/dL — ABNORMAL HIGH (ref 70–99)
Glucose-Capillary: 113 mg/dL — ABNORMAL HIGH (ref 70–99)
Glucose-Capillary: 157 mg/dL — ABNORMAL HIGH (ref 70–99)
Glucose-Capillary: 182 mg/dL — ABNORMAL HIGH (ref 70–99)

## 2011-02-20 LAB — MAGNESIUM: Magnesium: 1.8 mg/dL (ref 1.5–2.5)

## 2011-02-21 LAB — TYPE AND SCREEN
ABO/RH(D): B POS
Unit division: 0
Unit division: 0

## 2011-02-21 LAB — GLUCOSE, CAPILLARY
Glucose-Capillary: 170 mg/dL — ABNORMAL HIGH (ref 70–99)
Glucose-Capillary: 184 mg/dL — ABNORMAL HIGH (ref 70–99)
Glucose-Capillary: 180 mg/dL — ABNORMAL HIGH (ref 70–99)

## 2011-02-21 LAB — BASIC METABOLIC PANEL
BUN: 16 mg/dL (ref 6–23)
GFR calc non Af Amer: >90 mL/min (ref >90)
Glucose, Bld: 180 mg/dL — ABNORMAL HIGH (ref 70–99)
Potassium: 4 mEq/L (ref 3.5–5.1)

## 2011-02-21 NOTE — Op Note (Signed)
NAME:  HOSAM, MCFETRIDGE NO.:  [id number]  MEDICAL RECORD NO.:  [id number]  LOCATION:  2314                         FACILITY:  [facility]  PHYSICIAN:  Di Kindle. Edilia Bo, M.D.DATE OF BIRTH:  27-Oct-1940  DATE OF PROCEDURE:  02/17/2011 DATE OF DISCHARGE:                              OPERATIVE REPORT   PREOPERATIVE DIAGNOSES:  Abdominal aortic aneurysm, bilateral common iliac artery aneurysms, bilateral common femoral artery aneurysms, bilateral superficial femoral artery occlusions, and gangrene of the right foot.  POSTOPERATIVE DIAGNOSES:  Abdominal aortic aneurysm, bilateral common iliac artery aneurysms, bilateral common femoral artery aneurysms, bilateral superficial femoral artery occlusions, and gangrene of the right foot.  PROCEDURE: 1. Repair of abdominal aortic aneurysm and bilateral common iliac     artery aneurysms and bilateral common femoral artery aneurysms with     an aortobiprofunda bypass (16 x 8 Dacron graft). 2. Reimplantation of the inferior mesenteric artery. 3. Right femoral to posterior tibial artery bypass graft with a     nonreversed translocated saphenous vein graft.  SURGEON:  Di Kindle. Edilia Bo, MD  ASSISTANT:  Newton Pigg, PA  ANESTHESIA:  General.  INDICATIONS:  This is a 70 year old gentleman who presented to my office with gangrene of the right toes consistent with atheroembolic disease. By exam, had an abdominal aortic aneurysm.  This prompted a CT scan, which showed a large abdominal aortic aneurysm, bilateral common femoral artery aneurysms, and bilateral common iliac artery aneurysms.  In addition, he had bilateral hypogastric artery occlusions and bilateral superficial femoral artery occlusions.  However, he is also found have a colon cancer.  He underwent preoperative cardiac catheterization by Dr. Excell Seltzer and does have significant coronary disease, but it was felt that this could be managed medically  perioperatively while these more pressing issues were addressed.  He underwent successful colon resection by Dr. Luisa Hart and having recovered from that, he presents now for elective repair of his aneurysms and also fem-pop bypass grafting because of the gangrene of his right foot.  TECHNIQUE:  The patient was taken to the operative room and received a general anesthetic.  Monitoring lines had been placed by Anesthesia.  On the left groin, a longitudinal incision was noted made to expose the large left common femoral artery aneurysm.  I exposed up to the inguinal ligament.  There was really no normal segment of artery and I placed 2 large Tevdek sutures around the common femoral artery at the inguinal ligament level.  I started a retroperitoneal tunnel.  Distally, the superficial femoral artery was occluded.  The deep femoral artery was exposed and controlled with a vessel loop.  Next, attention was turned to the right groin.  Again, a longitudinal incision was made exposing a large right common femoral artery aneurysm.  Again, Tevdek sutures were placed around the artery proximally for ligation later.  Retroperitoneal tunnel was started.  The superficial femoral artery on the right was occluded and the deep femoral artery was controlled with a vessel loop.  Next, the abdomen was entered through a midline incision.  Upon exploratory laparotomy, there was no other intraabdominal pathology noted except for the large aneurysms.  The transverse colon was reflected superiorly and the small bowel reflected to  the right.  The retroperitoneal tissue was divided exposing the aorta, which was dissected free up to the level of the left renal vein.  The neck was reasonable and this was dissected free circumferentially.  The IMA was controlled with a vessel loop.  I divided the retroperitoneum laterally to the sigmoid colon for exposure of the large aneurysm and provide adequate visualization during  tunneling beneath the ureter.  A retroperitoneal tunnel was created on both sides and an umbilical tape passed.  The patient was then heparinized.  A clamp was placed proximally after both common femoral arteries were ligated with the Tevdek sutures.  The aneurysm was then opened and teed off proximally. Lumbars were oversewn with 2-0 silk ties.  Distally, I over sewed the common iliac artery origins with large 2-0 MH Prolene sutures.  Next, once good hemostasis was obtained, the area was teed off proximally circumferentially.  A 16 x 8 graft was selected.  It was cut to the appropriate length, then using a felt cuff sewn end-to-end to the infrarenal aorta using a continuous 3-0 Prolene suture.  The proximal anastomosis was tested and was hemostatic.  The graft was flushed and the aorta re-clamped.  The limbs were then brought to the respective tunnels.  Attention was 1st turned to the right femoral anastomosis. The deep femoral artery was clamped proximally.  There is a large aneurysm in the groin, was excised, and then the right limb of the graft cut to the appropriate length, spatulated, and sewn to the common origin of the deep femoral artery using continuous 5-0 Prolene suture.  Prior to completing this anastomosis, the arteries were back bled and flushed appropriately, the anastomosis completed and flow reestablished to the right leg.  Next on the left side again, the proximal common femoral artery had been ligated.  The deep femoral artery was clamped and then this large aneurysm was excised.  There was good backbleeding from the deep femoral artery.  The left limb of the graft was cut to the appropriate length and sewn end-to-end to the deep femoral artery on the left with continuous 5-0 Prolene suture.  Prior to completing the anastomosis, the arteries were back bled and flushed appropriately, and the anastomosis completed.  Flow was reestablished to the left leg, which the  patient tolerated from a hemodynamic standpoint.  Next, attention was turned to the inferior mesenteric artery.  This was dissected free for an anastomosis to the left limb of the graft. However, there was a large plaque present here, and I had to endarterectomize the IMA in order to allow the vessel to be able amenable to anastomosis.  There was a reasonable back bleeding from the IMA.  Once the endarterectomy was performed, the left limb of the graft was clamped proximally and distally and then a longitudinal graftotomy made.  The IMA was sewn end-to-side to the left limb of the graft using continuous 6-0 Prolene suture.  Prior to completing this anastomosis, the vessels were back bled and flushed, the anastomosis completed.  Of note, the patient had received an additional 2000 units of IV heparin before clamping the left limb of the graft again.  Next, attention was turned to proceeding with fem-pop bypass graft on the right given the gangrene of the right foot.  Using three additional incisions along the medial aspect of the right leg, the greater saphenous vein was harvested down to the proximal calf.  Branches were divided between clips and 3-0 silk ties.  It was  a good size vein.  Distally, the vein was ligated and divided and was brought to the tunnel and then the saphenofemoral junction was clamped.  The saphenous vein excised from the femoral vein. The femoral vein oversewn with a 5-0 Prolene suture.  This was stored in papaverine.  Through the distal incision, the posterior tibial artery was exposed.  The patient had a high takeoff of the posterior tibial artery.  The artery was soft at this level.  A tunnel was created from the below-knee incision to the groin incision.  The patient did receive some additional heparin 2000 units.  Next, the deep femoral artery in the right groin was clamped again, and the right limb of the graft clamped.  A longitudinal graftotomy was made in  the graft and then the proximal valve in the vein was excised and then the vein sewn in a nonreversed fashion end-to-side to the right limb of the graft using continuous 6-0 Prolene suture.  The graft was then flushed with heparinized saline and then a retrograde Mills valvulotome used to complete valve lysis.  There was excellent flow established to the graft.  The vein was then marked to prevent twisting and then brought through the previously created tunnel.  Tourniquet was placed on the thigh and the leg was exsanguinated with an Esmarch bandage.  The tourniquet inflated to 300 mmHg.  Under tourniquet control, longitudinal arteriotomy was made in the posterior tibial artery on the right and the vein graft was cut to the appropriate length, spatulated, and sewn end- to-side to the posterior tibial artery using continuous 6-0 Prolene suture.  At the completion, there was an excellent signal in the foot with the Doppler.  Heparin was partially reversed with protamine. Attention was first turned to the abdomen.  Hemostasis was obtained and then the aortic sac was closed over the graft with running 2-0 Vicryl. The retroperitoneal tissue was closed with running 2-0 Vicryl.  The abdominal contents returned in normal position.  The fascial layer was closed with two #1 PDS sutures.  The subcutaneous tissue was closed with two running 3-0 Vicryl.  The skin was closed with staples.  Hemostasis was then obtained in the right groin, and the right groin was closed with a deep layer of 2-0 Vicryl, subcutaneous layer of 2-0 Vicryl, and the skin closed with a 4-0 subcuticular stitch.  In the left groin, the hemostasis was obtained.  The wound was closed with deep layer of 2-0 Vicryl, the subcutaneous layer with 2-0 Vicryl, and the skin closed with 4-0 subcuticular stitch.  The three incisions along the medial aspect of the right leg were closed with a deep layer of 3-0 Vicryl, and the skin closed with  staples.  The patient tolerated the procedure well.  All needle and sponge counts were correct.  He was transferred to the recovery room in stable condition.     Di Kindle. Edilia Bo, M.D.     CSD/MEDQ  D:  02/17/2011  T:  02/17/2011  Job:  [id number]  cc:   Veverly Fells. Excell Seltzer, MD Clovis Pu. Cornett, M.D. B. Theola Sequin, MD  Electronically Signed by Waverly Ferrari M.D. on 02/21/2011 02:50:05 PM

## 2011-02-22 LAB — GLUCOSE, CAPILLARY
Glucose-Capillary: 114 mg/dL — ABNORMAL HIGH (ref 70–99)
Glucose-Capillary: 116 mg/dL — ABNORMAL HIGH (ref 70–99)

## 2011-02-23 LAB — GLUCOSE, CAPILLARY: Glucose-Capillary: 154 mg/dL — ABNORMAL HIGH (ref 70–99)

## 2011-02-24 LAB — GLUCOSE, CAPILLARY: Glucose-Capillary: 138 mg/dL — ABNORMAL HIGH (ref 70–99)

## 2011-02-25 LAB — GLUCOSE, CAPILLARY
Glucose-Capillary: 164 mg/dL — ABNORMAL HIGH (ref 70–99)
Glucose-Capillary: 161 mg/dL — ABNORMAL HIGH (ref 70–99)
Glucose-Capillary: 195 mg/dL — ABNORMAL HIGH (ref 70–99)

## 2011-02-26 LAB — CBC
Hemoglobin: 9.6 g/dL — ABNORMAL LOW (ref 13.0–17.0)
MCH: 26.2 pg (ref 26.0–34.0)
MCHC: 30.5 g/dL (ref 30.0–36.0)
MCV: 85.8 fL (ref 78.0–100.0)
RBC: 3.67 MIL/uL — ABNORMAL LOW (ref 4.22–5.81)

## 2011-02-26 LAB — BASIC METABOLIC PANEL
BUN: 19 mg/dL (ref 6–23)
CO2: 31 mEq/L (ref 19–32)
Calcium: 8.3 mg/dL — ABNORMAL LOW (ref 8.4–10.5)
Creatinine, Ser: 0.81 mg/dL (ref 0.50–1.35)
GFR calc non Af Amer: 88 mL/min — ABNORMAL LOW (ref >90)
Glucose, Bld: 127 mg/dL — ABNORMAL HIGH (ref 70–99)

## 2011-02-26 LAB — GLUCOSE, CAPILLARY
Glucose-Capillary: 202 mg/dL — ABNORMAL HIGH (ref 70–99)
Glucose-Capillary: 171 mg/dL — ABNORMAL HIGH (ref 70–99)

## 2011-02-27 ENCOUNTER — Inpatient Hospital Stay (HOSPITAL_COMMUNITY): Payer: Medicare Other

## 2011-02-27 LAB — GLUCOSE, CAPILLARY

## 2011-02-28 LAB — GLUCOSE, CAPILLARY: Glucose-Capillary: 124 mg/dL — ABNORMAL HIGH (ref 70–99)

## 2011-03-01 ENCOUNTER — Ambulatory Visit: Payer: Medicare Other | Admitting: Cardiovascular Disease

## 2011-03-02 LAB — GLUCOSE, CAPILLARY: Glucose-Capillary: 196 mg/dL — ABNORMAL HIGH (ref 70–99)

## 2011-03-03 ENCOUNTER — Telehealth: Payer: Self-pay

## 2011-03-03 DIAGNOSIS — R197 Diarrhea, unspecified: Secondary | ICD-10-CM

## 2011-03-03 MED ORDER — METRONIDAZOLE 500 MG PO TABS: 500.0000 mg | ORAL_TABLET | Freq: Three times a day (TID) | ORAL | Status: AC

## 2011-03-03 NOTE — Telephone Encounter (Signed)
[facility] nurse reports that pt. Has had six diarrhea stools since last night.  Reports VS:  T. 99.4, P 72, R 24, BP 120/58.  States pt. has no abd. discomfort, no nausea or vomiting, and has positive BS.  States that pt. Thinks he ate something that upset his system.  Has been on Keflex 500 mg tid.  (pt. is s/p rep of AAA, bilat. C. Iliac Aneur, and bilat C. Fem art aneur. of 10/12) Discussed w/ Dr. Imogene Burn and rec'd orders for Flagyl 500 mg tid x 10 days, and obtain stool for C-Diff.  Daughter-in-law notified, and will take pt. to [facility]- Lab to submit stool culture for C-diff.

## 2011-03-09 ENCOUNTER — Encounter: Payer: Self-pay | Admitting: Thoracic Diseases

## 2011-03-10 ENCOUNTER — Ambulatory Visit (INDEPENDENT_AMBULATORY_CARE_PROVIDER_SITE_OTHER): Payer: Medicare Other | Admitting: Thoracic Diseases

## 2011-03-10 ENCOUNTER — Encounter: Payer: Self-pay | Admitting: Thoracic Diseases

## 2011-03-10 VITALS — BP 169/72 | HR 59 | Temp 98.5°F

## 2011-03-10 DIAGNOSIS — I739 Peripheral vascular disease, unspecified: Secondary | ICD-10-CM

## 2011-03-10 DIAGNOSIS — Z8679 Personal history of other diseases of the circulatory system: Secondary | ICD-10-CM

## 2011-03-10 DIAGNOSIS — Z9889 Other specified postprocedural states: Secondary | ICD-10-CM

## 2011-03-10 NOTE — Progress Notes (Signed)
[facility] OF   Post-op AAA Repair Date of Surgery: 02/17/11  - Open AAA Repair and right fem-pop  Surgeon: CS Edilia Bo, md  History of Present Illness  Peter Harvey is a 70 y.o. male who is 2 weeks s/p Open of Abdominal Aortic Aneurysm .m  He is doing well with good appetite. He just feels weak. No C/O leg pain. Pt is doing well. denies incisional pain; denies nausea/vomiting; has had Diarrhea post-op and was placed on Flagyl by Dr. Imogene Burn last Friday.  The diarrhea is better and he now is having formed stools. Pt family was unable to get stool to [facility] for C. Diff culture. Pt. Family also states he has had loose stools since his colon resection for cancer. They will continue flagyl for full 10 day course and if pt. symptoms return they will take stool specimen to [facility] for culture.has had flatus;has had BM   Physical Examination  General: A&O x 3, WDWN male in NAD HENT: WNL Eyes: Pupils equal Pulmonary: normal non-labored breathing , without Rales, rhonchi,  wheezing Cardiac: Heart rate : regular , without  Murmurs,  Abdomen:abdomen soft, non-tender and normal active bowel sounds Abdominal wound:clean, dry, intact Vascular Exam/Pulses:BLE warm and well perfused, groin wounds are healing well.  Staples removed without difficulty Extremities without ischemic changes, no Gangrene , no cellulitis; no open wounds;  Musculoskeletal: no muscle wasting or atrophy  Neurologic: A&O X 3; Appropriate Affect ; SENSATION: normal; MOTOR FUNCTION:  moving all extremities equally. Speech is fluent/normal  LOWER EXTREMITY PULSES           RIGHT                                      LEFT      POSTERIOR TIBIAL doppler palpable       DORSALIS PEDIS      ANTERIOR TIBIAL doppler doppler   Assessment: Peter Harvey is a 69 y.o. male who is 3 days s/p  Open repair of Abdominal Aortic Aneurysm . He is doing well He is beng treated with Flagyl for diarrhea and  this is resolving, C. Diff not confirmed as family did not get specimen to lab.  Pt has had some chronic loose stools after colon resection Pt has good perfusion to BLE with palp PT after fem-pop

## 2011-03-14 NOTE — Discharge Summary (Signed)
NAME:  Peter Harvey, Peter Harvey NO.:  [id number]  MEDICAL RECORD NO.:  [id number]  LOCATION:  2032                         FACILITY:  [facility]  PHYSICIAN:  Di Kindle. Edilia Bo, M.D.DATE OF BIRTH:  22-Jun-1940  DATE OF ADMISSION:  02/17/2011 DATE OF DISCHARGE:  02/28/2011                              DISCHARGE SUMMARY   ADMISSION DIAGNOSES: 1. Abdominal aortic aneurysm. 2. Bilateral common iliac artery aneurysm. 3. Bilateral common femoral artery aneurysms. 4. Bilateral superficial femoral artery occlusion. 5. Gangrene of the right foot.  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  By exam, he had an abdominal aortic aneurysm.  This prompted a CT scan which revealed a large abdominal aortic aneurysm, bilateral common femoral artery aneurysms, and bilateral common iliac artery aneurysms.  In addition, he had bilateral hypogastric artery occlusions and bilateral superficial femoral artery occlusion.  However, he was also found to have colon cancer.  He underwent preoperative cardiac catheterization by Dr. Excell Seltzer and does have significant coronary disease, but it was felt this could be managed medically perioperatively while these more pressing issues were addressed.  He underwent successful colon resection by Dr. Luisa Hart and having recovered from that he presents for elective repair of his aneurysm and also fem-pop bypass grafting because of gangrene to his right foot.  HOSPITAL COURSE:  The patient was admitted to the hospital and taken to the operating room on February 17, 2011 where he underwent a repair of abdominal aortic aneurysm and bilateral common iliac aneurysms and bilateral common femoral artery aneurysms with aorto bi-profunda bypass as well as reimplantation of the inferior mesenteric artery and right femoral to posterior tibial artery bypass  grafting with a nonreversed translocated saphenous vein graft.  He tolerated the procedure well and was transported to the recovery room in satisfactory condition.  The patient was then transferred to ICU.  Critical Care Management was also involved due to his history of COPD.  He was doing well and by postoperative day #3, his diet was started slowly.  Cardiac rehab was consulted and he was overall doing well.  His right foot was warm and his toes were stable.  His Foley was removed on postoperative day #4, however, the patient was unable to void and the patient's Foley was replaced.  He was then started on Flomax and the Foley was discontinued 24 hours later and the patient has not had any trouble voiding since then.  Otherwise his postoperative course has included increasing intake of solids without difficulty.  It has included physical therapy.  It was thought that the patient will have to go to a skilled nursing facility, however, the patient has great family support and has hired someone to be with the patient daily and we will also order home health for physical and occupational therapy as well.  By postoperative day #10, the patient did have some erythema around his thigh incisions but Dr. Edilia Bo felt that this was due to irritation from the staples and every other staple was removed.  He is discharged home on postoperative day #11.  Before discharge, a diabetic coordinator consult was  obtained to tweak his diabetic medications.  DISCHARGE INSTRUCTIONS:  The patient is discharged home with extensive instructions on wound care and physical therapy.  The patient will receive home physical and occupational therapy.  He is instructed not to drive or perform any heavy lifting.  DISCHARGE DIAGNOSES: 1. Abdominal aortic aneurysm with bilateral common iliac artery     aneurysms, bilateral common femoral artery aneurysms, and bilateral     superficial femoral artery occlusions and  gangrene of the right     foot.     a.     Status post repair of abdominal aortic aneurysm and      bilateral common iliac artery aneurysms and bilateral common      femoral artery aneurysms with aorto bi-profunda bypass graft as      well as reimplantation of the inferior mesenteric artery and right      femoral to posterior tibial artery bypass on February 17, 2011. 2. Coronary artery disease.     a.     Status post stenting of right coronary artery. 3. Diabetes. 4. Hypothyroidism. 5. Hyperlipidemia. 6. Congestive heart failure. 7. Chronic obstructive pulmonary disease. 8. History of colon cancer.  DISCHARGE MEDICATIONS: 1. Albuterol 1-2 puffs q.4 hours p.r.n. 2. Diabetes starter kit. 3. Lantus Solo pen 10 units subcu at bedtime. 4. Insulin aspart 3 units subcu t.i.d. with meals.  This is not to be     given if the patient does not eat. 5. Flomax 0.4 mg p.o. daily. 6. Percocet 5/325, 1 tablet p.o. q.4-6 hours p.r.n. pain, #30, no     refills. 7. Aspirin 81 mg p.o. daily. 8. Atenolol 50 mg p.o. daily. 9. Keflex 500 mg p.o. t.i.d. 10.Glyburide/metformin 5/500, 1 p.o. b.i.d. 11.Levothyroxine 100 mcg p.o. daily. 12.MiraLax 17 g p.o. daily p.r.n. 13.Quinapril 20 mg 2 tablets p.o. daily.  FOLLOWUP: 1. The patient is to follow up with Dr. Edilia Bo in 2 weeks. 2. The patient is to follow up with his primary care doctor in 3 weeks     for diabetes, medical management, and review of his current     medications.     Newton Pigg, PA   ______________________________ Di Kindle. Edilia Bo, M.D.    SE/MEDQ  D:  02/28/2011  T:  02/28/2011  Job:  [id number]  Electronically Signed by Newton Pigg PA on 03/09/2011 07:38:58 PM Electronically Signed by Waverly Ferrari M.D. on 03/14/2011 02:11:00 PM

## 2011-03-21 ENCOUNTER — Encounter: Payer: Self-pay | Admitting: Vascular Surgery

## 2011-03-22 ENCOUNTER — Encounter: Payer: Self-pay | Admitting: Vascular Surgery

## 2011-03-22 ENCOUNTER — Ambulatory Visit (INDEPENDENT_AMBULATORY_CARE_PROVIDER_SITE_OTHER): Payer: Medicare Other | Admitting: Vascular Surgery

## 2011-03-22 VITALS — BP 179/86 | HR 62 | Temp 97.8°F | Resp 14 | Ht 70.0 in | Wt 164.0 lb

## 2011-03-22 DIAGNOSIS — Z9889 Other specified postprocedural states: Secondary | ICD-10-CM

## 2011-03-22 DIAGNOSIS — Z95828 Presence of other vascular implants and grafts: Secondary | ICD-10-CM

## 2011-03-22 DIAGNOSIS — I714 Abdominal aortic aneurysm, without rupture: Secondary | ICD-10-CM

## 2011-03-22 DIAGNOSIS — I70219 Atherosclerosis of native arteries of extremities with intermittent claudication, unspecified extremity: Secondary | ICD-10-CM

## 2011-03-22 NOTE — Progress Notes (Signed)
[facility] [place]  Patient name: Peter Harvey MRN: [id number] DOB: 1941/04/12 Sex: male  CC: followup after open abdominal aortic aneurysm repair and right femoropopliteal bypass.  HPI: Peter Harvey is a 70 y.o. male underwent open repair of an abdominal aortic aneurysm and a right femoropopliteal bypass on 02/17/2011. He had an abdominal aortic aneurysm bilateral common iliac artery aneurysms and bilateral femoral artery aneurysms. 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 He comes in for a routine visit. He had some problems with diarrhea but this has resolved after a course of Flagyl. A previous resection of a colon cancer. He has no specific complaints except for generalized weakness which is gradually improving.  Past Medical History  Diagnosis Date  . HTN (hypertension)   . DM (diabetes mellitus)   . Hypothyroidism   . Hernia   . Dizziness   . AAA (abdominal aortic aneurysm)     Dr. Edilia Bo  . Hyperlipidemia   . Leg pain     with walking  . 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 CAD (coronary artery disease)     EF 57% by Myoview 4/12;  cath 5/12: dLM 50%, pLAD 50%, pCFX 50%, OM1 occluded with distal filling with L-L collats, mRCA 70-75%, dRCA 80%;  severe RCA needs PCI but treated medically due to need for colon surgery and plans to possilby perform PCI in future  . PAD (peripheral artery disease)     c/b dry gangrene of toes  . CHF (congestive heart failure)   . COPD (chronic obstructive pulmonary disease)     Family History  Problem Relation Age of Onset  . Diabetes Mother   . Heart disease Mother     heart failure  . Heart disease Brother   . Other Brother 72    MRSA  . COPD Father   . Heart disease Father     heart failure  . COPD Sister     SOCIAL HISTORY: History  Substance Use Topics  . Smoking status: Current Some Day Smoker -- 50  years    Types: Cigarettes    Last Attempt to Quit: 12/11/2010  . Smokeless tobacco: Never Used  . Alcohol Use: No    No Known Allergies  Current Outpatient Prescriptions  Medication Sig Dispense Refill  . aspirin 81 MG EC tablet Take 81 mg by mouth daily.        [name] atenolol (TENORMIN) 50 MG tablet Take 50 mg by mouth daily.        . cephALEXin (KEFLEX) 500 MG capsule Take 500 mg by mouth 3 (three) times daily.        . famotidine (PEPCID) 20 MG tablet Take 20 mg by mouth once.       . glyBURIDE-metformin (GLUCOVANCE) 5-500 MG per tablet Take 1 tablet by mouth 2 (two) times daily.       [name] levothyroxine (SYNTHROID, LEVOTHROID) 100 MCG tablet Take 100 mcg by mouth daily.        [name] NOVOLOG 100 UNIT/ML injection       . oxyCODONE-acetaminophen (PERCOCET) 5-325 MG per tablet Take 1 tablet by mouth every 4 (four) hours as needed. For pain      . polyethylene glycol (MIRALAX / GLYCOLAX) packet Take 17 g by mouth daily.        . quinapril (ACCUPRIL) 20 MG tablet Take 2 tablets (  40 mg total) by mouth daily.  60 tablet  11    REVIEW OF SYSTEMS: Arly.Keller ] denotes positive finding; [  ] denotes negative finding CARDIOVASCULAR:  [ ]  chest pain   [ ]  chest pressure   [ ]  palpitations   [ ]  orthopnea   [ ]  dyspnea on exertion   [ ]  claudication   [ ]  rest pain   [ ]  DVT   [ ]  phlebitis PULMONARY:   [ ]  productive cough   [ ]  asthma   [ ]  wheezing NEUROLOGIC:   [ ]  weakness  [ ]  paresthesias  [ ]  aphasia  [ ]  amaurosis  [ ]  dizziness HEMATOLOGIC:   [ ]  bleeding problems   [ ]  clotting disorders MUSCULOSKELETAL:  [ ]  joint pain   [ ]  joint swelling [ ]  leg swelling GASTROINTESTINAL: [X]   blood in stool- 1 episode, mild, no further  [ ]   hematemesis GENITOURINARY:  [ ]   dysuria  [ ]   hematuria PSYCHIATRIC:  [ ]  history of major depression INTEGUMENTARY:  [ ]  rashes  [ ]  ulcers CONSTITUTIONAL:  [ ]  fever   [ ]  chills  PHYSICAL EXAM: Filed Vitals:   03/22/11 0940  BP: 179/86  Pulse: 62  Temp: 97.8 F  (36.6 C)  TempSrc: Oral  Resp: 14  Height: 5\' 10"  (1.778 m)  Weight: 164 lb (74.39 kg)  SpO2: 95%   Body mass index is 23.53 kg/(m^2). GENERAL: The patient is a well-nourished male, in no acute distress. The vital signs are documented above. CARDIOVASCULAR: There is a regular rate and rhythm without significant murmur appreciated. I do not detect any carotid bruits.  He has palpable femoral pulses are palpable right popliteal pulse. PULMONARY: There is good air exchange bilaterally without wheezing or rales. ABDOMEN: Soft and non-tender with normal pitched bowel sounds. His abdominal incision and groin incisions are healing nicely. MUSCULOSKELETAL: There are no major deformities or cyanosis. NEUROLOGIC: No focal weakness or paresthesias are detected. SKIN: He has dry gangrene of the tips of the third and fourth toes without cellulitis or drainage. PSYCHIATRIC: The patient has a normal affect.  MEDICAL ISSUES: 1.  Overall pleased with his progress. He has been doing home health PT in his bed progress with this but still need some continued work. Ordered for him to continue home health PT twice a week for now. Our goal is for him to the ambulatory with a walker. 2. Of note while he was in the hospital which CBGs were elevated and this reason we had to start insulin. Family states it is much or 7 much better controlled and they're hopeful that he can get off of the insulin if possible. Note this is very expensive. I've explained all have to defer this to his primary care physician Dr.William McGough. 3. See him back in 2 months. He knows to call sooner for has problems.  Peter Harvey S [facility] [place] Office: [phone]

## 2011-03-27 ENCOUNTER — Encounter: Payer: Self-pay | Admitting: Cardiovascular Disease

## 2011-04-09 ENCOUNTER — Emergency Department (HOSPITAL_COMMUNITY)
Admission: EM | Admit: 2011-04-09 | Discharge: 2011-04-09 | Disposition: A | Discharge status: Home / Self Care | Payer: Medicare Other | Attending: Emergency Medicine | Admitting: Emergency Medicine

## 2011-04-09 ENCOUNTER — Encounter (HOSPITAL_COMMUNITY): Payer: Self-pay | Admitting: *Deleted

## 2011-04-09 ENCOUNTER — Emergency Department (HOSPITAL_COMMUNITY): Payer: Medicare Other

## 2011-04-09 DIAGNOSIS — E119 Type 2 diabetes mellitus without complications: Secondary | ICD-10-CM | POA: Insufficient documentation

## 2011-04-09 DIAGNOSIS — S99929A Unspecified injury of unspecified foot, initial encounter: Secondary | ICD-10-CM

## 2011-04-09 DIAGNOSIS — J4489 Other specified chronic obstructive pulmonary disease: Secondary | ICD-10-CM | POA: Insufficient documentation

## 2011-04-09 DIAGNOSIS — Z9889 Other specified postprocedural states: Secondary | ICD-10-CM | POA: Insufficient documentation

## 2011-04-09 DIAGNOSIS — S98139A Complete traumatic amputation of one unspecified lesser toe, initial encounter: Secondary | ICD-10-CM | POA: Insufficient documentation

## 2011-04-09 DIAGNOSIS — S8990XA Unspecified injury of unspecified lower leg, initial encounter: Secondary | ICD-10-CM | POA: Insufficient documentation

## 2011-04-09 DIAGNOSIS — I739 Peripheral vascular disease, unspecified: Secondary | ICD-10-CM | POA: Insufficient documentation

## 2011-04-09 DIAGNOSIS — I10 Essential (primary) hypertension: Secondary | ICD-10-CM | POA: Insufficient documentation

## 2011-04-09 DIAGNOSIS — Z7982 Long term (current) use of aspirin: Secondary | ICD-10-CM | POA: Insufficient documentation

## 2011-04-09 DIAGNOSIS — Z85038 Personal history of other malignant neoplasm of large intestine: Secondary | ICD-10-CM | POA: Insufficient documentation

## 2011-04-09 DIAGNOSIS — E039 Hypothyroidism, unspecified: Secondary | ICD-10-CM | POA: Insufficient documentation

## 2011-04-09 DIAGNOSIS — I2510 Atherosclerotic heart disease of native coronary artery without angina pectoris: Secondary | ICD-10-CM | POA: Insufficient documentation

## 2011-04-09 DIAGNOSIS — E785 Hyperlipidemia, unspecified: Secondary | ICD-10-CM | POA: Insufficient documentation

## 2011-04-09 DIAGNOSIS — Y92009 Unspecified place in unspecified non-institutional (private) residence as the place of occurrence of the external cause: Secondary | ICD-10-CM | POA: Insufficient documentation

## 2011-04-09 DIAGNOSIS — IMO0002 Reserved for concepts with insufficient information to code with codable children: Secondary | ICD-10-CM | POA: Insufficient documentation

## 2011-04-09 DIAGNOSIS — J449 Chronic obstructive pulmonary disease, unspecified: Secondary | ICD-10-CM | POA: Insufficient documentation

## 2011-04-09 DIAGNOSIS — Z23 Encounter for immunization: Secondary | ICD-10-CM | POA: Insufficient documentation

## 2011-04-09 DIAGNOSIS — I509 Heart failure, unspecified: Secondary | ICD-10-CM | POA: Insufficient documentation

## 2011-04-09 MED ORDER — TETANUS-DIPHTH-ACELL PERTUSSIS 5-2.5-18.5 LF-MCG/0.5 IM SUSP
0.5000 mL | Freq: Once | INTRAMUSCULAR | Status: AC
  Administered 2011-04-09: 0.5 mL via INTRAMUSCULAR
  Filled 2011-04-09: qty 0.5

## 2011-04-09 NOTE — ED Notes (Signed)
Wet to dry dressing applied to the right middle toe & wrapped.

## 2011-04-09 NOTE — ED Provider Notes (Signed)
History  Scribed for Peter Gaskins, MD, the patient was seen in [facility]. The chart was scribed by Gilman Schmidt. The patients care was started at 6:55 PM.   CSN: [id number] Arrival date & time: 04/09/2011  6:15 PM   First MD Initiated Contact with Patient 04/09/11 1846      Chief Complaint  Patient presents with  . Toe Injury    HPI Peter Harvey is a 70 y.o. male with a history of multiple illness including DM who presents to the Emergency Department complaining of right third toe injury. Patient was getting ready to get into bathtub and hit right middle toe on side of tub. States "the black part of the toe came off and began to bleed".  Patients family member wrapped toe and stopped bleeding. Pt is on ASA. Pt denies any fall. Pt denies any history of heart attack or stroke. Pts reports Gangrene in last six months. Denies any weakness or any other pain. Pt is currently on antibiotics and has up to date Tetanus.   Past Medical History  Diagnosis Date  . HTN (hypertension)   . DM (diabetes mellitus)   . Hypothyroidism   . Hernia   . Dizziness   . AAA (abdominal aortic aneurysm)     Dr. Edilia Bo  . Hyperlipidemia   . Leg pain     with walking  . 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 CAD (coronary artery disease)     EF 57% by Myoview 4/12;  cath 5/12: dLM 50%, pLAD 50%, pCFX 50%, OM1 occluded with distal filling with L-L collats, mRCA 70-75%, dRCA 80%;  severe RCA needs PCI but treated medically due to need for colon surgery and plans to possilby perform PCI in future  . PAD (peripheral artery disease)     c/b dry gangrene of toes  . CHF (congestive heart failure)   . COPD (chronic obstructive pulmonary disease)     Past Surgical History  Procedure Date  . Hernia repair   . Appendectomy   . Cardiac catheterization   . Colon surgery 12/12/10    r/t colon CA  . Abdominal surgery   . Abdominal aortic aneurysm repair 02/17/11    Open AAA repair  and Right Fem-Pop    Family History  Problem Relation Age of Onset  . Diabetes Mother   . Heart disease Mother     heart failure  . Heart disease Brother   . Other Brother 72    MRSA  . COPD Father   . Heart disease Father     heart failure  . COPD Sister     History  Substance Use Topics  . Smoking status: Former Smoker -- 50 years    Types: Cigarettes    Quit date: 12/11/2010  . Smokeless tobacco: Never Used  . Alcohol Use: No      Review of Systems  Musculoskeletal:       Toe injury   Neurological: Negative for weakness.  All other systems reviewed and are negative.    Allergies  Review of patient's allergies indicates no known allergies.  Home Medications   Current Outpatient Rx  Name Route Sig Dispense Refill  . ASPIRIN EC 81 MG PO TBEC Oral Take 81 mg by mouth daily.      . ATENOLOL 50 MG PO TABS Oral Take 50 mg by mouth daily.      . CEPHALEXIN 500 MG PO CAPS Oral Take  500 mg by mouth 3 (three) times daily.      [name] FAMOTIDINE 20 MG PO TABS Oral Take 20 mg by mouth at bedtime.     . GLYBURIDE-METFORMIN 5-500 MG PO TABS Oral Take 1 tablet by mouth 2 (two) times daily.     [name] LANTUS 100 UNIT/ML [place] SOLN Subcutaneous Inject 10 Units into the skin at bedtime.    [name] LEVOTHYROXINE SODIUM 100 MCG PO TABS Oral Take 100 mcg by mouth daily.      [name] NOVOLOG 100 UNIT/ML [place] SOLN Subcutaneous Inject 3 Units into the skin 3 (three) times daily before meals.     . QUINAPRIL HCL 20 MG PO TABS Oral Take 2 tablets (40 mg total) by mouth daily. 60 tablet 11  . TAMSULOSIN HCL 0.4 MG PO CAPS Oral Take 0.4 mg by mouth at bedtime.     [name] POLYETHYLENE GLYCOL 3350 PO PACK Oral Take 17 g by mouth daily.        BP 163/116  Pulse 70  Temp(Src) 98.4 F (36.9 C) (Oral)  Resp 19  Ht 5' 10.5" (1.791 m)  Wt 170 lb (77.111 kg)  BMI 24.05 kg/m2  SpO2 96%  Physical Exam CONSTITUTIONAL: Well developed/well nourished HEAD AND FACE: Normocephalic/atraumatic EYES: EOMI/PERRL ENMT: Mucous  membranes moist NECK: supple no meningeal signs SPINE:entire spine nontender CV: S1/S2 noted, no murmurs/rubs/gallops noted LUNGS: Lungs are clear to auscultation bilaterally, no apparent distress ABDOMEN: soft, nontender, no rebound or guarding GU:no cva tenderness NEURO: Pt is awake/alert, moves all extremitiesx4 EXTREMITIES: pulses normal, full ROM,  SKIN: warm, color normal, dried blood at tip of third right toe, no active bleeding, nail bed removed,  no crepitus PSYCH: no abnormalities of mood noted   ED Course  Procedures    DIAGNOSTIC STUDIES: Oxygen Saturation is 99% on room air, normal by my interpretation.    COORDINATION OF CARE: 6:55pm:  - Patient evaluated by ED physician, DG Foot ordered  Radiology: DG Foot Complete Right. Reviewed by me. IMPRESSION: 1. Prior partial amputation of the third toe. 2. No obvious plain film findings for osteomyelitis. Original Report Authenticated By: P. Loralie Champagne, M.D.   D/w dr Hilda Lias, discussed xray, continue abx, wound care, f/u this week Pt will f/u with his own orthopedist He is comfortable with plan  MDM  Nursing notes reviewed and considered in documentation xrays reviewed and considered    I personally performed the services described in this documentation, which was scribed in my presence. The recorded information has been reviewed and considered.         Peter Gaskins, MD 04/09/11 2024

## 2011-04-09 NOTE — ED Notes (Signed)
Pt was getting ready to get into bathtub and hit right middle toe on side of tub; the black part of the toe came off and began to bleed; pt's family member wrapped toe and stopped bleeding; bleeding has stopped at this time and toe has been rewrapped

## 2011-04-09 NOTE — ED Notes (Signed)
Pt c/o injury to right middle toe. Pts toe is wrapped at this time will wait for md to unwrap toe.

## 2011-04-09 NOTE — ED Notes (Signed)
Pt given discharge instructions, paperwork. pt verbalized understanding.   

## 2011-05-04 ENCOUNTER — Encounter: Payer: Self-pay | Admitting: Cardiovascular Disease

## 2011-05-24 ENCOUNTER — Ambulatory Visit: Payer: Medicare Other | Admitting: Vascular Surgery

## 2011-05-30 ENCOUNTER — Ambulatory Visit: Payer: Medicare Other | Admitting: Cardiovascular Disease

## 2011-06-11 IMAGING — CR DG CHEST 2V
3 series · 3 of 3 positions shown · non-contrast
Comparison: 11/23/2003

CLINICAL DATA: Heart cath scheduled for 09/09/2010.  History of
smoking, hypertension.

CHEST - 2 VIEW

[view not recorded (1 of 3)]
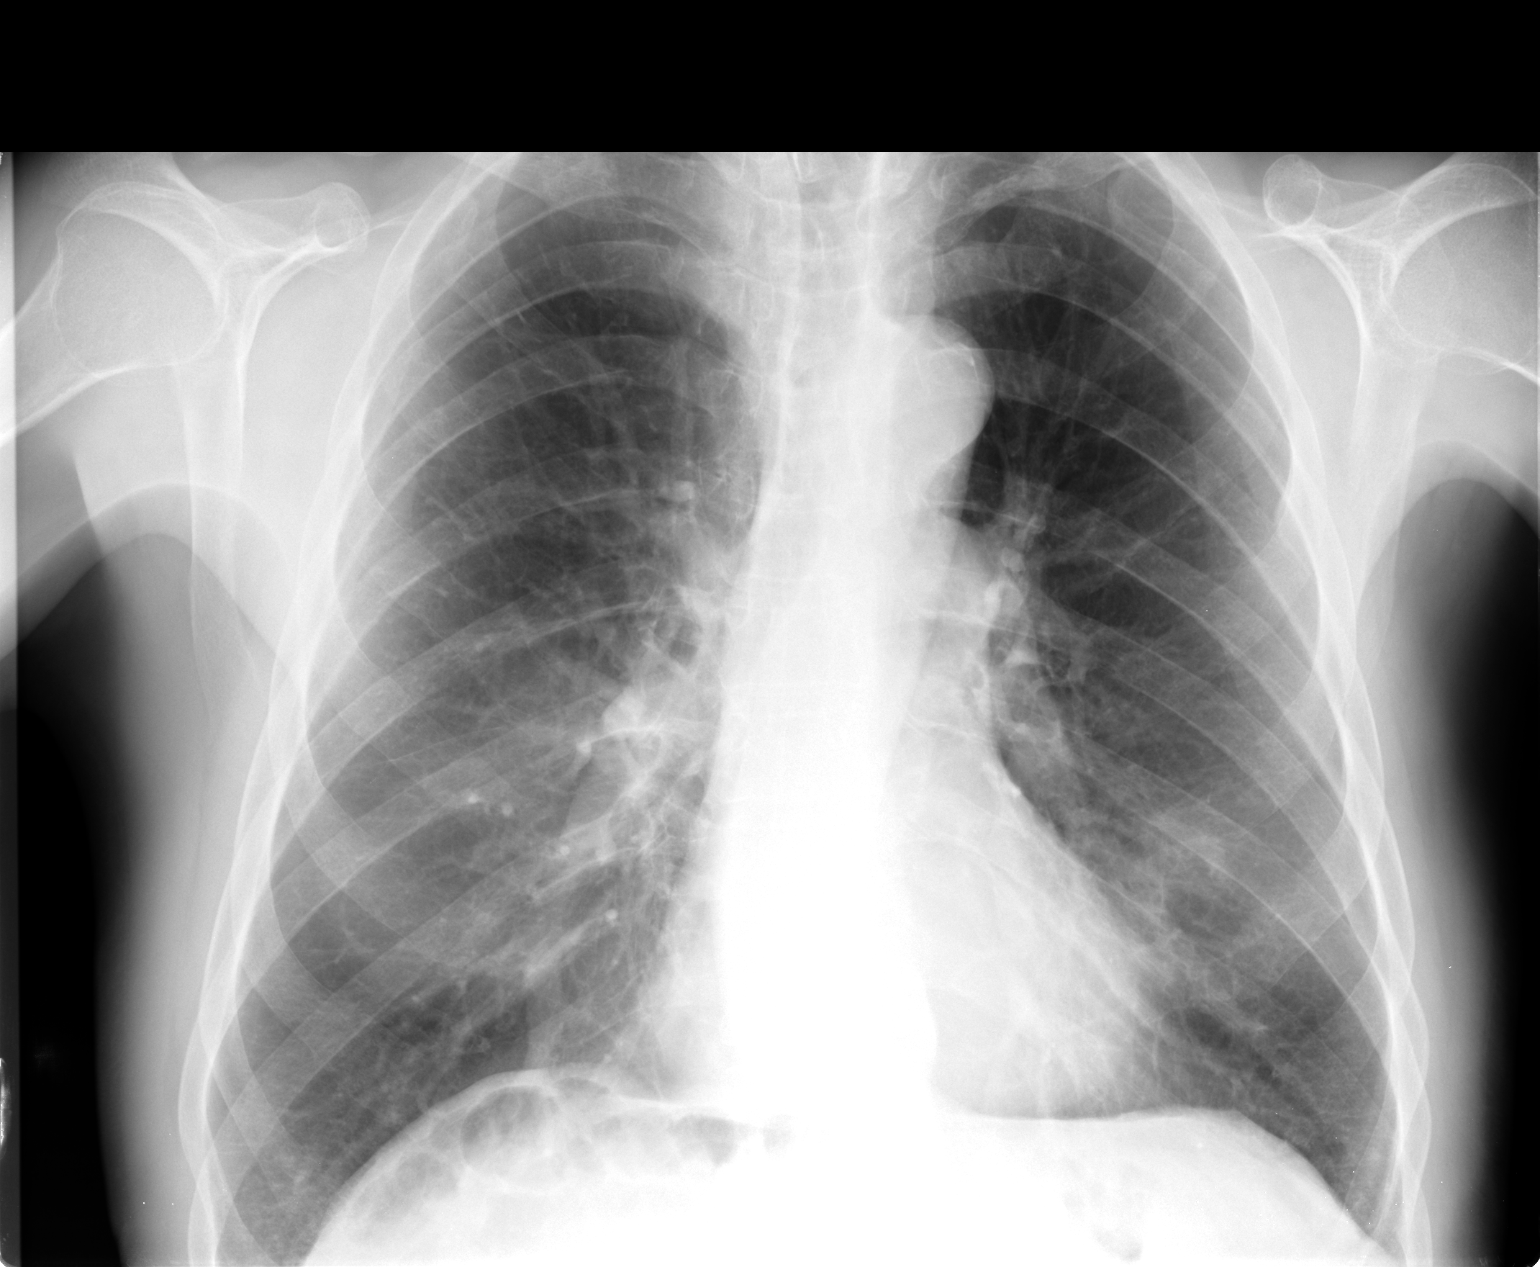

[view not recorded (2 of 3)]
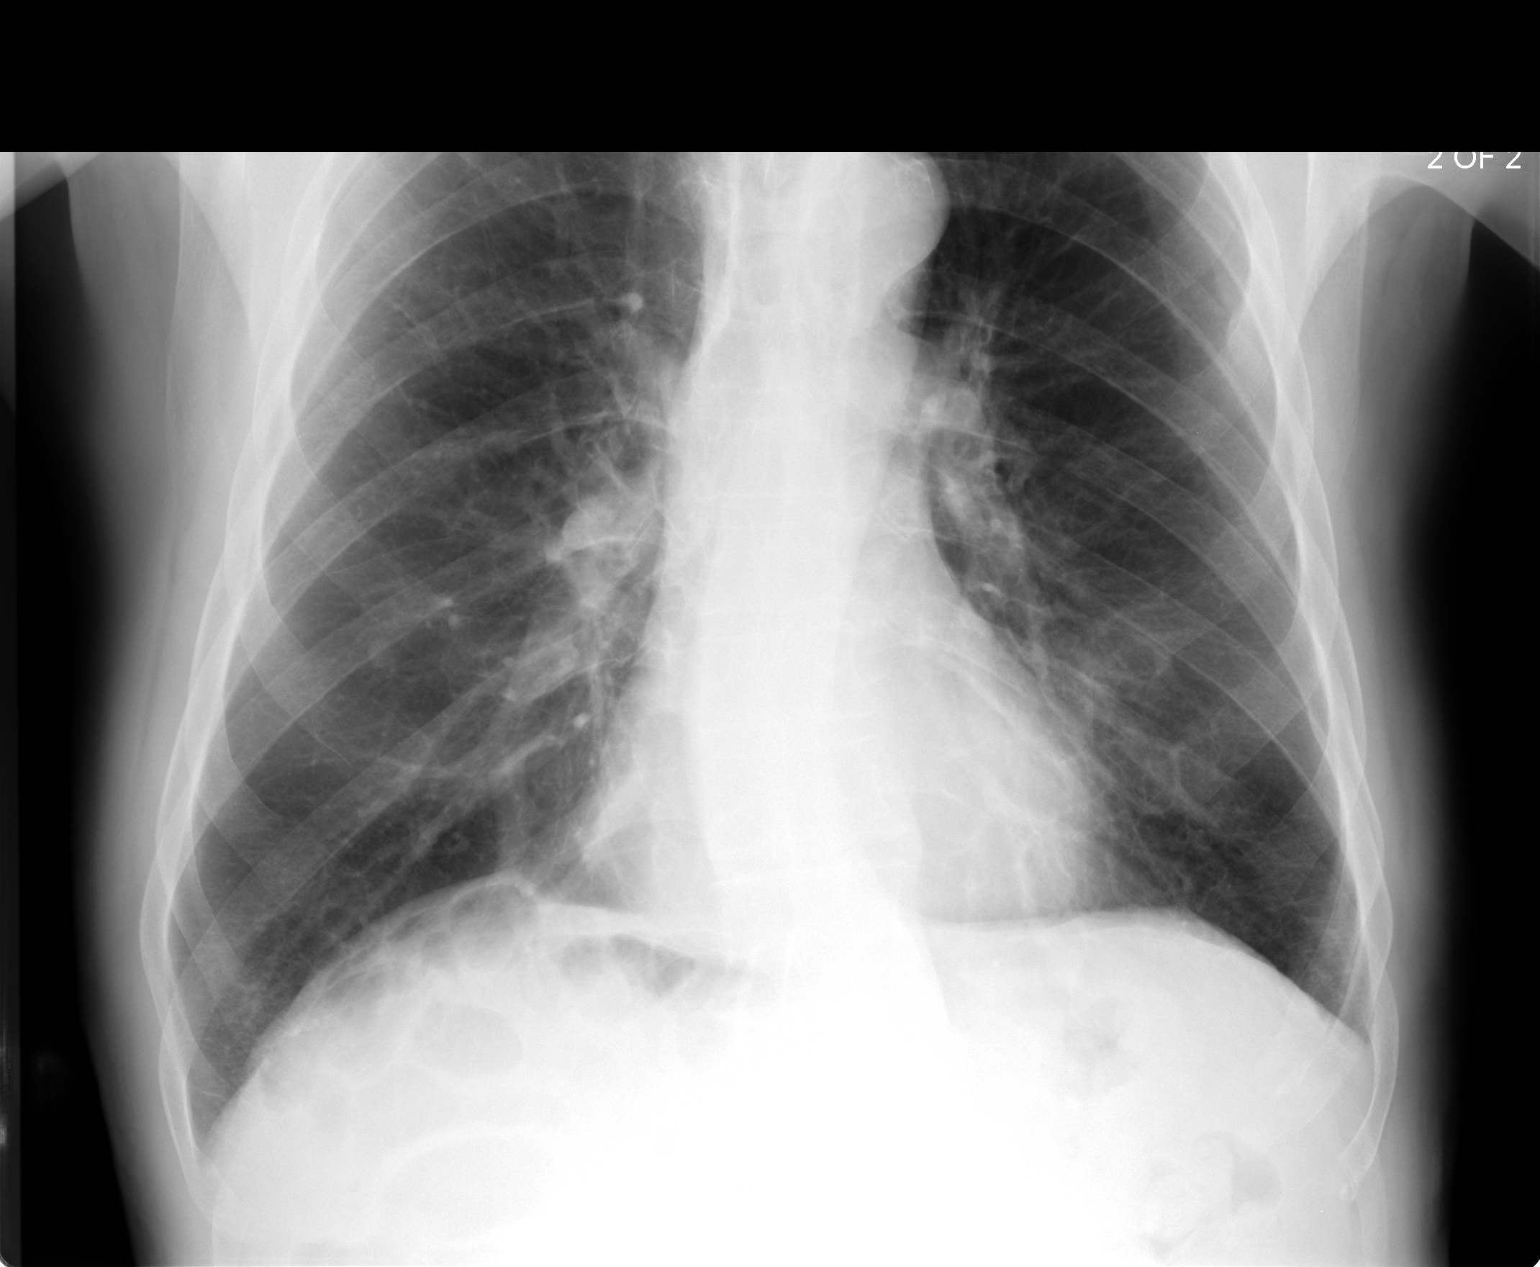

[view not recorded (3 of 3)]
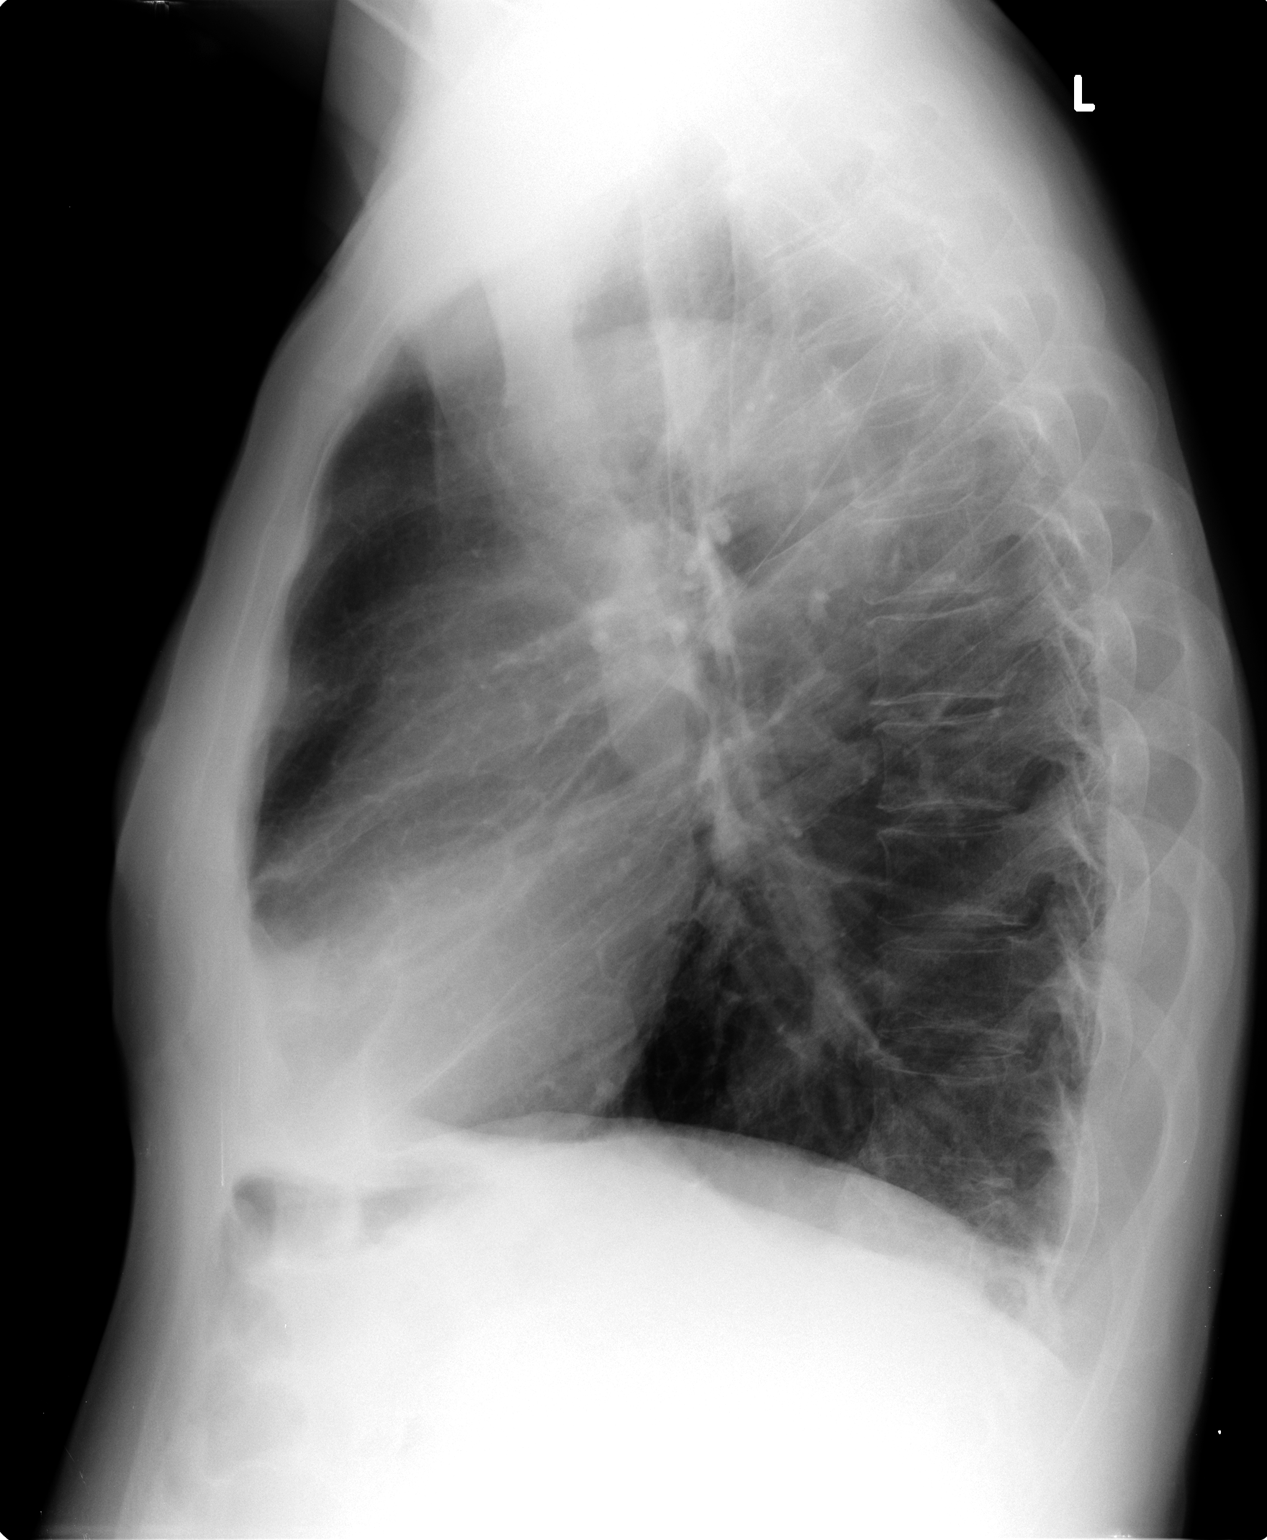

[3 of 3 positions shown; findings below may reference images not displayed]

FINDINGS: Lungs are hyperinflated.  There is perihilar bronchitic
change.  There are no focal consolidations or pleural effusions.
There is mild scoliosis of the thoracic spine.
IMPRESSION: 1.  COPD and bronchitic changes.
2. No evidence for acute cardiopulmonary abnormality.

## 2011-07-18 ENCOUNTER — Encounter: Payer: Self-pay | Admitting: Cardiovascular Disease

## 2011-09-05 ENCOUNTER — Other Ambulatory Visit: Payer: Self-pay | Admitting: Diagnostic Neuroimaging

## 2011-09-05 DIAGNOSIS — R531 Weakness: Secondary | ICD-10-CM

## 2011-09-11 ENCOUNTER — Ambulatory Visit
Admission: RE | Admit: 2011-09-11 | Discharge: 2011-09-11 | Disposition: A | Discharge status: Home / Self Care | Payer: Medicare Other | Source: Ambulatory Visit | Attending: Diagnostic Neuroimaging | Admitting: Diagnostic Neuroimaging

## 2011-09-11 DIAGNOSIS — R531 Weakness: Secondary | ICD-10-CM

## 2011-09-11 MED ORDER — GADOBENATE DIMEGLUMINE 529 MG/ML IV SOLN
17.0000 mL | Freq: Once | INTRAVENOUS | Status: AC | PRN
  Administered 2011-09-11: 17 mL via INTRAVENOUS

## 2012-01-10 ENCOUNTER — Telehealth (INDEPENDENT_AMBULATORY_CARE_PROVIDER_SITE_OTHER): Payer: Self-pay | Admitting: General Surgery

## 2012-01-10 NOTE — Telephone Encounter (Signed)
Avantae Bither called in from [phone] stating she was the patient's daughter-in-law. Looked up demographic information and she is not listed as a contact. Advised one of his sons needs to contact our office to discuss his care.

## 2012-01-10 NOTE — Telephone Encounter (Signed)
Pt's son calling for advice; father has history of colon Ca and is pt of Dr. Luisa Hart.  He has been having diarrhea for almost 4 weeks; seen today by Dr. Edison Simon PA in [place].  Instructed son to contact Dr. Regino Schultze with concerns and possible need for additional testing/ imaging to see if there is need to see a surgeon.  Son understands and will call PCP for direction.

## 2012-01-11 ENCOUNTER — Encounter: Payer: Self-pay | Admitting: Vascular Surgery

## 2012-01-18 ENCOUNTER — Encounter (INDEPENDENT_AMBULATORY_CARE_PROVIDER_SITE_OTHER): Payer: Self-pay | Admitting: *Deleted

## 2012-01-22 ENCOUNTER — Ambulatory Visit (INDEPENDENT_AMBULATORY_CARE_PROVIDER_SITE_OTHER): Payer: Medicare Other | Admitting: Internal Medicine

## 2012-01-22 ENCOUNTER — Encounter (INDEPENDENT_AMBULATORY_CARE_PROVIDER_SITE_OTHER): Payer: Self-pay | Admitting: Internal Medicine

## 2012-01-22 VITALS — BP 144/72 | HR 56 | Temp 98.5°F | Ht 71.0 in | Wt 169.7 lb

## 2012-01-22 DIAGNOSIS — R197 Diarrhea, unspecified: Secondary | ICD-10-CM

## 2012-01-22 DIAGNOSIS — C189 Malignant neoplasm of colon, unspecified: Secondary | ICD-10-CM | POA: Insufficient documentation

## 2012-01-22 NOTE — Patient Instructions (Addendum)
Stool studies. Will discuss with Dr. Karilyn Cota about possible colonoscopy once stools studies are back.  Take one Imodium daily.

## 2012-01-22 NOTE — Progress Notes (Addendum)
Subjective:     Patient ID: Peter Harvey, male   DOB: 06/05/1940, 71 y.o.   MRN: [id number]  [facility]. For the last month and a half he has had diarrhea. 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 Underwent a rt hemicolectomy in August of 2012 by Dr. Luisa Hart.. Patient was very weak and chemo and radiation were not performed.  He had been taking Imodium prn which has helped. He will take the Imodium and skip a day without having a BM. He is having 7-8 watery stools a day. She occasionally sees blood. He has fecal incontinence.  There have been no recent antibiotics. Appetite has been okay. No weight loss.  No melena. Abdominal aortic aneurysm repair in 02/17/2011.  New medications: Benicar and Neurotnin   Hx of colon cancer 12/14/10: Biopsy Invasive well to moderately differentiated adenocarcinoma with extensive mucinous differentiation arising from high grade adenomatous dysplasia.  Tumor invades thru muscularis propria and into periocolonic soft tissue. Visceral peritoneum, negative for tumor.  22 lymph nodes negative for tumor.   Rt hemoicolectomy. Colonoscopy 10/13/2010:  Ulcerated non-obstructing large mass was found at the ileocecal valve/cecum. [employer]  Review of Systems see hpi Current Outpatient Prescriptions  Medication Sig Dispense Refill  . aspirin EC 81 MG tablet Take 81 mg by mouth daily.        [name] atenolol (TENORMIN) 50 MG tablet Take 50 mg by mouth daily.        [name] donepezil (ARICEPT) 10 MG tablet Take 10 mg by mouth at bedtime as needed.      . gabapentin (NEURONTIN) 100 MG capsule Take 100 mg by mouth 3 (three) times daily.      [name] glyBURIDE-metformin (GLUCOVANCE) 5-500 MG per tablet Take 1 tablet by mouth 2 (two) times daily.       [name] LANTUS 100 UNIT/ML injection Inject 15 Units into the skin at bedtime.       [name] levothyroxine (SYNTHROID, LEVOTHROID) 100 MCG tablet Take 100 mcg by mouth  daily.        [name] NOVOLOG 100 UNIT/ML injection Inject 3 Units into the skin 3 (three) times daily before meals.       [name] olmesartan (BENICAR) 20 MG tablet Take 20 mg by mouth daily.      . cephALEXin (KEFLEX) 500 MG capsule Take 500 mg by mouth 3 (three) times daily.        . famotidine (PEPCID) 20 MG tablet Take 20 mg by mouth at bedtime.       . polyethylene glycol (MIRALAX / GLYCOLAX) packet Take 17 g by mouth daily.        . quinapril (ACCUPRIL) 20 MG tablet Take 2 tablets (40 mg total) by mouth daily.  60 tablet  11  . Tamsulosin HCl (FLOMAX) 0.4 MG CAPS Take 0.4 mg by mouth at bedtime.        Past Medical History  Diagnosis Date  . HTN (hypertension)   . DM (diabetes mellitus)   . Hypothyroidism   . Hernia   . Dizziness   . AAA (abdominal aortic aneurysm)     Dr. Edilia Bo  . Hyperlipidemia   . Leg pain     with walking  . 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 CAD (coronary artery disease)     EF 57% by Myoview 4/12;  cath 5/12: dLM 50%, pLAD 50%, pCFX 50%, OM1  occluded with distal filling with L-L collats, mRCA 70-75%, dRCA 80%;  severe RCA needs PCI but treated medically due to need for colon surgery and plans to possilby perform PCI in future  . PAD (peripheral artery disease)     c/b dry gangrene of toes  . CHF (congestive heart failure)   . COPD (chronic obstructive pulmonary disease)    Past Surgical History  Procedure Date  . Hernia repair   . Appendectomy   . Cardiac catheterization   . Colon surgery 12/12/10    r/t colon CA  . Abdominal surgery   . Abdominal aortic aneurysm repair 02/17/11    Open AAA repair and Right Fem-Pop   History   Social History  . Marital Status: Divorced    Spouse Name: N/A    Number of Children: N/A  . Years of Education: N/A   Occupational History  . Not on file.   Social History Main Topics  . Smoking status: Former Smoker -- 50 years    Types: Cigarettes    Quit date: 12/11/2010  . Smokeless tobacco:  Never Used  . Alcohol Use: No  . Drug Use: No  . Sexually Active: Not on file   Other Topics Concern  . Not on file   Social History Narrative  . No narrative on file   Family Status  Relation Status Death Age  . Mother Deceased   . Brother Deceased   . Father Deceased   . Sister Deceased    No Known Allergies      Objective:   Physical Exam  Filed Vitals:   01/22/12 1030  BP: 144/72  Pulse: 56  Temp: 98.5 F (36.9 C)  Height: 5\' 11"  (1.803 m)  Weight: 169 lb 11.2 oz (76.975 kg)   Alert and oriented. Skin warm and dry. Oral mucosa is moist.   . Sclera anicteric, conjunctivae is pink. Thyroid not enlarged. No cervical lymphadenopathy. Lungs clear. Heart regular rate and rhythm.  Abdomen is soft. Bowel sounds are positive. No hepatomegaly. No abdominal masses felt. No tenderness. Multiple abdominal scars. No edema to lower extremities.      Assessment:    Diarrhea. An infectious process needs to be ruled out first.  If normal, I  Will discuss with Dr Karilyn Cota about a possible colonoscopy.  Patient is a high risk candidate    Plan:    Stool O and P, Culture, WBC, C difficile. Further recommendations once we have these back.  May need repeat CT abdomen/pelvis to rule out malignancy

## 2012-01-25 ENCOUNTER — Other Ambulatory Visit (INDEPENDENT_AMBULATORY_CARE_PROVIDER_SITE_OTHER): Payer: Self-pay | Admitting: Internal Medicine

## 2012-01-25 ENCOUNTER — Telehealth (INDEPENDENT_AMBULATORY_CARE_PROVIDER_SITE_OTHER): Payer: Self-pay | Admitting: Internal Medicine

## 2012-01-25 LAB — OVA AND PARASITE SCREEN: OP: NONE SEEN

## 2012-01-25 MED ORDER — METRONIDAZOLE 500 MG PO TABS: 500.0000 mg | ORAL_TABLET | Freq: Three times a day (TID) | ORAL | Status: AC

## 2012-01-25 NOTE — Telephone Encounter (Signed)
Will start on flagyl. OV in one month.     Peter Harvey, needs OV in one month.

## 2012-01-25 NOTE — Telephone Encounter (Signed)
Apt has been scheduled for 02/19/12 at 10:30 am with Dorene Ar, NP

## 2012-02-16 ENCOUNTER — Encounter (INDEPENDENT_AMBULATORY_CARE_PROVIDER_SITE_OTHER): Payer: Self-pay

## 2012-02-19 ENCOUNTER — Ambulatory Visit (INDEPENDENT_AMBULATORY_CARE_PROVIDER_SITE_OTHER): Payer: Medicare Other | Admitting: Internal Medicine

## 2012-02-19 ENCOUNTER — Encounter (HOSPITAL_COMMUNITY): Payer: Self-pay | Admitting: Pharmacy Technician

## 2012-02-19 ENCOUNTER — Encounter (INDEPENDENT_AMBULATORY_CARE_PROVIDER_SITE_OTHER): Payer: Self-pay | Admitting: Internal Medicine

## 2012-02-19 ENCOUNTER — Telehealth (INDEPENDENT_AMBULATORY_CARE_PROVIDER_SITE_OTHER): Payer: Self-pay | Admitting: *Deleted

## 2012-02-19 ENCOUNTER — Other Ambulatory Visit (INDEPENDENT_AMBULATORY_CARE_PROVIDER_SITE_OTHER): Payer: Self-pay | Admitting: *Deleted

## 2012-02-19 VITALS — BP 162/74 | HR 42 | Temp 97.8°F | Ht 71.0 in | Wt 170.2 lb

## 2012-02-19 DIAGNOSIS — C189 Malignant neoplasm of colon, unspecified: Secondary | ICD-10-CM

## 2012-02-19 DIAGNOSIS — Z85038 Personal history of other malignant neoplasm of large intestine: Secondary | ICD-10-CM

## 2012-02-19 DIAGNOSIS — C180 Malignant neoplasm of cecum: Secondary | ICD-10-CM

## 2012-02-19 MED ORDER — PEG-KCL-NACL-NASULF-NA ASC-C 100 G PO SOLR: 1.0000 | Freq: Once | ORAL | Status: DC

## 2012-02-19 NOTE — Telephone Encounter (Signed)
Patient needs movi prep 

## 2012-02-19 NOTE — Progress Notes (Signed)
Subjective:     Patient ID: Peter Harvey, male   DOB: March 19, 1941, 71 y.o.   MRN: [id number]  HPIHere today for f/u.  He was seen for diarrhea last month. He was taking the Imodium twice a day, and became constipated. He backed off the Imodium to once a day.  He is usually having 5-8 stools a day x 2 1/2 months. Treated last month with Flagyl for a positive Lactoferrin.   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 Underwent a rt hemicolectomy in August of 2012 by Dr. Luisa Hart.. Patient was very weak and chemo and radiation were not performed. He had been taking Imodium prn which has helped. He will take the Imodium and skip a day without having a BM.  Appetite is good. No weight loss. He has actually gained weight. He denies seeing blood or black stool.s Abdominal aortic aneurysm repair in 02/17/2011.  New medications: Benicar and Neurotnin  ocecal valve/cecum. S   Review of Systems see hpi Current Outpatient Prescriptions  Medication Sig Dispense Refill  . aspirin EC 81 MG tablet Take 81 mg by mouth daily.        [name] atenolol (TENORMIN) 50 MG tablet Take 50 mg by mouth daily.        [name] donepezil (ARICEPT) 10 MG tablet Take 10 mg by mouth at bedtime as needed.      . glyBURIDE-metformin (GLUCOVANCE) 5-500 MG per tablet Take 1 tablet by mouth 2 (two) times daily.       [name] LANTUS 100 UNIT/ML injection Inject 15 Units into the skin at bedtime.       [name] levothyroxine (SYNTHROID, LEVOTHROID) 100 MCG tablet Take 100 mcg by mouth daily.        [name] loperamide (IMODIUM) 2 MG capsule Take 2 mg by mouth 4 (four) times daily as needed.      [name] NOVOLOG 100 UNIT/ML injection Inject 3 Units into the skin daily.       [name] olmesartan (BENICAR) 20 MG tablet Take 20 mg by mouth 2 (two) times daily before a meal.       . polyethylene glycol (MIRALAX / GLYCOLAX) packet Take 17 g by mouth daily.         Past Medical History  Diagnosis Date  . HTN (hypertension)   . DM (diabetes  mellitus)   . Hypothyroidism   . Hernia   . Dizziness   . AAA (abdominal aortic aneurysm)     Dr. Edilia Bo  . Hyperlipidemia   . Leg pain     with walking  . 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 CAD (coronary artery disease)     EF 57% by Myoview 4/12;  cath 5/12: dLM 50%, pLAD 50%, pCFX 50%, OM1 occluded with distal filling with L-L collats, mRCA 70-75%, dRCA 80%;  severe RCA needs PCI but treated medically due to need for colon surgery and plans to possilby perform PCI in future  . PAD (peripheral artery disease)     c/b dry gangrene of toes  . CHF (congestive heart failure)   . COPD (chronic obstructive pulmonary disease)    Past Surgical History  Procedure Date  . Hernia repair   . Appendectomy   . Cardiac catheterization   . Colon surgery 12/12/10    r/t colon CA  . Abdominal surgery   . Abdominal aortic aneurysm repair 02/17/11    Open AAA repair and Right Fem-Pop  Family Status  Relation Status Death Age  . Mother Deceased   . Brother Deceased   . Father Deceased   . Sister Deceased    No Known Allergies      Objective:   Physical Exam Filed Vitals:   02/19/12 1029  BP: 162/74  Pulse: 42  Temp: 97.8 F (36.6 C)  Height: 5\' 11"  (1.803 m)  Weight: 170 lb 3.2 oz (77.202 kg)        Assessment:    Diarrhea. Hx of colon cancer with a rt hemicolectomy. Symptoms are basically the same. Continue the Imodium daily.  I discussed this case with Dr. Karilyn Cota. Will need follow up colonoscopy for surveillance.    Plan:    colonoscopy with Dr. Karilyn Cota

## 2012-02-19 NOTE — Patient Instructions (Addendum)
Colonoscopy

## 2012-02-23 ENCOUNTER — Encounter (INDEPENDENT_AMBULATORY_CARE_PROVIDER_SITE_OTHER): Payer: Self-pay

## 2012-03-14 ENCOUNTER — Ambulatory Visit (HOSPITAL_COMMUNITY)
Admission: RE | Admit: 2012-03-14 | Discharge: 2012-03-14 | Disposition: A | Discharge status: Home / Self Care | Payer: Medicare Other | Source: Ambulatory Visit | Attending: Internal Medicine | Admitting: Internal Medicine

## 2012-03-14 ENCOUNTER — Encounter (HOSPITAL_COMMUNITY)
Admission: RE | Disposition: A | Discharge status: Home / Self Care | Payer: Self-pay | Source: Ambulatory Visit | Attending: Internal Medicine

## 2012-03-14 ENCOUNTER — Encounter (HOSPITAL_COMMUNITY): Payer: Self-pay | Admitting: *Deleted

## 2012-03-14 DIAGNOSIS — R197 Diarrhea, unspecified: Secondary | ICD-10-CM | POA: Insufficient documentation

## 2012-03-14 DIAGNOSIS — J449 Chronic obstructive pulmonary disease, unspecified: Secondary | ICD-10-CM | POA: Insufficient documentation

## 2012-03-14 DIAGNOSIS — C180 Malignant neoplasm of cecum: Secondary | ICD-10-CM

## 2012-03-14 DIAGNOSIS — D128 Benign neoplasm of rectum: Secondary | ICD-10-CM

## 2012-03-14 DIAGNOSIS — J4489 Other specified chronic obstructive pulmonary disease: Secondary | ICD-10-CM | POA: Insufficient documentation

## 2012-03-14 DIAGNOSIS — I10 Essential (primary) hypertension: Secondary | ICD-10-CM | POA: Insufficient documentation

## 2012-03-14 DIAGNOSIS — Z85038 Personal history of other malignant neoplasm of large intestine: Secondary | ICD-10-CM | POA: Insufficient documentation

## 2012-03-14 DIAGNOSIS — K644 Residual hemorrhoidal skin tags: Secondary | ICD-10-CM | POA: Insufficient documentation

## 2012-03-14 DIAGNOSIS — D129 Benign neoplasm of anus and anal canal: Secondary | ICD-10-CM

## 2012-03-14 DIAGNOSIS — E119 Type 2 diabetes mellitus without complications: Secondary | ICD-10-CM | POA: Insufficient documentation

## 2012-03-14 HISTORY — PX: COLONOSCOPY: SHX5424

## 2012-03-14 SURGERY — COLONOSCOPY
Anesthesia: Moderate Sedation

## 2012-03-14 MED ORDER — MEPERIDINE HCL 50 MG/ML IJ SOLN
INTRAMUSCULAR | Status: DC | PRN
  Administered 2012-03-14 (×2): 25 mg via INTRAVENOUS

## 2012-03-14 MED ORDER — ENALAPRILAT 1.25 MG/ML IV SOLN
INTRAVENOUS | Status: AC
  Filled 2012-03-14: qty 2

## 2012-03-14 MED ORDER — MIDAZOLAM HCL 5 MG/5ML IJ SOLN
INTRAMUSCULAR | Status: AC
  Filled 2012-03-14: qty 10

## 2012-03-14 MED ORDER — STERILE WATER FOR IRRIGATION IR SOLN
Status: DC | PRN
  Administered 2012-03-14: 13:00:00

## 2012-03-14 MED ORDER — MIDAZOLAM HCL 5 MG/5ML IJ SOLN
INTRAMUSCULAR | Status: DC | PRN
  Administered 2012-03-14 (×2): 1 mg via INTRAVENOUS

## 2012-03-14 MED ORDER — SODIUM CHLORIDE 0.45 % IV SOLN
INTRAVENOUS | Status: DC
  Administered 2012-03-14: 13:00:00 via INTRAVENOUS

## 2012-03-14 MED ORDER — ENALAPRILAT 1.25 MG/ML IV SOLN
1.2500 mg | Freq: Once | INTRAVENOUS | Status: AC
  Administered 2012-03-14: 1.25 mg via INTRAVENOUS

## 2012-03-14 MED ORDER — MEPERIDINE HCL 50 MG/ML IJ SOLN
INTRAMUSCULAR | Status: AC
  Filled 2012-03-14: qty 1

## 2012-03-14 MED ORDER — ENALAPRILAT 1.25 MG/ML IV SOLN
INTRAVENOUS | Status: DC | PRN
  Administered 2012-03-14 (×2): 1.25 mg via INTRAVENOUS

## 2012-03-14 NOTE — Op Note (Signed)
COLONOSCOPY PROCEDURE REPORT  PATIENT:  Peter Harvey  MR#:  [id number] Birthdate:  Jan 08, 1941, 71 y.o., male Endoscopist:  Dr. Malissa Hippo, MD Referred By:  Dr. Kirk Ruths, MD Procedure Date: 03/14/2012  Procedure:   Colonoscopy  Indications:  Patient is 71 year old Caucasian male who is undergoing colonoscopy for diagnostic and surveillance purposes. He underwent right, colectomy in August 2012 for cecal carcinoma. He has had diarrhea ever since. Stool studies are negative. He has a good appetite and has not lost any weight.  Informed Consent:  The procedure and risks were reviewed with the patient and informed consent was obtained.  Medications:  Demerol 25 mg IV Versed 4 mg IV Enalapril 2.5 mg IV in divided dose.  Description of procedure:  After a digital rectal exam was performed, that colonoscope was advanced from the anus through the rectum and colon to the area ileocolonic anastomosis in the region of hepatic flexure. Short segment of terminal ileum was also examined. As scope was slowly and cautiously withdrawn, the mucosal surfaces were carefully surveyed utilizing scope tip to flexion to facilitate fold flattening as needed. The scope was pulled down into the rectum where a thorough exam including retroflexion was performed.  Findings:   Prep excellent. Wide open ileocolonic anastomosis. 5 mm polyp of there are cold biopsy from rectum. Small hemorrhoids below the dentate line.  Therapeutic/Diagnostic Maneuvers Performed:  See above  Complications:  None  Colonic  Withdrawal Time:  10 minutes  Impression:  Wide open ileocolonic anastomosis in the region of hepatic flexure. 5 mm polyp ablated via cold biopsy from rectum. Small external hemorrhoids.  Recommendations:  Standard instructions given. Will check CEA level today. Imodium OTC 1 mg by mouth every morning. Align 1 capsule by mouth daily. I will be contacting patient with results of biopsy and  blood work.  REHMAN,NAJEEB U  03/14/2012 1:47 PM  CC: Dr. Kirk Ruths, MD & Dr. Bonnetta Barry ref. provider found          Dr. Harriette Bouillon, MD

## 2012-03-14 NOTE — OR Nursing (Signed)
Dr. Karilyn Cota notified of increased BP. Orders received and carried out.

## 2012-03-14 NOTE — H&P (Addendum)
Peter Harvey is an 71 y.o. male.   Chief Complaint: Patient is here for colonoscopy. HPI: Patient is 71 year old Caucasian male with history of colon carcinoma. Had right, colectomy for cecal CA in August, 2012. She's been experiencing diarrhea ever since. He has had multiple accidents. He is using depends. He has good appetite and he denies abdominal pain or rectal bleeding. He has taken Imodium but he gets constipated. Stool studies were negative. He has severe peripheral vascular disease and uses wheelchair at home. He denies exertional dyspnea.   Past Medical History  Diagnosis Date  . HTN (hypertension)   . DM (diabetes mellitus)   . Hypothyroidism   . Hernia   . Dizziness   . AAA (abdominal aortic aneurysm)     Dr. Edilia Bo  . Hyperlipidemia   . Leg pain     with walking  . 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 CAD (coronary artery disease)     EF 57% by Myoview 4/12;  cath 5/12: dLM 50%, pLAD 50%, pCFX 50%, OM1 occluded with distal filling with L-L collats, mRCA 70-75%, dRCA 80%;  severe RCA needs PCI but treated medically due to need for colon surgery and plans to possilby perform PCI in future  . PAD (peripheral artery disease)     c/b dry gangrene of toes  . CHF (congestive heart failure)   . COPD (chronic obstructive pulmonary disease)     Past Surgical History  Procedure Date  . Hernia repair   . Appendectomy   . Cardiac catheterization   . Colon surgery 12/12/10    r/t colon CA  . Abdominal surgery   . Abdominal aortic aneurysm repair 02/17/11    Open AAA repair and Right Fem-Pop    Family History  Problem Relation Age of Onset  . Diabetes Mother   . Heart disease Mother     heart failure  . Heart disease Brother   . Other Brother 72    MRSA  . COPD Father   . Heart disease Father     heart failure  . COPD Sister    Social History:  reports that he quit smoking about 15 months ago. His smoking use included Cigarettes. He has  a 100 pack-year smoking history. He has never used smokeless tobacco. He reports that he does not drink alcohol or use illicit drugs.  Allergies: No Known Allergies  Medications Prior to Admission  Medication Sig Dispense Refill  . aspirin EC 81 MG tablet Take 81 mg by mouth daily.        [name] atenolol (TENORMIN) 50 MG tablet Take 50 mg by mouth daily.        [name] donepezil (ARICEPT) 10 MG tablet Take 10 mg by mouth at bedtime.       [name] glyBURIDE-metformin (GLUCOVANCE) 5-500 MG per tablet Take 1 tablet by mouth 2 (two) times daily.       [name] LANTUS 100 UNIT/ML injection Inject 15 Units into the skin at bedtime.       [name] levothyroxine (SYNTHROID, LEVOTHROID) 100 MCG tablet Take 100 mcg by mouth daily.        [name] loperamide (IMODIUM) 2 MG capsule Take 2 mg by mouth 4 (four) times daily as needed. Diarrhea.      [name] NOVOLOG 100 UNIT/ML injection Inject 3 Units into the skin daily.       [name] olmesartan (BENICAR) 20 MG tablet Take 20 mg by mouth 2 (two)  times daily before a meal.       . peg 3350 powder (MOVIPREP) 100 G SOLR Take 1 kit (100 g total) by mouth once.  1 kit  0    No results found for this or any previous visit (from the past 48 hour(s)). No results found.  ROS  Blood pressure 204/102, pulse 62, temperature 98.5 F (36.9 C), temperature source Oral, resp. rate 19, height 5\' 11"  (1.803 m), weight 171 lb (77.565 kg), SpO2 93.00%. Physical Exam  Constitutional: He appears well-developed and well-nourished.  HENT:  Mouth/Throat: Oropharynx is clear and moist.  Eyes: Conjunctivae normal are normal. No scleral icterus.  Neck: No thyromegaly present.  Cardiovascular: Normal rate, regular rhythm and normal heart sounds.   No murmur heard. Respiratory: Effort normal.       Few crackles at left base  GI: Soft. He exhibits no distension. There is no tenderness.  Musculoskeletal: He exhibits no edema.  Lymphadenopathy:    He has no cervical adenopathy.  Neurological: He is alert.  Skin: Skin is  warm and dry.     Assessment/Plan History of colon carcinoma status post right, colectomy August ,2012. Chronic diarrhea since surgery with negative stool studies. Colonoscopy for diagnostic and surveillance purposes. We'll also check CEA level today.  REHMAN,NAJEEB U 03/14/2012, 1:07 PM

## 2012-03-19 ENCOUNTER — Encounter (HOSPITAL_COMMUNITY): Payer: Self-pay | Admitting: Internal Medicine

## 2012-03-19 LAB — GLUCOSE, CAPILLARY

## 2012-03-29 ENCOUNTER — Encounter (INDEPENDENT_AMBULATORY_CARE_PROVIDER_SITE_OTHER): Payer: Self-pay | Admitting: *Deleted

## 2012-04-02 ENCOUNTER — Telehealth (INDEPENDENT_AMBULATORY_CARE_PROVIDER_SITE_OTHER): Payer: Self-pay | Admitting: *Deleted

## 2012-04-02 DIAGNOSIS — C189 Malignant neoplasm of colon, unspecified: Secondary | ICD-10-CM

## 2012-04-02 NOTE — Telephone Encounter (Signed)
Per Dr.Rehman the patient will need to have CEA in 6 months

## 2012-05-21 ENCOUNTER — Ambulatory Visit (INDEPENDENT_AMBULATORY_CARE_PROVIDER_SITE_OTHER): Payer: Medicare Other | Admitting: Internal Medicine

## 2012-05-21 ENCOUNTER — Encounter (INDEPENDENT_AMBULATORY_CARE_PROVIDER_SITE_OTHER): Payer: Self-pay | Admitting: Internal Medicine

## 2012-05-21 VITALS — BP 120/86 | HR 68 | Temp 98.2°F | Resp 16 | Ht 71.0 in | Wt 164.4 lb

## 2012-05-21 DIAGNOSIS — R197 Diarrhea, unspecified: Secondary | ICD-10-CM

## 2012-05-21 MED ORDER — DIPHENOXYLATE-ATROPINE 2.5-0.025 MG PO TABS: 1.0000 | ORAL_TABLET | Freq: Three times a day (TID) | ORAL | Status: DC

## 2012-05-21 MED ORDER — CHOLESTYRAMINE 4 G PO PACK: 1.0000 | PACK | Freq: Two times a day (BID) | ORAL | Status: DC

## 2012-05-21 NOTE — Progress Notes (Signed)
Presenting complaint;  Persistent diarrhea.  Subjective:  Patient is 72 year old Caucasian male who has history of colon carcinoma and underwent right hemicolectomy in August 2012. He underwent colonoscopy on 03/14/2012 revealing wide open ileocolonic anastomosis in the region of hepatic flexure. In 5 mm polyp removed via cold biopsy from rectum and it was a tubular adenoma. His CEA was 4.1. Patient was advised to take probiotic 1 capsule daily and loperamide daily. Is accompanied by his daughter Bethann Berkshire who looks after him. Patient continues with diarrhea. He has anywhere from 4-10 bowel movements per day. Most of her stools are loose and watery occasionally he passes bits and pieces of solid green and he has lot of flatulence. His appetite is good. He has lost 6 pounds since his last visit. He was also treated with metronidazole for 10 days but did not feel any better. He is still having accidents during daytime and rarely has a bowel movement at night.   Current Medications: Current Outpatient Prescriptions  Medication Sig Dispense Refill  . aspirin EC 81 MG tablet Take 81 mg by mouth daily.        [name] atenolol (TENORMIN) 50 MG tablet Take 50 mg by mouth daily.        [name] donepezil (ARICEPT) 10 MG tablet Take 10 mg by mouth at bedtime.       [name] glyBURIDE-metformin (GLUCOVANCE) 5-500 MG per tablet Take 1 tablet by mouth 2 (two) times daily.       [name] LANTUS 100 UNIT/ML injection Inject 15 Units into the skin at bedtime.       [name] levothyroxine (SYNTHROID, LEVOTHROID) 100 MCG tablet Take 100 mcg by mouth daily.        [name] loperamide (IMODIUM) 2 MG capsule Take 2 mg by mouth 4 (four) times daily as needed. Diarrhea.      [name] NOVOLOG 100 UNIT/ML injection Inject 3 Units into the skin daily.       [name] olmesartan (BENICAR) 20 MG tablet Take 20 mg by mouth 2 (two) times daily before a meal.       . Probiotic Product (ALIGN) 4 MG CAPS Take by mouth daily.         Objective: Blood pressure 120/86, pulse 68,  temperature 98.2 F (36.8 C), temperature source Oral, resp. rate 16, height 5\' 11"  (1.803 m), weight 164 lb 6.4 oz (74.571 kg). Patient appears to be comfortable sitting in a wheelchair. Conjunctiva is pink. Sclera is nonicteric Oropharyngeal mucosa is normal. No neck masses or thyromegaly noted. Abdomen was examined in sitting position. Bowel sounds are normal. On palpation is soft and nontender without organomegaly or masses. No LE edema or clubbing noted.   Assessment:   Chronic diarrhea in a patient with right hemi-colectomy for carcinoma cecal carcinoma in August 2012. Recent colonoscopy of November 2013 negative for colitis. He has not responded to empiric antibiotic therapy as well as probiotic and loperamide. Possible etiologies include diabetic diarrhea was secondary to irritant effect of bile on colon. Plan:   Discontinue Imodium or loperamide. Diphenoxylate one tablet by mouth 3 times a day. If diarrhea persists start cholestyramine 4 g by mouth twice a day. Each dose to be taken 2 hours before or after taking other medications. Stool diary for 4 weeks. Call with progress report in 4 weeks. Office visit in three-months,

## 2012-05-21 NOTE — Patient Instructions (Signed)
Can drop diphenoxylate dose to one twice daily if he becomes constipated with 3 pills a day. If diphenoxylate 3 times a day does not slow his diarrhea down start cholestyramine Questran. Keep stool diary and call with progress report in 4 weeks

## 2012-06-04 ENCOUNTER — Telehealth (INDEPENDENT_AMBULATORY_CARE_PROVIDER_SITE_OTHER): Payer: Self-pay | Admitting: *Deleted

## 2012-06-04 NOTE — Telephone Encounter (Signed)
Lomotil not working for diarrhea, should he go to [place] or finish lomotil, please advise --[phone]

## 2012-06-04 NOTE — Telephone Encounter (Signed)
Patient's line is busy. Per Dr.Rehman the patient may start the Questran as he was directed. He may also take both the Questran and the Lomotil. Patient will be made aware.

## 2012-07-04 ENCOUNTER — Telehealth (INDEPENDENT_AMBULATORY_CARE_PROVIDER_SITE_OTHER): Payer: Self-pay | Admitting: *Deleted

## 2012-07-04 NOTE — Telephone Encounter (Signed)
These are the instructions per Dr.Rehman at the time of Office visit with Dr.Rehman : Discontinue Imodium or loperamide.  Diphenoxylate one tablet by mouth 3 times a day.  If diarrhea persists start cholestyramine 4 g by mouth twice a day. Each dose to be taken 2 hours before or after taking other medications.  Stool diary for 4 weeks.  Call with progress report in 4 weeks.  Office visit in three-months,   I returned the call to Elease Hashimoto and she states that she was giving him the Questran twice a day as directed and he seemed to make him have the diarrhea more, and his BM's were orange in color and foamy. She also says that the Questran was orange in color. The patient has refused to take this medication as he told her that it was causing things to be worse. She has been giving him 1 Imodium by mouth in the morning and at night and this has seemed to help. His children are wanting to make sure if there is something else that might could be done for the diarrhea. Call back number is [phone].

## 2012-07-04 NOTE — Telephone Encounter (Signed)
Elease Hashimoto LM stating Peter Harvey's diarrhea was just as bad while taking the Questran. [name] has dementia and thought the medicine was causing the diarrhea so he just stopped taking it. She tried Imodium morning and night which seemed to help. The sons would like to know is there anything else Dr. Karilyn Cota would suggest. The return phone number is [phone].

## 2012-07-05 NOTE — Telephone Encounter (Signed)
Already addressed; please see Ms. Todds,

## 2012-07-08 ENCOUNTER — Encounter: Payer: Self-pay | Admitting: *Deleted

## 2012-07-11 ENCOUNTER — Encounter: Payer: Self-pay | Admitting: Cardiovascular Disease

## 2012-07-11 ENCOUNTER — Ambulatory Visit (INDEPENDENT_AMBULATORY_CARE_PROVIDER_SITE_OTHER): Payer: Medicare Other | Admitting: Cardiovascular Disease

## 2012-07-11 VITALS — BP 138/72 | HR 70 | Ht 69.0 in | Wt 151.2 lb

## 2012-07-11 DIAGNOSIS — I2510 Atherosclerotic heart disease of native coronary artery without angina pectoris: Secondary | ICD-10-CM

## 2012-07-11 DIAGNOSIS — I714 Abdominal aortic aneurysm, without rupture: Secondary | ICD-10-CM

## 2012-07-11 DIAGNOSIS — I739 Peripheral vascular disease, unspecified: Secondary | ICD-10-CM | POA: Insufficient documentation

## 2012-07-11 MED ORDER — NITROGLYCERIN 0.4 MG SL SUBL: 0.4000 mg | SUBLINGUAL_TABLET | SUBLINGUAL | Status: DC | PRN

## 2012-07-11 NOTE — Assessment & Plan Note (Signed)
Well controlled.  Continue current medications and low sodium Dash type diet.    

## 2012-07-11 NOTE — Progress Notes (Signed)
Patient ID: Peter Harvey, male   DOB: 1940/09/28, 72 y.o.   MRN: [id number] Peter Harvey is a 72 y.o. male with a complex history. He has CAD with cath 5/12: dLM 50%, pLAD 50%, pCFX 50%, OM1 occluded with distal filling with L-L collats, mRCA 70-75%, dRCA 80%. He also has severe PAD, followed by Dr. Edilia Bo. 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 Subsequently had AAA resection and aortobifem by Dr Durwin Nora 10/13.  Had to go to SNF and continues to be mostly wheel chair bound with dementia.  Ex daughter in law lives with him and seems attentive. Some dyspnea and anxiety.  No real chest pain.  Legs less painful since aorto bifem  Has stopped smoking 6 months ago.  Amlodipne recently added for HTN  ROS: Denies fever, malais, weight loss, blurry vision, decreased visual acuity, cough, sputum, SOB, hemoptysis, pleuritic pain, palpitaitons, heartburn, abdominal pain, melena, lower extremity edema, claudication, or rash.  All other systems reviewed and negative  General: Affect appropriate Chronically ill male HEENT: normal Neck supple with no adenopathy JVP normalbilateral  bruits no thyromegaly Lungs clear with no wheezing and good diaphragmatic motion Heart:  S1/S2 no murmur, no rub, gallop or click PMI normal Abdomen: benighn, BS positve, no tenderness, AAA repair. Improved DP pulses bilaterally    No HSM or HJR Distal pulses intact with no bruits No edema Neuro non-focal Skin discolored in arms and legs and cool No muscular weakness   Current Outpatient Prescriptions  Medication Sig Dispense Refill  . amLODipine (NORVASC) 5 MG tablet Take 5 mg by mouth daily.      [name] aspirin EC 81 MG tablet Take 81 mg by mouth daily.        [name] atenolol (TENORMIN) 50 MG tablet Take 50 mg by mouth daily.        . cholestyramine (QUESTRAN) 4 G packet Take 1 packet by mouth 2 (two) times daily.  60 each  2   . diphenoxylate-atropine (LOMOTIL) 2.5-0.025 MG per tablet Take 1 tablet by mouth 3 (three) times daily before meals.  90 tablet  2  . donepezil (ARICEPT) 10 MG tablet Take 10 mg by mouth at bedtime.       [name] glyBURIDE-metformin (GLUCOVANCE) 5-500 MG per tablet Take 1 tablet by mouth 2 (two) times daily.       [name] LANTUS 100 UNIT/ML injection Inject 15 Units into the skin at bedtime.       [name] levothyroxine (SYNTHROID, LEVOTHROID) 150 MCG tablet Take 150 mcg by mouth daily.      [name] loperamide (IMODIUM) 2 MG capsule Take 2 mg by mouth 4 (four) times daily as needed for diarrhea or loose stools.      [name] NOVOLOG 100 UNIT/ML injection Inject 3 Units into the skin daily.       [name] olmesartan (BENICAR) 20 MG tablet Take 20 mg by mouth 2 (two) times daily before a meal.       . Probiotic Product (ALIGN) 4 MG CAPS Take by mouth daily.       No current facility-administered medications for this visit.    Allergies  Review of patient's allergies indicates no known allergies.  Electrocardiogram:  SR rate 70 PAC nonspecific ST/T wave changes no differnent than 10/13   Assessment and Plan

## 2012-07-11 NOTE — Assessment & Plan Note (Signed)
Stable with no angina and good activity level.  Continue medical Rx Nitro called into [facility]

## 2012-07-11 NOTE — Assessment & Plan Note (Signed)
S/P repair 10/13 by Dr Durwin Nora.  Doing well

## 2012-07-11 NOTE — Patient Instructions (Addendum)
Your physician recommends that you schedule a follow-up appointment in: 6 MONTHS  The proper use and anticipated side effects of nitroglycerine has been carefully explained.  If a single episode of chest pain is not relieved by one tablet, the patient will try another within 5 minutes; and if this doesn't relieve the pain, the patient is instructed to call 911 for transportation to an emergency department.

## 2012-07-11 NOTE — Assessment & Plan Note (Signed)
F/U ABI";s Dr Durwin Nora Only lost one toe on right foot Pulses improved

## 2012-08-09 ENCOUNTER — Encounter (INDEPENDENT_AMBULATORY_CARE_PROVIDER_SITE_OTHER): Payer: Self-pay | Admitting: *Deleted

## 2012-08-19 ENCOUNTER — Ambulatory Visit (INDEPENDENT_AMBULATORY_CARE_PROVIDER_SITE_OTHER): Payer: Medicare Other | Admitting: Internal Medicine

## 2012-08-19 ENCOUNTER — Encounter (INDEPENDENT_AMBULATORY_CARE_PROVIDER_SITE_OTHER): Payer: Self-pay | Admitting: Internal Medicine

## 2012-08-19 VITALS — BP 128/86 | HR 52 | Ht 69.5 in | Wt 158.0 lb

## 2012-08-19 DIAGNOSIS — R197 Diarrhea, unspecified: Secondary | ICD-10-CM

## 2012-08-19 NOTE — Patient Instructions (Addendum)
OV in 6 months. Continue present medication

## 2012-08-19 NOTE — Progress Notes (Signed)
Subjective:     Patient ID: Peter Harvey, male   DOB: 1940-06-15, 72 y.o.   MRN: [id number]  HPI  Here today for f/u of his diarrhea. The Cholestyramine did not help. The cholestyramine actually made his diarrhea worse. He is now having 2-3 stools a day. In January he was having anywhere from 4-10 stools a day.  At night, his caregiver gives him 2 Imodium. She tells me his BMs are under control. His stools are sometimes formed. Appetite is good. He has lost 6 pounds since his last office visit. No melena or bright  03/14/2012 Colonoscopy for hx of cecal carcinoma:Colonic Withdrawal Time: 10 minutes  Impression:  Wide open ileocolonic anastomosis in the region of hepatic flexure.  5 mm polyp ablated via cold biopsy from rectum.  Small external hemorrhoids.  Review of Systems see hpi Current Outpatient Prescriptions  Medication Sig Dispense Refill  . amLODipine (NORVASC) 5 MG tablet Take 5 mg by mouth daily.      [name] aspirin EC 81 MG tablet Take 81 mg by mouth daily.        [name] atenolol (TENORMIN) 50 MG tablet Take 50 mg by mouth daily.        [name] donepezil (ARICEPT) 10 MG tablet Take 10 mg by mouth at bedtime.       [name] glipiZIDE-metformin (METAGLIP) 5-500 MG per tablet       . LANTUS 100 UNIT/ML injection Inject 25 Units into the skin at bedtime.       [name] levothyroxine (SYNTHROID, LEVOTHROID) 150 MCG tablet Take 150 mcg by mouth daily.      [name] loperamide (IMODIUM) 2 MG capsule Take 2 mg by mouth 4 (four) times daily as needed for diarrhea or loose stools.      . nitroGLYCERIN (NITROSTAT) 0.4 MG SL tablet Place 1 tablet (0.4 mg total) under the tongue every 5 (five) minutes as needed for chest pain.  25 tablet  3  . NOVOLOG 100 UNIT/ML injection Inject 3 Units into the skin daily.       [name] olmesartan (BENICAR) 20 MG tablet Take 20 mg by mouth 2 (two) times daily before a meal.       . pravastatin (PRAVACHOL) 40 MG tablet       . Probiotic Product (ALIGN) 4 MG CAPS Take by mouth daily.      .  cholestyramine light (PREVALITE) 4 G packet        No current facility-administered medications for this visit.   Past Medical History  Diagnosis Date  . HTN (hypertension)   . DM (diabetes mellitus)   . Hypothyroidism   . Hernia   . Dizziness   . AAA (abdominal aortic aneurysm)     Dr. Edilia Bo  . Hyperlipidemia   . Leg pain     with walking  . 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 CAD (coronary artery disease)     EF 57% by Myoview 4/12;  cath 5/12: dLM 50%, pLAD 50%, pCFX 50%, OM1 occluded with distal filling with L-L collats, mRCA 70-75%, dRCA 80%;  severe RCA needs PCI but treated medically due to need for colon surgery and plans to possilby perform PCI in future  . PAD (peripheral artery disease)     c/b dry gangrene of toes  . CHF (congestive heart failure)   . COPD (chronic obstructive pulmonary disease)    Past Surgical History  Procedure Laterality Date  . Hernia repair    .  Appendectomy    . Cardiac catheterization    . Colon surgery  12/12/10    r/t colon CA  . Abdominal surgery    . Abdominal aortic aneurysm repair  02/17/11    Open AAA repair and Right Fem-Pop  . Colonoscopy  03/14/2012    Procedure: COLONOSCOPY;  Surgeon: Malissa Hippo, MD;  Location: [facility];  Service: Endoscopy;  Laterality: N/A;  345-Ann to notify pt of new time   No Known Allergies      Objective:   Physical Exam  Filed Vitals:   08/19/12 1020  BP: 128/86  Pulse: 52  Height: 5' 9.5" (1.765 m)  Weight: 158 lb (71.668 kg)   Alert and oriented. Skin warm and dry. Oral mucosa is moist.   . Sclera anicteric, conjunctivae is pink. Thyroid not enlarged. No cervical lymphadenopathy. Lungs clear. Heart regular rate and rhythm.  Abdomen is soft. Bowel sounds are positive. No hepatomegaly. No abdominal masses felt. No tenderness.  No edema to lower extremities.      Assessment:    Diarrhea: controlled at this time with 2 Imodium at night. He also is taking the  Align    Plan:     OV in 6 months.

## 2012-08-22 ENCOUNTER — Encounter (INDEPENDENT_AMBULATORY_CARE_PROVIDER_SITE_OTHER): Payer: Self-pay | Admitting: *Deleted

## 2012-08-22 ENCOUNTER — Other Ambulatory Visit (INDEPENDENT_AMBULATORY_CARE_PROVIDER_SITE_OTHER): Payer: Self-pay | Admitting: *Deleted

## 2012-08-22 DIAGNOSIS — C189 Malignant neoplasm of colon, unspecified: Secondary | ICD-10-CM

## 2012-12-01 ENCOUNTER — Emergency Department (HOSPITAL_COMMUNITY): Payer: Medicare Other

## 2012-12-01 ENCOUNTER — Encounter (HOSPITAL_COMMUNITY): Payer: Self-pay | Admitting: *Deleted

## 2012-12-01 ENCOUNTER — Inpatient Hospital Stay (HOSPITAL_COMMUNITY)
Admission: EM | Admit: 2012-12-01 | Discharge: 2012-12-03 | DRG: 194 | Disposition: A | Discharge status: Home / Self Care | Payer: Medicare Other | Attending: Internal Medicine | Admitting: Internal Medicine

## 2012-12-01 DIAGNOSIS — E039 Hypothyroidism, unspecified: Secondary | ICD-10-CM | POA: Diagnosis present

## 2012-12-01 DIAGNOSIS — R197 Diarrhea, unspecified: Secondary | ICD-10-CM | POA: Diagnosis present

## 2012-12-01 DIAGNOSIS — R1310 Dysphagia, unspecified: Secondary | ICD-10-CM | POA: Diagnosis present

## 2012-12-01 DIAGNOSIS — F0390 Unspecified dementia without behavioral disturbance: Secondary | ICD-10-CM | POA: Diagnosis present

## 2012-12-01 DIAGNOSIS — I10 Essential (primary) hypertension: Secondary | ICD-10-CM | POA: Diagnosis present

## 2012-12-01 DIAGNOSIS — J189 Pneumonia, unspecified organism: Principal | ICD-10-CM | POA: Diagnosis present

## 2012-12-01 DIAGNOSIS — IMO0001 Reserved for inherently not codable concepts without codable children: Secondary | ICD-10-CM | POA: Diagnosis present

## 2012-12-01 DIAGNOSIS — R1319 Other dysphagia: Secondary | ICD-10-CM | POA: Diagnosis present

## 2012-12-01 DIAGNOSIS — I714 Abdominal aortic aneurysm, without rupture, unspecified: Secondary | ICD-10-CM | POA: Diagnosis present

## 2012-12-01 DIAGNOSIS — J4489 Other specified chronic obstructive pulmonary disease: Secondary | ICD-10-CM | POA: Diagnosis present

## 2012-12-01 DIAGNOSIS — Z9049 Acquired absence of other specified parts of digestive tract: Secondary | ICD-10-CM

## 2012-12-01 DIAGNOSIS — I2510 Atherosclerotic heart disease of native coronary artery without angina pectoris: Secondary | ICD-10-CM | POA: Diagnosis present

## 2012-12-01 DIAGNOSIS — R531 Weakness: Secondary | ICD-10-CM

## 2012-12-01 DIAGNOSIS — F05 Delirium due to known physiological condition: Secondary | ICD-10-CM | POA: Diagnosis present

## 2012-12-01 DIAGNOSIS — J449 Chronic obstructive pulmonary disease, unspecified: Secondary | ICD-10-CM | POA: Diagnosis present

## 2012-12-01 DIAGNOSIS — E785 Hyperlipidemia, unspecified: Secondary | ICD-10-CM | POA: Diagnosis present

## 2012-12-01 DIAGNOSIS — Z993 Dependence on wheelchair: Secondary | ICD-10-CM

## 2012-12-01 DIAGNOSIS — Z85038 Personal history of other malignant neoplasm of large intestine: Secondary | ICD-10-CM

## 2012-12-01 DIAGNOSIS — E1165 Type 2 diabetes mellitus with hyperglycemia: Secondary | ICD-10-CM

## 2012-12-01 DIAGNOSIS — B372 Candidiasis of skin and nail: Secondary | ICD-10-CM | POA: Diagnosis present

## 2012-12-01 DIAGNOSIS — I509 Heart failure, unspecified: Secondary | ICD-10-CM | POA: Diagnosis present

## 2012-12-01 DIAGNOSIS — I739 Peripheral vascular disease, unspecified: Secondary | ICD-10-CM | POA: Diagnosis present

## 2012-12-01 DIAGNOSIS — Z87891 Personal history of nicotine dependence: Secondary | ICD-10-CM

## 2012-12-01 DIAGNOSIS — R410 Disorientation, unspecified: Secondary | ICD-10-CM

## 2012-12-01 DIAGNOSIS — Z79899 Other long term (current) drug therapy: Secondary | ICD-10-CM

## 2012-12-01 DIAGNOSIS — Z794 Long term (current) use of insulin: Secondary | ICD-10-CM

## 2012-12-01 DIAGNOSIS — R404 Transient alteration of awareness: Secondary | ICD-10-CM

## 2012-12-01 DIAGNOSIS — R001 Bradycardia, unspecified: Secondary | ICD-10-CM

## 2012-12-01 DIAGNOSIS — K529 Noninfective gastroenteritis and colitis, unspecified: Secondary | ICD-10-CM | POA: Diagnosis present

## 2012-12-01 LAB — BASIC METABOLIC PANEL
BUN: 16 mg/dL (ref 6–23)
CO2: 28 mEq/L (ref 19–32)
Calcium: 9.2 mg/dL (ref 8.4–10.5)
Chloride: 104 mEq/L (ref 96–112)
Creatinine, Ser: 0.93 mg/dL (ref 0.50–1.35)
GFR calc Af Amer: >90 mL/min (ref >90)
GFR calc non Af Amer: 82 mL/min — ABNORMAL LOW (ref >90)

## 2012-12-01 LAB — CBC WITH DIFFERENTIAL/PLATELET
Eosinophils Relative: 0 % (ref 0–5)
HCT: 43.2 % (ref 39.0–52.0)
Hemoglobin: 14 g/dL (ref 13.0–17.0)
Lymphocytes Relative: 4 % — ABNORMAL LOW (ref 12–46)
MCHC: 32.4 g/dL (ref 30.0–36.0)
MCV: 89.3 fL (ref 78.0–100.0)
Monocytes Absolute: 1.1 10*3/uL — ABNORMAL HIGH (ref 0.1–1.0)
Monocytes Relative: 7 % (ref 3–12)
Neutro Abs: 13.7 10*3/uL — ABNORMAL HIGH (ref 1.7–7.7)
RDW: 13.2 % (ref 11.5–15.5)
WBC: 15.4 10*3/uL — ABNORMAL HIGH (ref 4.0–10.5)

## 2012-12-01 LAB — URINALYSIS, ROUTINE W REFLEX MICROSCOPIC
Bilirubin Urine: NEGATIVE
Ketones, ur: 15 mg/dL — AB
Protein, ur: NEGATIVE mg/dL
Specific Gravity, Urine: >1.03 — ABNORMAL HIGH (ref 1.005–1.030)
Urobilinogen, UA: 0.2 mg/dL (ref 0.0–1.0)

## 2012-12-01 MED ORDER — NYSTATIN 100000 UNIT/GM EX POWD
CUTANEOUS | Status: AC
  Filled 2012-12-01: qty 15

## 2012-12-01 MED ORDER — INSULIN ASPART 100 UNIT/ML ~~LOC~~ SOLN: 0.0000 [IU] | Freq: Every day | SUBCUTANEOUS | Status: DC

## 2012-12-01 MED ORDER — ENOXAPARIN SODIUM 30 MG/0.3ML ~~LOC~~ SOLN: 30.0000 mg | SUBCUTANEOUS | Status: DC

## 2012-12-01 MED ORDER — INSULIN GLARGINE 100 UNIT/ML ~~LOC~~ SOLN
15.0000 [IU] | Freq: Every day | SUBCUTANEOUS | Status: DC
  Administered 2012-12-02 (×2): 15 [IU] via SUBCUTANEOUS
  Filled 2012-12-01 (×3): qty 0.15

## 2012-12-01 MED ORDER — NYSTATIN 100000 UNIT/GM EX POWD
Freq: Two times a day (BID) | CUTANEOUS | Status: DC
  Administered 2012-12-01 – 2012-12-03 (×4): via TOPICAL
  Filled 2012-12-01 (×2): qty 15

## 2012-12-01 MED ORDER — SODIUM CHLORIDE 0.9 % IV BOLUS (SEPSIS)
1000.0000 mL | Freq: Once | INTRAVENOUS | Status: AC
Start: 2012-12-01 — End: 2012-12-01
  Administered 2012-12-01: 1000 mL via INTRAVENOUS

## 2012-12-01 MED ORDER — DEXTROSE 5 % IV SOLN
1.0000 g | INTRAVENOUS | Status: DC
  Administered 2012-12-02: 1 g via INTRAVENOUS
  Filled 2012-12-01: qty 10

## 2012-12-01 MED ORDER — DEXTROSE 5 % IV SOLN
500.0000 mg | Freq: Once | INTRAVENOUS | Status: AC
  Administered 2012-12-01: 500 mg via INTRAVENOUS
  Filled 2012-12-01: qty 500

## 2012-12-01 MED ORDER — INSULIN ASPART 100 UNIT/ML ~~LOC~~ SOLN
0.0000 [IU] | Freq: Three times a day (TID) | SUBCUTANEOUS | Status: DC
  Administered 2012-12-02 – 2012-12-03 (×4): 1 [IU] via SUBCUTANEOUS

## 2012-12-01 MED ORDER — ACETAMINOPHEN 325 MG PO TABS
650.0000 mg | ORAL_TABLET | ORAL | Status: DC | PRN
  Administered 2012-12-02: 650 mg via ORAL
  Filled 2012-12-01: qty 2

## 2012-12-01 MED ORDER — DEXTROSE 5 % IV SOLN
1.0000 g | Freq: Once | INTRAVENOUS | Status: AC
  Administered 2012-12-01: 1 g via INTRAVENOUS
  Filled 2012-12-01: qty 10

## 2012-12-01 MED ORDER — ONDANSETRON HCL 4 MG/2ML IJ SOLN: 4.0000 mg | INTRAMUSCULAR | Status: DC | PRN

## 2012-12-01 MED ORDER — ACETAMINOPHEN 500 MG PO TABS
1000.0000 mg | ORAL_TABLET | Freq: Once | ORAL | Status: AC
  Administered 2012-12-01: 1000 mg via ORAL
  Filled 2012-12-01: qty 2

## 2012-12-01 MED ORDER — DEXTROSE 5 % IV SOLN
500.0000 mg | INTRAVENOUS | Status: DC
  Administered 2012-12-02: 500 mg via INTRAVENOUS
  Filled 2012-12-01: qty 500

## 2012-12-01 NOTE — ED Provider Notes (Signed)
CSN: [id number]     Arrival date & time 12/01/12  2040 History     First MD Initiated Contact with Patient 12/01/12 2041     Chief Complaint  Patient presents with  . Fever  . Shortness of Breath   (Consider location/radiation/quality/duration/timing/severity/associated sxs/prior Treatment) HPI Comments: 72 yo male with htn, aaa, cad, dementia presents from home with fever, general weakness and confusion for one day.  Pt denies recent hospitalizations, no known current abx or sick contacts.  Pt has home care to assist.  Tolerating po.  Nothing improves sxs.  Past smoker.  No etoh.  EMS brought in, on route O2 in mid 80%, placed on [place] 2L.    Patient is a 72 y.o. male presenting with fever and shortness of breath. The history is provided by the patient and the EMS personnel.  Fever Associated symptoms: no chest pain, no chills, no cough, no diarrhea, no dysuria, no headaches, no rash and no vomiting   Shortness of Breath Associated symptoms: fever   Associated symptoms: no abdominal pain, no chest pain, no cough, no headaches, no neck pain, no rash and no vomiting     Past Medical History  Diagnosis Date  . HTN (hypertension)   . DM (diabetes mellitus)   . Hypothyroidism   . Hernia   . Dizziness   . AAA (abdominal aortic aneurysm)     Dr. Edilia Bo  . Hyperlipidemia   . Leg pain     with walking  . 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 CAD (coronary artery disease)     EF 57% by Myoview 4/12;  cath 5/12: dLM 50%, pLAD 50%, pCFX 50%, OM1 occluded with distal filling with L-L collats, mRCA 70-75%, dRCA 80%;  severe RCA needs PCI but treated medically due to need for colon surgery and plans to possilby perform PCI in future  . PAD (peripheral artery disease)     c/b dry gangrene of toes  . CHF (congestive heart failure)   . COPD (chronic obstructive pulmonary disease)    Past Surgical History  Procedure Laterality Date  . Hernia repair    . Appendectomy     . Cardiac catheterization    . Colon surgery  12/12/10    r/t colon CA  . Abdominal surgery    . Abdominal aortic aneurysm repair  02/17/11    Open AAA repair and Right Fem-Pop  . Colonoscopy  03/14/2012    Procedure: COLONOSCOPY;  Surgeon: Malissa Hippo, MD;  Location: [facility];  Service: Endoscopy;  Laterality: N/A;  345-Ann to notify pt of new time   Family History  Problem Relation Age of Onset  . Diabetes Mother   . Heart disease Mother     heart failure  . Heart disease Brother   . Other Brother 72    MRSA  . COPD Father   . Heart disease Father     heart failure  . COPD Sister    History  Substance Use Topics  . Smoking status: Former Smoker -- 2.00 packs/day for 50 years    Types: Cigarettes    Quit date: 12/11/2010  . Smokeless tobacco: Never Used  . Alcohol Use: No    Review of Systems  Constitutional: Positive for fever and appetite change. Negative for chills.  HENT: Negative for neck pain and neck stiffness.   Eyes: Negative for visual disturbance.  Respiratory: Positive for shortness of breath. Negative for cough.  Cardiovascular: Negative for chest pain and leg swelling.  Gastrointestinal: Negative for vomiting, abdominal pain and diarrhea.  Genitourinary: Positive for frequency. Negative for dysuria.  Musculoskeletal: Negative for back pain.  Skin: Negative for rash.  Neurological: Positive for weakness. Negative for headaches.    Allergies  Review of patient's allergies indicates no known allergies.  Home Medications   Current Outpatient Rx  Name  Route  Sig  Dispense  Refill  . amLODipine (NORVASC) 5 MG tablet   Oral   Take 5 mg by mouth daily.         [name] aspirin EC 81 MG tablet   Oral   Take 81 mg by mouth daily.           [name] atenolol (TENORMIN) 50 MG tablet   Oral   Take 50 mg by mouth daily.           [name] donepezil (ARICEPT) 10 MG tablet   Oral   Take 10 mg by mouth at bedtime.          [name] glipiZIDE (GLUCOTROL XL) 5 MG  24 hr tablet   Oral   Take 5 mg by mouth daily.         [name] levothyroxine (SYNTHROID, LEVOTHROID) 175 MCG tablet   Oral   Take 175 mcg by mouth daily.         . metFORMIN (GLUCOPHAGE) 500 MG tablet   Oral   Take 500 mg by mouth 2 (two) times daily.         . pravastatin (PRAVACHOL) 40 MG tablet   Oral   Take 40 mg by mouth daily.          . cholestyramine light (PREVALITE) 4 G packet               . LANTUS 100 UNIT/ML injection   Subcutaneous   Inject 25 Units into the skin at bedtime.          [name] loperamide (IMODIUM) 2 MG capsule   Oral   Take 2 mg by mouth 4 (four) times daily as needed for diarrhea or loose stools.         . nitroGLYCERIN (NITROSTAT) 0.4 MG SL tablet   Sublingual   Place 1 tablet (0.4 mg total) under the tongue every 5 (five) minutes as needed for chest pain.   25 tablet   3   . NOVOLOG 100 UNIT/ML injection   Subcutaneous   Inject 3 Units into the skin daily.          [name] olmesartan (BENICAR) 20 MG tablet   Oral   Take 20 mg by mouth 2 (two) times daily before a meal.          . Probiotic Product (ALIGN) 4 MG CAPS   Oral   Take by mouth daily.          BP 165/67  Pulse 88  Temp(Src) 100.6 F (38.1 C) (Oral)  Resp 22  Ht 5\' 3"  (1.6 m)  Wt 155 lb (70.308 kg)  BMI 27.46 kg/m2  SpO2 94% Physical Exam  Nursing note and vitals reviewed. Constitutional: He is oriented to person, place, and time. He appears well-developed and well-nourished.  HENT:  Head: Normocephalic and atraumatic.  Dry mm  Eyes: Conjunctivae are normal. Right eye exhibits no discharge. Left eye exhibits no discharge.  Neck: Normal range of motion. Neck supple. No tracheal deviation present.  Cardiovascular: Normal rate and regular rhythm.   Pulmonary/Chest: Effort  normal. He has rales (few bilateral lower, decreased effort).  Abdominal: Soft. He exhibits no distension. There is no tenderness. There is no guarding.  Musculoskeletal: He exhibits no  edema.  Neurological: He is alert and oriented to person, place, and time. No cranial nerve deficit.  Skin: Skin is warm. Rash noted. There is erythema.  Candida infection in lower abdominal fold - moist, erythematous w satellite lesions Another region of likely candida on right aspect of abdomen, mild warmth, no induration, no crepitus No rash on testicles  Psychiatric: He has a normal mood and affect.    ED Course   Procedures (including critical care time)  Labs Reviewed  CBC WITH DIFFERENTIAL - Abnormal; Notable for the following:    WBC 15.4 (*)    Neutrophils Relative % 89 (*)    Neutro Abs 13.7 (*)    Lymphocytes Relative 4 (*)    Lymphs Abs 0.6 (*)    Monocytes Absolute 1.1 (*)    All other components within normal limits  BASIC METABOLIC PANEL - Abnormal; Notable for the following:    Glucose, Bld 189 (*)    GFR calc non Af Amer 82 (*)    All other components within normal limits  URINALYSIS, ROUTINE W REFLEX MICROSCOPIC - Abnormal; Notable for the following:    Specific Gravity, Urine >1.030 (*)    Hgb urine dipstick TRACE (*)    Ketones, ur 15 (*)    All other components within normal limits  CULTURE, BLOOD (ROUTINE X 2)  CULTURE, BLOOD (ROUTINE X 2)  LACTIC ACID, PLASMA  URINE MICROSCOPIC-ADD ON   No results found. No diagnosis found.  MDM  Concern for UTI vs pneumonia vs less likely cellulitis.  Early sepsis work up in ED.  Cultures and labs. Fluids given and tylenol for fever.  Spoke with family, started earlier today, intermittent confusion- pt has had once in the past with infection. Dg Chest Portable 1 View  12/01/2012   *RADIOLOGY REPORT*  Clinical Data: Fever, shortness of breath  PORTABLE CHEST - 1 VIEW  Comparison: 02/17/2011; 02/19/2011; 02/17/2011  Findings:  Grossly unchanged cardiac silhouette and mediastinal contours given kyphotic projection.  Atherosclerotic plaque within the thoracic aorta.  Interval development of heterogeneous ill-defined  airspace opacities within the bilateral lower lungs.  No definite pleural effusion or pneumothorax.  No evidence of edema.  Grossly unchanged bones.  IMPRESSION: Bibasilar heterogeneous ill-defined air space opacities possibly atelectasis though worrisome for multifocal infection.  A follow-up chest radiograph in 4 to 6 weeks after treatment is recommended to ensure resolution.   Original Report Authenticated By: Tacey Ruiz, MD   Pt stable in ED on 2-3 L .   Spoke with Dr Orvan Falconer, accepted on tele.  Updated family.    Enid Skeens, MD 12/01/12 2217

## 2012-12-01 NOTE — H&P (Signed)
[facility] History and Physical  Peter Harvey  RUE:[id number]  DOB: 1940-08-09   DOA: 12/01/2012   PCP:   Kirk Ruths, MD   Chief Complaint:  Fever and acute confusion since today  HPI: Peter Harvey is a 72 y.o. male.   Elderly Caucasian gentleman with complicated past medical history including multiple vascular surgeries and partial colectomy for colon cancer development of subsequent mild dementia, managed very attentively at home by ex-mother-in-law, was fine this morning had a good breakfast but later today developed fever and became increasingly confused. Was also felt her breathing more heavily and having worsening cough. He does not use home oxygen  Brought to the emergency room found to be febrile saturating at 85% on room air, and chest x-ray suggest pneumonia  Has been wheelchair confined for about the past year since his vascular surgery  chronic diarrhea since his partial colectomy His blood sugars at home tend to range between 113 200 Occasionally chokes when eating certain types of food, but for the most part a regular diet  Rewiew of Systems:   All systems negative except as marked bold or noted in the HPI;  Constitutional:    malaise, fever and chills. ;  Eyes:   eye pain, redness and discharge. ;  ENMT:   ear pain, hoarseness, nasal congestion, sinus pressure and sore throat. ;  Cardiovascular:    chest pain, palpitations, diaphoresis, dyspnea and peripheral edema.  Respiratory:   cough, hemoptysis, wheezing and stridor. ;  Gastrointestinal:  nausea, vomiting, diarrhea 6-7/day, constipation, abdominal pain, melena, blood in stool, hematemesis, jaundice and rectal bleeding. unusual weight loss..   Genitourinary:    frequency, dysuria, incontinence,flank pain and hematuria; Musculoskeletal:   back pain and neck pain.  swelling and trauma.;  Skin: .  pruritus, rash, abrasions, bruising and skin lesion.; ulcerations Neuro:    headache,  lightheadedness and neck stiffness.  weakness, altered level of consciousness, altered mental status, extremity weakness, burning feet, involuntary movement, seizure and syncope.  Psych:    anxiety, depression, insomnia, tearfulness, panic attacks, hallucinations, paranoia, suicidal or homicidal ideation    Past Medical History  Diagnosis Date  . HTN (hypertension)   . DM (diabetes mellitus)   . Hypothyroidism   . Hernia   . Dizziness   . AAA (abdominal aortic aneurysm)     Dr. Edilia Bo  . Hyperlipidemia   . Leg pain     with walking  . 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 CAD (coronary artery disease)     EF 57% by Myoview 4/12;  cath 5/12: dLM 50%, pLAD 50%, pCFX 50%, OM1 occluded with distal filling with L-L collats, mRCA 70-75%, dRCA 80%;  severe RCA needs PCI but treated medically due to need for colon surgery and plans to possilby perform PCI in future  . PAD (peripheral artery disease)     c/b dry gangrene of toes  . CHF (congestive heart failure)   . COPD (chronic obstructive pulmonary disease)     Past Surgical History  Procedure Laterality Date  . Hernia repair    . Appendectomy    . Cardiac catheterization    . Colon surgery  12/12/10    r/t colon CA  . Abdominal surgery    . Abdominal aortic aneurysm repair  02/17/11    Open AAA repair and Right Fem-Pop  . Colonoscopy  03/14/2012    Procedure: COLONOSCOPY;  Surgeon: Malissa Hippo, MD;  Location:  [facility];  Service: Endoscopy;  Laterality: N/A;  345-Ann to notify pt of new time    Medications:  HOME MEDS: Prior to Admission medications   Medication Sig Start Date End Date Taking? Authorizing Provider  amLODipine (NORVASC) 5 MG tablet Take 5 mg by mouth daily.   Yes Historical Provider, MD  aspirin EC 81 MG tablet Take 81 mg by mouth daily.     Yes Historical Provider, MD  atenolol (TENORMIN) 50 MG tablet Take 50 mg by mouth daily.     Yes Historical Provider, MD  donepezil  (ARICEPT) 10 MG tablet Take 10 mg by mouth at bedtime.    Yes Historical Provider, MD  glipiZIDE (GLUCOTROL XL) 5 MG 24 hr tablet Take 5 mg by mouth daily.   Yes Historical Provider, MD  levothyroxine (SYNTHROID, LEVOTHROID) 175 MCG tablet Take 175 mcg by mouth daily.   Yes Historical Provider, MD  metFORMIN (GLUCOPHAGE) 500 MG tablet Take 500 mg by mouth 2 (two) times daily.   Yes Historical Provider, MD  pravastatin (PRAVACHOL) 40 MG tablet Take 40 mg by mouth daily.  07/08/12  Yes Historical Provider, MD  cholestyramine light (PREVALITE) 4 G packet  06/05/12   Historical Provider, MD  LANTUS 100 UNIT/ML injection Inject 25 Units into the skin at bedtime.  03/14/11   Historical Provider, MD  loperamide (IMODIUM) 2 MG capsule Take 2 mg by mouth 4 (four) times daily as needed for diarrhea or loose stools.    Historical Provider, MD  nitroGLYCERIN (NITROSTAT) 0.4 MG SL tablet Place 1 tablet (0.4 mg total) under the tongue every 5 (five) minutes as needed for chest pain. 07/11/12   [name], MD  NOVOLOG 100 UNIT/ML injection Inject 3 Units into the skin daily.  03/14/11   Historical Provider, MD  olmesartan (BENICAR) 20 MG tablet Take 20 mg by mouth 2 (two) times daily before a meal.     Historical Provider, MD  Probiotic Product (ALIGN) 4 MG CAPS Take by mouth daily.    Historical Provider, MD     Allergies:  No Known Allergies  Social History:   reports that he quit smoking about 1 years ago. His smoking use included Cigarettes. He has a 100 pack-year smoking history. He has never used smokeless tobacco. He reports that he does not drink alcohol or use illicit drugs.  Family History: Family History  Problem Relation Age of Onset  . Diabetes Mother   . Heart disease Mother     heart failure  . Heart disease Brother   . Other Brother 72    MRSA  . COPD Father   . Heart disease Father     heart failure  . COPD Sister      Physical Exam: Filed Vitals:   12/01/12 2041 12/01/12 2130  12/01/12 2149 12/01/12 2211  BP: 165/67 148/64    Pulse: 88 82    Temp: 100.6 F (38.1 C)   99.4 F (37.4 C)  TempSrc: Oral   Oral  Resp: 22 21    Height: 5\' 3"  (1.6 m)     Weight: 70.308 kg (155 lb)     SpO2: 94%  94%    Blood pressure 148/64, pulse 82, temperature 99.4 F (37.4 C), temperature source Oral, resp. rate 21, height 5\' 3"  (1.6 m), weight 70.308 kg (155 lb), SpO2 94.00%. Body mass index is 27.46 kg/(m^2).   GEN:  Pleasant Caucasian gentleman lying bed in no acute distress; unable to be  cooperative with exam; appears to have a full understanding and responds appropriately although he has speech impairment PSYCH:  alert and oriented x3;  anxious nor depressed; affect is appropriate. HEENT: Mucous membranes pink and anicteric; PERRLA; EOM intact; no cervical lymphadenopathy nor thyromegaly or carotid bruit; no JVD; mild chronic left facial weakness Breasts:: Not examined CHEST WALL: No tenderness CHEST: Tachypneic, coarse crackles right base HEART: Regular rate and rhythm; no murmurs rubs or gallops BACK:  no CVA tenderness ABDOMEN:  soft non-tender; no masses, no organomegaly, normal abdominal bowel sounds; extensive intertriginous candida. Rectal Exam: Not done EXTREMITIES: No bone or joint deformity; age-appropriate arthropathy of the hands and knees; no edema; no ulcerations. Vascular ischemic changes of the legs and toes Genitalia: not examined PULSES: 2+ and symmetric SKIN: Normal hydration no rash or ulceration CNS: Mild facial weakness gait not tested    Labs on Admission:  Basic Metabolic Panel:  Recent Labs Lab 12/01/12 2118  NA 139  K 4.0  CL 104  CO2 28  GLUCOSE 189*  BUN 16  CREATININE 0.93  CALCIUM 9.2   Liver Function Tests: No results found for this basename: AST, ALT, ALKPHOS, BILITOT, PROT, ALBUMIN,  in the last 168 hours No results found for this basename: LIPASE, AMYLASE,  in the last 168 hours No results found for this basename:  AMMONIA,  in the last 168 hours CBC:  Recent Labs Lab 12/01/12 2118  WBC 15.4*  NEUTROABS 13.7*  HGB 14.0  HCT 43.2  MCV 89.3  PLT 178   Cardiac Enzymes: No results found for this basename: CKTOTAL, CKMB, CKMBINDEX, TROPONINI,  in the last 168 hours BNP: No components found with this basename: POCBNP,  D-dimer: No components found with this basename: D-DIMER,  CBG: No results found for this basename: GLUCAP,  in the last 168 hours  Radiological Exams on Admission: Dg Chest Portable 1 View  12/01/2012   *RADIOLOGY REPORT*  Clinical Data: Fever, shortness of breath  PORTABLE CHEST - 1 VIEW  Comparison: 02/17/2011; 02/19/2011; 02/17/2011  Findings:  Grossly unchanged cardiac silhouette and mediastinal contours given kyphotic projection.  Atherosclerotic plaque within the thoracic aorta.  Interval development of heterogeneous ill-defined airspace opacities within the bilateral lower lungs.  No definite pleural effusion or pneumothorax.  No evidence of edema.  Grossly unchanged bones.  IMPRESSION: Bibasilar heterogeneous ill-defined air space opacities possibly atelectasis though worrisome for multifocal infection.  A follow-up chest radiograph in 4 to 6 weeks after treatment is recommended to ensure resolution.   Original Report Authenticated By: Tacey Ruiz, MD    Assessment/Plan   Active Problems:   HTN (hypertension)   CAD (coronary artery disease)   PVD (peripheral vascular disease)   CAP (community acquired pneumonia)   Candidiasis of skin   Delirium, acute   Chronic diarrhea   Diabetes type 2, uncontrolled   PLAN: Admit this gentleman for management of community-acquired pneumonia Hydration Will give her regular diabetic diet since family say there has been no change in his ability to swallow, ut will get swallowing evaluation to rule out aspiration  Other plans as per orders.  Code Status: Full code Family Communication: Plans discuss with patient and family at  bedside Disposition Plan: Likely discharge home in a few days  m Darden Flemister [employer]ist [facility] Pager [phone]   12/01/2012, 10:14 PM

## 2012-12-01 NOTE — ED Notes (Signed)
Pt arrived by [facility] from home. Called out for fever, SOB & Weakness.

## 2012-12-02 DIAGNOSIS — R1319 Other dysphagia: Secondary | ICD-10-CM | POA: Diagnosis present

## 2012-12-02 DIAGNOSIS — E1165 Type 2 diabetes mellitus with hyperglycemia: Secondary | ICD-10-CM | POA: Diagnosis present

## 2012-12-02 DIAGNOSIS — J189 Pneumonia, unspecified organism: Secondary | ICD-10-CM | POA: Diagnosis present

## 2012-12-02 DIAGNOSIS — R410 Disorientation, unspecified: Secondary | ICD-10-CM | POA: Diagnosis present

## 2012-12-02 DIAGNOSIS — K529 Noninfective gastroenteritis and colitis, unspecified: Secondary | ICD-10-CM | POA: Diagnosis present

## 2012-12-02 DIAGNOSIS — B372 Candidiasis of skin and nail: Secondary | ICD-10-CM | POA: Diagnosis present

## 2012-12-02 DIAGNOSIS — I2510 Atherosclerotic heart disease of native coronary artery without angina pectoris: Secondary | ICD-10-CM

## 2012-12-02 LAB — HEPATIC FUNCTION PANEL
ALT: 10 U/L (ref 0–53)
Alkaline Phosphatase: 86 U/L (ref 39–117)
Bilirubin, Direct: 0.1 mg/dL (ref 0.0–0.3)
Indirect Bilirubin: 0.3 mg/dL (ref 0.3–0.9)
Total Protein: 7 g/dL (ref 6.0–8.3)

## 2012-12-02 LAB — EXPECTORATED SPUTUM ASSESSMENT W GRAM STAIN, RFLX TO RESP C

## 2012-12-02 LAB — BASIC METABOLIC PANEL
CO2: 30 mEq/L (ref 19–32)
Calcium: 9.7 mg/dL (ref 8.4–10.5)
Creatinine, Ser: 0.92 mg/dL (ref 0.50–1.35)
Glucose, Bld: 152 mg/dL — ABNORMAL HIGH (ref 70–99)

## 2012-12-02 LAB — CBC
MCH: 29 pg (ref 26.0–34.0)
MCV: 89.8 fL (ref 78.0–100.0)
Platelets: 175 10*3/uL (ref 150–400)
RDW: 13.2 % (ref 11.5–15.5)

## 2012-12-02 LAB — GLUCOSE, CAPILLARY
Glucose-Capillary: 147 mg/dL — ABNORMAL HIGH (ref 70–99)
Glucose-Capillary: 148 mg/dL — ABNORMAL HIGH (ref 70–99)

## 2012-12-02 MED ORDER — IRBESARTAN 150 MG PO TABS
150.0000 mg | ORAL_TABLET | Freq: Every day | ORAL | Status: DC
  Administered 2012-12-02 – 2012-12-03 (×2): 150 mg via ORAL
  Filled 2012-12-02 (×2): qty 1

## 2012-12-02 MED ORDER — SODIUM CHLORIDE 0.9 % IV SOLN
INTRAVENOUS | Status: DC
  Filled 2012-12-02: qty 1000

## 2012-12-02 MED ORDER — LOPERAMIDE HCL 2 MG PO CAPS
4.0000 mg | ORAL_CAPSULE | Freq: Every day | ORAL | Status: DC
  Administered 2012-12-02: 4 mg via ORAL
  Filled 2012-12-02: qty 2

## 2012-12-02 MED ORDER — INSULIN GLARGINE 100 UNIT/ML ~~LOC~~ SOLN
SUBCUTANEOUS | Status: AC
  Filled 2012-12-02: qty 10

## 2012-12-02 MED ORDER — LEVOTHYROXINE SODIUM 75 MCG PO TABS
175.0000 ug | ORAL_TABLET | Freq: Every day | ORAL | Status: DC
  Administered 2012-12-02 – 2012-12-03 (×2): 175 ug via ORAL
  Filled 2012-12-02 (×2): qty 1

## 2012-12-02 MED ORDER — ENOXAPARIN SODIUM 40 MG/0.4ML ~~LOC~~ SOLN
40.0000 mg | SUBCUTANEOUS | Status: DC
  Administered 2012-12-02 – 2012-12-03 (×2): 40 mg via SUBCUTANEOUS
  Filled 2012-12-02 (×2): qty 0.4

## 2012-12-02 MED ORDER — DONEPEZIL HCL 5 MG PO TABS
10.0000 mg | ORAL_TABLET | Freq: Every day | ORAL | Status: DC
  Administered 2012-12-02 (×2): 10 mg via ORAL
  Filled 2012-12-02 (×2): qty 2

## 2012-12-02 MED ORDER — GLIPIZIDE ER 5 MG PO TB24
5.0000 mg | ORAL_TABLET | Freq: Every day | ORAL | Status: DC
  Administered 2012-12-02 – 2012-12-03 (×2): 5 mg via ORAL
  Filled 2012-12-02 (×2): qty 1

## 2012-12-02 MED ORDER — ATENOLOL 25 MG PO TABS
50.0000 mg | ORAL_TABLET | Freq: Every day | ORAL | Status: DC
  Administered 2012-12-02 – 2012-12-03 (×2): 50 mg via ORAL
  Filled 2012-12-02 (×2): qty 2

## 2012-12-02 MED ORDER — AMLODIPINE BESYLATE 5 MG PO TABS
5.0000 mg | ORAL_TABLET | Freq: Every day | ORAL | Status: DC
  Administered 2012-12-02 – 2012-12-03 (×2): 5 mg via ORAL
  Filled 2012-12-02 (×2): qty 1

## 2012-12-02 MED ORDER — POTASSIUM CHLORIDE IN NACL 20-0.9 MEQ/L-% IV SOLN
INTRAVENOUS | Status: DC
  Administered 2012-12-02 – 2012-12-03 (×3): via INTRAVENOUS

## 2012-12-02 MED ORDER — SIMVASTATIN 20 MG PO TABS
20.0000 mg | ORAL_TABLET | Freq: Every day | ORAL | Status: DC
  Administered 2012-12-02: 20 mg via ORAL
  Filled 2012-12-02: qty 1

## 2012-12-02 MED ORDER — NITROGLYCERIN 0.4 MG SL SUBL: 0.4000 mg | SUBLINGUAL_TABLET | SUBLINGUAL | Status: DC | PRN

## 2012-12-02 MED ORDER — ASPIRIN EC 81 MG PO TBEC
81.0000 mg | DELAYED_RELEASE_TABLET | Freq: Every day | ORAL | Status: DC
  Administered 2012-12-02 – 2012-12-03 (×2): 81 mg via ORAL
  Filled 2012-12-02 (×2): qty 1

## 2012-12-02 NOTE — Progress Notes (Signed)
Patient seen, examined and discussed with the nurse practitioner. Agree with her note. Patient improving. Appreciate speech therapy quick followup and will keep on dysphagia 3 diet with thin liquids. His baseline is wheelchair ambulation. Monitor O2 sats and likely will be able to discharge tomorrow.

## 2012-12-02 NOTE — Progress Notes (Addendum)
Patient was admitted with CAP and started on Rocephin & Zithromax.  Antibiotic doses reviewed for renal function.  Neither antibiotic requires renal dose adjustment.  Doses ordered appropriate for current renal function.  Pharmacy will sign off.  Thank you. Junita Push, PharmD, BCPS

## 2012-12-02 NOTE — Progress Notes (Signed)
[facility] PROGRESS NOTE  Peter Harvey NFA:[id number] DOB: September 03, 1940 DOA: 12/01/2012 PCP: Kirk Ruths, MD  Assessment/Plan: CAP (community acquired pneumonia) : improving. White count trending down. Pt afebrile and non toxic appearing. Rocephin and azithromycin day #1. Some concern for aspiration. Will request ST bedside eval  Delirium, acute : resolved. Likely related to #1.    Diabetes type 2, uncontrolled: A1c in process. CBG 194-197. Continue home lantus and use SSI for optimal control   HTN (hypertension) : fair control. Continue home med  CAD (coronary artery disease) : no chest pain. Continue home meds  PVD (peripheral vascular disease): stable at baseline   Code Status: full Family Communication: son at bedside Disposition Plan: home when ready   Consultants:  none  Procedures:  none  Antibiotics:  Rocephin 12/02/12>>>  Azithromycin 12/02/12>>>  HPI/Subjective: Awake reports feeling better. Denies sob/chest pain  Objective: Filed Vitals:   12/02/12 0220 12/02/12 0417 12/02/12 0817 12/02/12 0850  BP: 126/71 189/66  148/70  Pulse: 58 66  91  Temp: 98.1 F (36.7 C) 98 F (36.7 C)  98 F (36.7 C)  TempSrc: Oral Oral  Oral  Resp: 18 18    Height:      Weight:      SpO2: 93% 95% 95% 98%    Intake/Output Summary (Last 24 hours) at 12/02/12 1035 Last data filed at 12/02/12 0900  Gross per 24 hour  Intake   1480 ml  Output    100 ml  Net   1380 ml   Filed Weights   12/01/12 2041 12/01/12 2326  Weight: 155 lb (70.308 kg) 159 lb 13.3 oz (72.5 kg)    Exam:   General:  Well nourished, somewhat ill appearing  Cardiovascular: RRR no mgr no LE edema  Respiratory: mild increased work of breathing with conversation. BS distant with some crackles bilateral bases  Abdomen: soft +BS non-tender to palpation no guarding  Musculoskeletal: no clubbing no cyanosis    Data Reviewed: Basic Metabolic Panel:  Recent Labs Lab  12/01/12 2118 12/02/12 0527  NA 139 145  K 4.0 3.6  CL 104 106  CO2 28 30  GLUCOSE 189* 152*  BUN 16 13  CREATININE 0.93 0.92  CALCIUM 9.2 9.7   Liver Function Tests:  Recent Labs Lab 12/01/12 2118  AST 15  ALT 10  ALKPHOS 86  BILITOT 0.4  PROT 7.0  ALBUMIN 3.1*   No results found for this basename: LIPASE, AMYLASE,  in the last 168 hours No results found for this basename: AMMONIA,  in the last 168 hours CBC:  Recent Labs Lab 12/01/12 2118 12/02/12 0527  WBC 15.4* 11.9*  NEUTROABS 13.7*  --   HGB 14.0 15.1  HCT 43.2 46.8  MCV 89.3 89.8  PLT 178 175   Cardiac Enzymes: No results found for this basename: CKTOTAL, CKMB, CKMBINDEX, TROPONINI,  in the last 168 hours BNP (last 3 results) No results found for this basename: PROBNP,  in the last 8760 hours CBG:  Recent Labs Lab 12/01/12 2359 12/02/12 0733  GLUCAP 194* 147*    Recent Results (from the past 240 hour(s))  CULTURE, BLOOD (ROUTINE X 2)     Status: None   Collection Time    12/01/12  9:18 PM      Result Value Range Status   Specimen Description RIGHT ANTECUBITAL   Final   Special Requests BOTTLES DRAWN AEROBIC AND ANAEROBIC 8CC   Final   Culture PENDING   Incomplete  Report Status PENDING   Incomplete  CULTURE, BLOOD (ROUTINE X 2)     Status: None   Collection Time    12/01/12  9:25 PM      Result Value Range Status   Specimen Description BLOOD LEFT HAND   Final   Special Requests BOTTLES DRAWN AEROBIC ONLY 6CC   Final   Culture PENDING   Incomplete   Report Status PENDING   Incomplete  CULTURE, EXPECTORATED SPUTUM-ASSESSMENT     Status: None   Collection Time    12/02/12  1:05 AM      Result Value Range Status   Specimen Description SPUTUM EXPECTORATED   Final   Special Requests NONE   Final   Sputum evaluation     Final   Value: THIS SPECIMEN IS ACCEPTABLE. RESPIRATORY CULTURE REPORT TO FOLLOW.     Performed at [facility]   Report Status 12/02/2012 FINAL   Final  CULTURE,  RESPIRATORY (NON-EXPECTORATED)     Status: None   Collection Time    12/02/12  1:05 AM      Result Value Range Status   Specimen Description SPUTUM EXPECTORATED   Final   Special Requests NONE   Final   Gram Stain PENDING   Incomplete   Culture PENDING   Incomplete   Report Status PENDING   Incomplete     Studies: Dg Chest Portable 1 View  12/01/2012   *RADIOLOGY REPORT*  Clinical Data: Fever, shortness of breath  PORTABLE CHEST - 1 VIEW  Comparison: 02/17/2011; 02/19/2011; 02/17/2011  Findings:  Grossly unchanged cardiac silhouette and mediastinal contours given kyphotic projection.  Atherosclerotic plaque within the thoracic aorta.  Interval development of heterogeneous ill-defined airspace opacities within the bilateral lower lungs.  No definite pleural effusion or pneumothorax.  No evidence of edema.  Grossly unchanged bones.  IMPRESSION: Bibasilar heterogeneous ill-defined air space opacities possibly atelectasis though worrisome for multifocal infection.  A follow-up chest radiograph in 4 to 6 weeks after treatment is recommended to ensure resolution.   Original Report Authenticated By: Tacey Ruiz, MD    Scheduled Meds: . amLODipine  5 mg Oral Daily  . aspirin EC  81 mg Oral Daily  . atenolol  50 mg Oral Daily  . azithromycin  500 mg Intravenous Q24H  . cefTRIAXone (ROCEPHIN)  IV  1 g Intravenous Q24H  . donepezil  10 mg Oral QHS  . enoxaparin (LOVENOX) injection  40 mg Subcutaneous Q24H  . glipiZIDE  5 mg Oral Q breakfast  . insulin aspart  0-5 Units Subcutaneous QHS  . insulin aspart  0-9 Units Subcutaneous TID WC  . insulin glargine  15 Units Subcutaneous QHS  . irbesartan  150 mg Oral Daily  . levothyroxine  175 mcg Oral QAC breakfast  . loperamide  4 mg Oral QHS  . nystatin   Topical BID  . simvastatin  20 mg Oral q1800   Continuous Infusions: . 0.9 % NaCl with KCl 20 mEq / L 100 mL/hr at 12/02/12 1610    Active Problems:   HTN (hypertension)   CAD (coronary  artery disease)   PVD (peripheral vascular disease)   CAP (community acquired pneumonia)   Candidiasis of skin   Delirium, acute   Chronic diarrhea   Diabetes type 2, uncontrolled    Time spent: 35 minutes    [facility] M  [facility] Pager [phone]. If 7PM-7AM, please contact night-coverage at [email or web address], password [facility] 12/02/2012, 10:35 AM  LOS: 1 day

## 2012-12-02 NOTE — Evaluation (Signed)
Clinical/Bedside Swallow Evaluation  Patient Details  Name: Peter Harvey MRN: [id number] Date of Birth: [birth year]  Today's Date: 12/02/2012 Time: 1630-1710 SLP Time Calculation (min): 40 min  Past Medical History:  Past Medical History  Diagnosis Date  . HTN (hypertension)   . DM (diabetes mellitus)   . Hypothyroidism   . Hernia   . Dizziness   . AAA (abdominal aortic aneurysm)     Dr. Edilia Bo  . Hyperlipidemia   . Leg pain     with walking  . 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 CAD (coronary artery disease)     EF 57% by Myoview 4/12;  cath 5/12: dLM 50%, pLAD 50%, pCFX 50%, OM1 occluded with distal filling with L-L collats, mRCA 70-75%, dRCA 80%;  severe RCA needs PCI but treated medically due to need for colon surgery and plans to possilby perform PCI in future  . PAD (peripheral artery disease)     c/b dry gangrene of toes  . CHF (congestive heart failure)   . COPD (chronic obstructive pulmonary disease)    Past Surgical History:  Past Surgical History  Procedure Laterality Date  . Hernia repair    . Appendectomy    . Cardiac catheterization    . Colon surgery  12/12/10    r/t colon CA  . Abdominal surgery    . Abdominal aortic aneurysm repair  02/17/11    Open AAA repair and Right Fem-Pop  . Colonoscopy  03/14/2012    Procedure: COLONOSCOPY;  Surgeon: Malissa Hippo, MD;  Location: [facility];  Service: Endoscopy;  Laterality: N/A;  345-Ann to notify pt of new time   HPI:  Peter Harvey is a 72 y.o. male who was admitted with community acquired pneumonia and delirium. His past medical history includes multiple vascular surgeries and partial colectomy for colon cancer development of subsequent mild dementia, managed very attentively at home by ex-mother-in-law, was fine in the morning and had a good breakfast but later developed fever and became increasingly confused. Was also breathing more heavily and having worsening cough. He  does not use home oxygen. Toya Smothers requests clinical swallow evaluation due to possible concern for aspiration.   Assessment / Plan / Recommendation Clinical Impression  Mr. Carias sounds congested prior to po administration and says that he has sounded congested since his "lung cancer", however chart review shows colon cancer? Pt shows no overt signs/symptoms of aspiration with consistencies presented, however he remains congested. Continue diet as ordered and plan to speak with caregiver tomorrow to get her input as to whether pt shows difficulty swallowing at home. He typically eats with his upper and lower dentures, but only has his lower dentures [facility] at [facility].  SLP will follow up tomorrow for diet tolerance and caregiver interview.     Aspiration Risk  Mild    Diet Recommendation Dysphagia 3 (Mechanical Soft);Thin liquid (mech soft if upper dentures not present)   Liquid Administration via: Cup;Straw Medication Administration: Whole meds with liquid Supervision: Patient able to self feed;Intermittent supervision to cue for compensatory strategies Compensations: Slow rate;Small sips/bites Postural Changes and/or Swallow Maneuvers: Seated upright 90 degrees;Upright 30-60 min after meal    Other  Recommendations Oral Care Recommendations: Oral care BID Other Recommendations: Clarify dietary restrictions   Follow Up Recommendations  24 hour supervision/assistance    Frequency and Duration min 1 x/week  1 week       SLP Swallow Goals Patient  will consume recommended diet without observed clinical signs of aspiration with: Set-up Patient will utilize recommended strategies during swallow to increase swallowing safety with: Set-up   Swallow Study Prior Functional Status   Unknown, has a caregiver at home, children live down the street    General Date of Onset: 12/02/12 HPI: Peter Harvey is a 72 y.o. male who was admitted with community acquired pneumonia and delirium.  His past medical history includes multiple vascular surgeries and partial colectomy for colon cancer development of subsequent mild dementia, managed very attentively at home by ex-mother-in-law, was fine in the morning and had a good breakfast but later developed fever and became increasingly confused. Was also breathing more heavily and having worsening cough. He does not use home oxygen. Toya Smothers requests clinical swallow evaluation due to possible concern for aspiration. Type of Study: Bedside swallow evaluation Previous Swallow Assessment: None on record Diet Prior to this Study: Regular;Thin liquids (carb modified) Temperature Spikes Noted: No Respiratory Status: Supplemental O2 delivered via (comment) History of Recent Intubation: No Behavior/Cognition: Alert;Cooperative Oral Cavity - Dentition: Dentures, bottom (typically wears upper dentures but does not have them here) Self-Feeding Abilities: Needs set up Patient Positioning: Upright in bed Baseline Vocal Quality: Clear;Wet (congested) Volitional Cough: Congested Volitional Swallow: Able to elicit    Oral/Motor/Sensory Function Overall Oral Motor/Sensory Function: Appears within functional limits for tasks assessed   Ice Chips Ice chips: Within functional limits Presentation: Spoon   Thin Liquid Thin Liquid: Within functional limits Presentation: Cup;Self Fed;Straw    Nectar Thick Nectar Thick Liquid: Not tested   Honey Thick Honey Thick Liquid: Not tested   Puree Puree: Within functional limits Presentation: Spoon   Solid       Solid: Within functional limits Presentation: Self Fed      Thank you,  Havery Moros, CCC-SLP [phone]  Rahsaan Weakland 12/02/2012,5:40 PM

## 2012-12-03 DIAGNOSIS — I498 Other specified cardiac arrhythmias: Secondary | ICD-10-CM

## 2012-12-03 LAB — CBC
HCT: 49.2 % (ref 39.0–52.0)
MCH: 28.8 pg (ref 26.0–34.0)
MCV: 88.5 fL (ref 78.0–100.0)
Platelets: 178 10*3/uL (ref 150–400)
RBC: 5.56 MIL/uL (ref 4.22–5.81)
WBC: 11.4 10*3/uL — ABNORMAL HIGH (ref 4.0–10.5)

## 2012-12-03 LAB — LEGIONELLA ANTIGEN, URINE

## 2012-12-03 LAB — GLUCOSE, CAPILLARY: Glucose-Capillary: 135 mg/dL — ABNORMAL HIGH (ref 70–99)

## 2012-12-03 MED ORDER — LEVOFLOXACIN 500 MG PO TABS: 500.0000 mg | ORAL_TABLET | Freq: Every day | ORAL | Status: AC

## 2012-12-03 MED ORDER — LEVOFLOXACIN 500 MG PO TABS
500.0000 mg | ORAL_TABLET | Freq: Every day | ORAL | Status: DC
  Administered 2012-12-03: 500 mg via ORAL
  Filled 2012-12-03: qty 1

## 2012-12-03 NOTE — Progress Notes (Signed)
Patient ran a 4 beat run of v-tach.  VSS. Patient asymptomatic.  MD paged.  Will continue to monitor.

## 2012-12-03 NOTE — Discharge Summary (Signed)
Physician Discharge Summary  Peter Harvey:[id number] DOB: 16-Oct-1940 DOA: 12/01/2012  PCP: Kirk Ruths, MD  Admit date: 12/01/2012 Discharge date: 12/03/2012  Time spent: 40 minutes  Recommendations for Outpatient Follow-up:  1. Follow up with PCP 1 week for symptom evaluation  Discharge Diagnoses:  Active Problems:   HTN (hypertension)   CAD (coronary artery disease)   PVD (peripheral vascular disease)   CAP (community acquired pneumonia)   Candidiasis of skin   Delirium, acute   Chronic diarrhea   Diabetes type 2, uncontrolled   Other dysphagia   Discharge Condition: stable  Diet recommendation: carb modified  Filed Weights   12/01/12 2326 12/03/12 0500 12/03/12 0602  Weight: 159 lb 13.3 oz (72.5 kg) 154 lb 15.7 oz (70.3 kg) 162 lb 0.6 oz (73.5 kg)    History of present illness:  Peter Harvey is a 72 y.o. gentleman with complicated past medical history including multiple vascular surgeries and partial colectomy for colon cancer, development of subsequent mild dementia, managed very attentively at home by ex-daughter-in-law, was fine the morning of 12/01/12 had a good breakfast but later developed fever and became increasingly confused. It was also felt he was breathing more heavily and having worsening cough. He does not use home oxygen. Brought to the emergency room found to be febrile saturating at 85% on room air, and chest x-ray suggest pneumonia. Has been wheelchair confined for about the past year since his vascular surgery chronic diarrhea since his partial colectomy. His blood sugars at home range between 113 200  Occasionally chokes when eating certain types of food, but for the most part a regular diet      Hospital Course:  CAP (community acquired pneumonia) : Admitted to medical floor.  White count 15.4 on admission and at discharge 11.4. Pt remained afebrile and non toxic appearing. Rocephin and azithromycin provided. Pt will be discharged  with 5 more days of levaquin. There was some concern for aspiration and pt evaluated ST who recommended continuation of dysphagia 3 with thin liquids. At discharge pt oxygen saturation level >90% on room air.     Delirium, acute : resolved. Likely related to #1. At baseline at discharge.  Diabetes type 2, uncontrolled: A1c 6.0. CBG 135-148. Resume home regimen at discahrge  HTN (hypertension) : controled. Continue home med   CAD (coronary artery disease) : no chest pain during this hospitaliztion.    PVD (peripheral vascular disease): stable at baseline .   Procedures:  none  Consultations:  none  Discharge Exam: Filed Vitals:   12/03/12 0500 12/03/12 0602 12/03/12 0836 12/03/12 0841  BP:  180/85    Pulse:  73 70 68  Temp:  97.9 F (36.6 C)    TempSrc:  Oral    Resp:  20    Height:      Weight: 154 lb 15.7 oz (70.3 kg) 162 lb 0.6 oz (73.5 kg)    SpO2:  100% 92% 91%    General: well nourished NAD Cardiovascular: RRR No MGR No LE edema Respiratory: normal effort. BS with diffuse rhonchi no wheeze no  Crackles Abdomen: soft +BS non-tender to palpation  Discharge Instructions   Future Appointments Provider Department Dept Phone   02/18/2013 10:30 AM Len Blalock, NP [facility] [phone]       Medication List         amLODipine 5 MG tablet  Commonly known as:  NORVASC  Take 5 mg by mouth daily.  aspirin EC 81 MG tablet  Take 81 mg by mouth daily.     atenolol 50 MG tablet  Commonly known as:  TENORMIN  Take 50 mg by mouth daily.     donepezil 10 MG tablet  Commonly known as:  ARICEPT  Take 10 mg by mouth at bedtime.     glipiZIDE 5 MG 24 hr tablet  Commonly known as:  GLUCOTROL XL  Take 5 mg by mouth daily.     LANTUS 100 UNIT/ML injection  Generic drug:  insulin glargine  Inject 15 Units into the skin at bedtime.     levofloxacin 500 MG tablet  Commonly known as:  LEVAQUIN  Take 1 tablet (500 mg total) by mouth  daily.     levothyroxine 175 MCG tablet  Commonly known as:  SYNTHROID, LEVOTHROID  Take 175 mcg by mouth daily.     loperamide 2 MG capsule  Commonly known as:  IMODIUM  Take 4 mg by mouth at bedtime.     metFORMIN 500 MG tablet  Commonly known as:  GLUCOPHAGE  Take 500 mg by mouth 2 (two) times daily.     nitroGLYCERIN 0.4 MG SL tablet  Commonly known as:  NITROSTAT  Place 1 tablet (0.4 mg total) under the tongue every 5 (five) minutes as needed for chest pain.     NOVOLOG 100 UNIT/ML injection  Generic drug:  insulin aspart  Inject 3-5 Units into the skin 3 (three) times daily before meals. Sliding scale (patient normally only takes with morning meals     olmesartan 20 MG tablet  Commonly known as:  BENICAR  Take 20 mg by mouth 2 (two) times daily before a meal.     pravastatin 40 MG tablet  Commonly known as:  PRAVACHOL  Take 40 mg by mouth daily.       No Known Allergies    The results of significant diagnostics from this hospitalization (including imaging, microbiology, ancillary and laboratory) are listed below for reference.    Significant Diagnostic Studies: Dg Chest Portable 1 View  12/01/2012   *RADIOLOGY REPORT*  Clinical Data: Fever, shortness of breath  PORTABLE CHEST - 1 VIEW  Comparison: 02/17/2011; 02/19/2011; 02/17/2011  Findings:  Grossly unchanged cardiac silhouette and mediastinal contours given kyphotic projection.  Atherosclerotic plaque within the thoracic aorta.  Interval development of heterogeneous ill-defined airspace opacities within the bilateral lower lungs.  No definite pleural effusion or pneumothorax.  No evidence of edema.  Grossly unchanged bones.  IMPRESSION: Bibasilar heterogeneous ill-defined air space opacities possibly atelectasis though worrisome for multifocal infection.  A follow-up chest radiograph in 4 to 6 weeks after treatment is recommended to ensure resolution.   Original Report Authenticated By: Tacey Ruiz, MD     Microbiology: Recent Results (from the past 240 hour(s))  CULTURE, BLOOD (ROUTINE X 2)     Status: None   Collection Time    12/01/12  9:18 PM      Result Value Range Status   Specimen Description RIGHT ANTECUBITAL   Final   Special Requests BOTTLES DRAWN AEROBIC AND ANAEROBIC 8CC   Final   Culture NO GROWTH 2 DAYS   Final   Report Status PENDING   Incomplete  CULTURE, BLOOD (ROUTINE X 2)     Status: None   Collection Time    12/01/12  9:25 PM      Result Value Range Status   Specimen Description BLOOD LEFT HAND   Final   Special Requests  BOTTLES DRAWN AEROBIC ONLY 6CC   Final   Culture NO GROWTH 2 DAYS   Final   Report Status PENDING   Incomplete  CULTURE, EXPECTORATED SPUTUM-ASSESSMENT     Status: None   Collection Time    12/02/12  1:05 AM      Result Value Range Status   Specimen Description SPUTUM EXPECTORATED   Final   Special Requests NONE   Final   Sputum evaluation     Final   Value: THIS SPECIMEN IS ACCEPTABLE. RESPIRATORY CULTURE REPORT TO FOLLOW.     Performed at [facility]   Report Status 12/02/2012 FINAL   Final  CULTURE, RESPIRATORY (NON-EXPECTORATED)     Status: None   Collection Time    12/02/12  1:05 AM      Result Value Range Status   Specimen Description SPUTUM EXPECTORATED   Final   Special Requests NONE   Final   Gram Stain     Final   Value: ABUNDANT WBC PRESENT, PREDOMINANTLY PMN     RARE SQUAMOUS EPITHELIAL CELLS PRESENT     MODERATE GRAM POSITIVE COCCI     IN PAIRS IN CHAINS FEW GRAM POSITIVE RODS     RARE GRAM NEGATIVE RODS   Culture Culture reincubated for better growth   Final   Report Status PENDING   Incomplete     Labs: Basic Metabolic Panel:  Recent Labs Lab 12/01/12 2118 12/02/12 0527  NA 139 145  K 4.0 3.6  CL 104 106  CO2 28 30  GLUCOSE 189* 152*  BUN 16 13  CREATININE 0.93 0.92  CALCIUM 9.2 9.7   Liver Function Tests:  Recent Labs Lab 12/01/12 2118  AST 15  ALT 10  ALKPHOS 86  BILITOT 0.4  PROT  7.0  ALBUMIN 3.1*   No results found for this basename: LIPASE, AMYLASE,  in the last 168 hours No results found for this basename: AMMONIA,  in the last 168 hours CBC:  Recent Labs Lab 12/01/12 2118 12/02/12 0527 12/03/12 0529  WBC 15.4* 11.9* 11.4*  NEUTROABS 13.7*  --   --   HGB 14.0 15.1 16.0  HCT 43.2 46.8 49.2  MCV 89.3 89.8 88.5  PLT 178 175 178   Cardiac Enzymes: No results found for this basename: CKTOTAL, CKMB, CKMBINDEX, TROPONINI,  in the last 168 hours BNP: BNP (last 3 results) No results found for this basename: PROBNP,  in the last 8760 hours CBG:  Recent Labs Lab 12/02/12 0733 12/02/12 1155 12/02/12 1710 12/02/12 2117 12/03/12 0732  GLUCAP 147* 142* 145* 148* 135*       Signed:  Martin Belling M  [facility] 12/03/2012, 10:08 AM

## 2012-12-03 NOTE — Progress Notes (Signed)
Pt discharged to home via wheelchair.  Pt caregiver verbalizes understanding of discharge instructions.  Caregiver educated concerning yeast build up on patients groin and stomach folds.  Pt caregiver given one prescription and first dose given here at the hospital.  Pt IV removed and clean dressing applied.  Pt escorted via wheelchair to front door.

## 2012-12-03 NOTE — Discharge Summary (Signed)
Patient seen, examined and discussed with my nurse practitioner. He is looking better today. I discussed the plan with the patient as well as his son who is present at the bedside. Patient does have 24-hour care at home. Currently treating comfortably on room air. He does his baseline an ambulance by wheelchair. We'll have him follow up with his PCP in the next one to 2 weeks.

## 2012-12-03 NOTE — Progress Notes (Signed)
Patient maintains an 02 sat of 92% on room air.

## 2012-12-03 NOTE — Progress Notes (Signed)
PT Cancellation Note  Patient Details Name: Peter Harvey MRN: [id number] DOB: March 06, 1941   Cancelled Treatment:    Reason Eval/Treat Not Completed: PT screened, no needs identified, will sign off Pt states that he has a CG, 24 hours/day.  Both pt and step son state that he is doing well at home and no PT eval. Is needed.  [name] L 12/03/2012, 8:51 AM

## 2012-12-04 LAB — CULTURE, RESPIRATORY W GRAM STAIN

## 2012-12-06 LAB — CULTURE, BLOOD (ROUTINE X 2)
Culture: NO GROWTH
Culture: NO GROWTH

## 2013-02-18 ENCOUNTER — Ambulatory Visit (INDEPENDENT_AMBULATORY_CARE_PROVIDER_SITE_OTHER): Payer: Medicare Other | Admitting: Internal Medicine

## 2013-03-24 ENCOUNTER — Encounter: Payer: Self-pay | Admitting: Cardiovascular Disease

## 2013-03-24 ENCOUNTER — Ambulatory Visit (INDEPENDENT_AMBULATORY_CARE_PROVIDER_SITE_OTHER): Payer: Medicare Other | Admitting: Cardiovascular Disease

## 2013-03-24 VITALS — BP 145/78 | HR 57 | Ht 70.0 in | Wt 158.0 lb

## 2013-03-24 DIAGNOSIS — I714 Abdominal aortic aneurysm, without rupture, unspecified: Secondary | ICD-10-CM

## 2013-03-24 DIAGNOSIS — I739 Peripheral vascular disease, unspecified: Secondary | ICD-10-CM

## 2013-03-24 NOTE — Patient Instructions (Signed)
Your physician recommends that you schedule a follow-up appointment in: 6 MONTHS  Your physician recommends THAT YOU SET UP A FOLLOW UP APPOINTMENT WITH CHRIS DIXON AT [facility]  Your physician has requested that you have an abdominal aorta duplex. During this test, an ultrasound is used to evaluate the aorta. Allow 30 minutes for this exam. Do not eat after midnight the day before and avoid carbonated beverages  Your physician has requested that you have an ankle brachial index (ABI). During this test an ultrasound and blood pressure cuff are used to evaluate the arteries that supply the arms and legs with blood. Allow thirty minutes for this exam. There are no restrictions or special instructions.  WE WILL CALL YOU WITH YOUR TEST RESULTS/INSTRUCTIONS/NEXT STEPS ONCE RECEIVED BY THE PROVIDER

## 2013-03-24 NOTE — Assessment & Plan Note (Signed)
F/U duplex to assess repair  No palpable mass

## 2013-03-24 NOTE — Assessment & Plan Note (Signed)
Discussed low carb diet.  Target hemoglobin A1c is 6.5 or less.  Continue current medications.  

## 2013-03-24 NOTE — Assessment & Plan Note (Signed)
Well controlled.  Continue current medications and low sodium Dash type diet.    

## 2013-03-24 NOTE — Assessment & Plan Note (Signed)
Stable with no angina and good activity level.  Continue medical Rx  

## 2013-03-24 NOTE — Assessment & Plan Note (Signed)
Will check ABI"s and arrange f/u with Dr Edilia Bo

## 2013-03-24 NOTE — Progress Notes (Signed)
Patient ID: Peter Harvey, male   DOB: January 31, 1941, 72 y.o.   MRN: [id number] Peter Harvey is a 72 y.o. male with a complex history. He has CAD with cath 5/12: dLM 50%, pLAD 50%, pCFX 50%, OM1 occluded with distal filling with L-L collats, mRCA 70-75%, dRCA 80%. He also has severe PAD, followed by Dr. Edilia Bo. 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 Subsequently had AAA resection and aortobifem by Dr Durwin Nora 10/13. Had to go to SNF and continues to be mostly wheel chair bound with dementia. Ex daughter in law lives with him and seems attentive. Some dyspnea and anxiety. No real chest pain. Legs less painful since aorto bifem Has stopped smoking 6 months ago. Amlodipne recently added for HTN  Not walking except bed to chair and bathroom  Doing ok otherwise.    ROS: Denies fever, malais, weight loss, blurry vision, decreased visual acuity, cough, sputum, SOB, hemoptysis, pleuritic pain, palpitaitons, heartburn, abdominal pain, melena, lower extremity edema, claudication, or rash.  All other systems reviewed and negative  General: Affect appropriate Chronically ill male HEENT: normal Neck supple with no adenopathy JVP normal no bruits no thyromegaly Lungs clear with no wheezing and good diaphragmatic motion Heart:  S1/S2 no murmur, no rub, gallop or click PMI normal Abdomen: benighn, BS positve, no tenderness, S/P AAA repair no bruit.  No HSM or HJR Distal pulses ihard to palpate below popliteal No edema Neuro non-focal Skin warm and dry LE bilateral quad weakness   Current Outpatient Prescriptions  Medication Sig Dispense Refill  . amLODipine (NORVASC) 5 MG tablet Take 5 mg by mouth daily.      [name] aspirin EC 81 MG tablet Take 81 mg by mouth daily.        [name] atenolol (TENORMIN) 50 MG tablet Take 50 mg by mouth daily.        [name] donepezil (ARICEPT) 10 MG tablet Take 10 mg by mouth at bedtime.        [name] LANTUS 100 UNIT/ML injection Inject 15 Units into the skin at bedtime.       [name] levothyroxine (SYNTHROID, LEVOTHROID) 175 MCG tablet Take 175 mcg by mouth daily.      [name] loperamide (IMODIUM) 2 MG capsule Take 4 mg by mouth at bedtime.       . nitroGLYCERIN (NITROSTAT) 0.4 MG SL tablet Place 1 tablet (0.4 mg total) under the tongue every 5 (five) minutes as needed for chest pain.  25 tablet  3  . NOVOLOG 100 UNIT/ML injection Inject 3-5 Units into the skin 3 (three) times daily before meals. Sliding scale (patient normally only takes with morning meals      . olmesartan (BENICAR) 20 MG tablet Take 20 mg by mouth 2 (two) times daily before a meal.       . pravastatin (PRAVACHOL) 40 MG tablet Take 40 mg by mouth daily.        No current facility-administered medications for this visit.    Allergies  Review of patient's allergies indicates no known allergies.  Electrocardiogram: 07/15/12 SR rate 70 PAC nonspecific ST/T wave changes  Assessment and Plan

## 2013-03-27 ENCOUNTER — Ambulatory Visit (HOSPITAL_COMMUNITY)
Admission: RE | Admit: 2013-03-27 | Discharge: 2013-03-27 | Disposition: A | Discharge status: Home / Self Care | Payer: Medicare Other | Source: Ambulatory Visit | Attending: Cardiovascular Disease | Admitting: Cardiovascular Disease

## 2013-03-27 DIAGNOSIS — I739 Peripheral vascular disease, unspecified: Secondary | ICD-10-CM

## 2013-03-27 DIAGNOSIS — S98139A Complete traumatic amputation of one unspecified lesser toe, initial encounter: Secondary | ICD-10-CM | POA: Insufficient documentation

## 2013-03-27 DIAGNOSIS — I70209 Unspecified atherosclerosis of native arteries of extremities, unspecified extremity: Secondary | ICD-10-CM | POA: Insufficient documentation

## 2013-03-27 DIAGNOSIS — I714 Abdominal aortic aneurysm, without rupture, unspecified: Secondary | ICD-10-CM | POA: Insufficient documentation

## 2013-03-27 DIAGNOSIS — E119 Type 2 diabetes mellitus without complications: Secondary | ICD-10-CM | POA: Insufficient documentation

## 2013-03-31 ENCOUNTER — Other Ambulatory Visit: Payer: Self-pay | Admitting: Vascular Surgery

## 2013-03-31 DIAGNOSIS — I739 Peripheral vascular disease, unspecified: Secondary | ICD-10-CM

## 2013-04-22 ENCOUNTER — Encounter: Payer: Self-pay | Admitting: Vascular Surgery

## 2013-04-23 ENCOUNTER — Encounter: Payer: Self-pay | Admitting: Vascular Surgery

## 2013-04-23 ENCOUNTER — Ambulatory Visit (INDEPENDENT_AMBULATORY_CARE_PROVIDER_SITE_OTHER): Payer: Medicare Other | Admitting: Vascular Surgery

## 2013-04-23 ENCOUNTER — Ambulatory Visit (HOSPITAL_COMMUNITY)
Admission: RE | Admit: 2013-04-23 | Discharge: 2013-04-23 | Disposition: A | Discharge status: Home / Self Care | Payer: Medicare Other | Source: Ambulatory Visit | Attending: Vascular Surgery | Admitting: Vascular Surgery

## 2013-04-23 VITALS — BP 170/68 | HR 51 | Resp 16 | Ht 70.5 in | Wt 155.0 lb

## 2013-04-23 DIAGNOSIS — I70219 Atherosclerosis of native arteries of extremities with intermittent claudication, unspecified extremity: Secondary | ICD-10-CM

## 2013-04-23 DIAGNOSIS — I739 Peripheral vascular disease, unspecified: Secondary | ICD-10-CM

## 2013-04-23 DIAGNOSIS — M79609 Pain in unspecified limb: Secondary | ICD-10-CM | POA: Insufficient documentation

## 2013-04-23 DIAGNOSIS — R0989 Other specified symptoms and signs involving the circulatory and respiratory systems: Secondary | ICD-10-CM | POA: Insufficient documentation

## 2013-04-23 NOTE — Assessment & Plan Note (Signed)
tthis patient has diffuse multilevel arterial occlusive disease. His aortobifemoral bypass graft is patent. His activity is very limited. I simply were to follow up visit in one year with ABIs. He knows to call sooner if he has problems.

## 2013-04-23 NOTE — Assessment & Plan Note (Signed)
This patient has a left carotid bruit. I would recommend a carotid duplex scan. However he wished to have this done at [facility] closer to home. We will see if this can be arranged by Dr. Regino Schultze.

## 2013-04-23 NOTE — Progress Notes (Signed)
[facility] [place]  Patient name: Peter Harvey MRN: [id number] DOB: Sep 17, 1940 Sex: male  REASON FOR CONSULT: Peripheral vascular disease  HPI: Peter Harvey is a 72 y.o. male who I last saw in November of 2012. He is undergone previous open repair of an abdominal aortic aneurysm and a right femoropopliteal bypass graft in October of 2012. He had an abdominal aortic aneurysm, bilateral common iliac artery aneurysms, and bilateral femoral artery aneurysms. The patient had a noninvasive study at the [facility] lab which suggested significant left lower extremity arterial occlusive disease and he was sent for vascular evaluation. Of note, the patient has been nonambulatory for several years. He denies any history of rest pain. Denies any history of nonhealing wounds but does note that he had his right third toe tip amputated and this healed adequately.    Past Medical History  Diagnosis Date  . HTN (hypertension)   . DM (diabetes mellitus)   . Hypothyroidism   . Hernia   . Dizziness   . AAA (abdominal aortic aneurysm)     Dr. Edilia Bo  . Hyperlipidemia   . Leg pain     with walking  . 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 CAD (coronary artery disease)     EF 57% by Myoview 4/12;  cath 5/12: dLM 50%, pLAD 50%, pCFX 50%, OM1 occluded with distal filling with L-L collats, mRCA 70-75%, dRCA 80%;  severe RCA needs PCI but treated medically due to need for colon surgery and plans to possilby perform PCI in future  . PAD (peripheral artery disease)     c/b dry gangrene of toes  . CHF (congestive heart failure)   . COPD (chronic obstructive pulmonary disease)    Family History  Problem Relation Age of Onset  . Diabetes Mother   . Heart disease Mother     heart failure  . Heart attack Mother   . Heart disease Brother   . Other Brother 72    MRSA  . COPD Father   . Heart disease Father     heart failure  . COPD Sister    SOCIAL  HISTORY: History  Substance Use Topics  . Smoking status: Former Smoker -- 2.00 packs/day for 50 years    Types: Cigarettes    Quit date: 12/11/2010  . Smokeless tobacco: Never Used  . Alcohol Use: No   No Known Allergies Current Outpatient Prescriptions  Medication Sig Dispense Refill  . amLODipine (NORVASC) 5 MG tablet Take 5 mg by mouth daily.      [name] aspirin EC 81 MG tablet Take 81 mg by mouth daily.        [name] atenolol (TENORMIN) 50 MG tablet Take 50 mg by mouth daily.        [name] donepezil (ARICEPT) 10 MG tablet Take 10 mg by mouth at bedtime.       [name] LANTUS 100 UNIT/ML injection Inject 15 Units into the skin at bedtime.       [name] levothyroxine (SYNTHROID, LEVOTHROID) 175 MCG tablet Take 175 mcg by mouth daily.      [name] loperamide (IMODIUM) 2 MG capsule Take 4 mg by mouth at bedtime.       . nitroGLYCERIN (NITROSTAT) 0.4 MG SL tablet Place 1 tablet (0.4 mg total) under the tongue every 5 (five) minutes as needed for chest pain.  25 tablet  3  . NOVOLOG 100 UNIT/ML injection Inject 3-5 Units  into the skin 3 (three) times daily before meals. Sliding scale (patient normally only takes with morning meals      . olmesartan (BENICAR) 20 MG tablet Take 20 mg by mouth 2 (two) times daily before a meal.       . pravastatin (PRAVACHOL) 40 MG tablet Take 40 mg by mouth daily.        No current facility-administered medications for this visit.   REVIEW OF SYSTEMS: Arly.Keller ] denotes positive finding; [  ] denotes negative finding  CARDIOVASCULAR:  [ ]  chest pain   [ ]  chest pressure   [ ]  palpitations   [ ]  orthopnea   [ ]  dyspnea on exertion   [ ]  claudication   [ ]  rest pain   [ ]  DVT   [ ]  phlebitis PULMONARY:   [ ]  productive cough   [ ]  asthma   [ ]  wheezing NEUROLOGIC:   [ ]  weakness  [ ]  paresthesias  [x ] aphasia  [ ]  amaurosis  [ ]  dizziness HEMATOLOGIC:   [ ]  bleeding problems   [ ]  clotting disorders MUSCULOSKELETAL:  [ ]  joint pain   [ ]  joint swelling [ ]  leg swelling GASTROINTESTINAL: [ ]    blood in stool  [ ]   hematemesis GENITOURINARY:  [ ]   dysuria  [ ]   hematuria PSYCHIATRIC:  [ ]  history of major depression INTEGUMENTARY:  [ ]  rashes  [ ]  ulcers CONSTITUTIONAL:  [ ]  fever   [ ]  chills  PHYSICAL EXAM: Filed Vitals:   04/23/13 1604  BP: 170/68  Pulse: 51  Resp: 16  Height: 5' 10.5" (1.791 m)  Weight: 155 lb (70.308 kg)  SpO2: 100%   Body mass index is 21.92 kg/(m^2). GENERAL: The patient is a well-nourished male, in no acute distress. The vital signs are documented above. CARDIOVASCULAR: There is a regular rate and rhythm. He has a left carotid bruit. He has palpable femoral pulses. He has a palpable right popliteal pulse. I cannot palpate pedal pulses. He has no significant lower extremity swelling. PULMONARY: There is good air exchange bilaterally without wheezing or rales. ABDOMEN: Soft and non-tender with normal pitched bowel sounds.  MUSCULOSKELETAL: There are no major deformities or cyanosis. NEUROLOGIC: No focal weakness or paresthesias are detected. SKIN: There are no ulcers or rashes noted. PSYCHIATRIC: The patient has a normal affect.  DATA:  I have reviewed his arterial Doppler study that was done on 03/27/2013. This showed evidence of severe arterial occlusive disease on the left with an ABI of 56%. ABI on the right at that time was greater than 100%.  I have independently interpreted the arterial duplex scan in our office which shows that his aortobifemoral bypass graft is patent. He has a known left superficial femoral artery occlusion.  MEDICAL ISSUES:  Atherosclerosis of native arteries of the extremities with intermittent claudication tthis patient has diffuse multilevel arterial occlusive disease. His aortobifemoral bypass graft is patent. His activity is very limited. I simply were to follow up visit in one year with ABIs. He knows to call sooner if he has problems.  Left carotid bruit This patient has a left carotid bruit. I would recommend a  carotid duplex scan. However he wished to have this done at [facility] closer to home. We will see if this can be arranged by Dr. Regino Schultze.   Jackalyn Haith S [facility] [place] Beeper: [phone]

## 2013-04-24 ENCOUNTER — Other Ambulatory Visit: Payer: Self-pay | Admitting: *Deleted

## 2013-04-24 ENCOUNTER — Telehealth: Payer: Self-pay | Admitting: *Deleted

## 2013-04-24 DIAGNOSIS — R0989 Other specified symptoms and signs involving the circulatory and respiratory systems: Secondary | ICD-10-CM

## 2013-04-24 NOTE — Telephone Encounter (Signed)
[name] More Detail >>      [name], MD      Sent: Wed April 23, 2013  5:57 PM      To: Alois Cliche, LPN              Message      Needs carotid duplex at [facility] for left bruit per [facility]               Peter Harvey  04/23/2013 3:45 PM   Office Visit  MRN:  [id number]   Description: 72 year old male  Provider: Chuck Hint, MD  Department: [facility]           Diagnoses      Atherosclerosis of native arteries of the extremities with intermittent claudication    -  Primary      440.21      PVD (peripheral vascular disease)          443.9      Pain in limb          729.5      Left carotid bruit          785.9              Reason for Visit      PVD      C/O Bilateral foot pain and weakness, duration 1 yr.               Current Vitals - Last Recorded      BP Pulse Resp Ht Wt BMI      170/68 51 16 5' 10.5" (1.791 m) 155 lb (70.308 kg) 21.92 kg/m2            SpO2                100%                         Progress Notes      Chuck Hint, MD at 04/23/2013  4:44 PM      Status: Signed               [facility] [place]   Patient name: Peter Harvey MRN: [id number]        DOB: April 06, 1941            Sex: male   REASON FOR CONSULT: Peripheral vascular disease   HPI: Peter Harvey is a 72 y.o. male who I last saw in November of 2012. He is undergone previous open repair of an abdominal aortic aneurysm and a right femoropopliteal bypass graft in October of 2012. He had an abdominal aortic aneurysm, bilateral common iliac artery aneurysms, and bilateral femoral artery aneurysms. The patient had a noninvasive study at the [facility] lab which suggested significant left lower extremity arterial occlusive disease and he was sent for vascular evaluation. Of note, the patient has been nonambulatory for several years. He denies any history of rest pain. Denies any  history of nonhealing wounds but does note that he had his right third toe tip amputated and this healed adequately.      Past Medical History   Diagnosis  Date   .  HTN (hypertension)     .  DM (diabetes mellitus)     .  Hypothyroidism     .  Hernia     .  Dizziness     .  AAA (abdominal aortic aneurysm)         Dr. Edilia Bo   .  Hyperlipidemia     .  Leg pain         with walking   .  Colon cancer         adenocarcinoma; s/p R hemicolectomy 8/12   .  CAD (coronary artery disease)         EF 57% by Myoview 4/12;  cath 5/12: dLM 50%, pLAD 50%, pCFX 50%, OM1 occluded with distal filling with L-L collats, mRCA 70-75%, dRCA 80%;  severe RCA needs PCI but treated medically due to need for colon surgery and plans to possilby perform PCI in future   .  PAD (peripheral artery disease)         c/b dry gangrene of toes   .  CHF (congestive heart failure)     .  COPD (chronic obstructive pulmonary disease)         Family History   Problem  Relation  Age of Onset   .  Diabetes  Mother     .  Heart disease  Mother         heart failure   .  Heart attack  Mother     .  Heart disease  Brother     .  Other  Brother  72       MRSA   .  COPD  Father     .  Heart disease  Father         heart failure   .  COPD  Sister        SOCIAL HISTORY: History   Substance Use Topics   .  Smoking status:  Former Smoker -- 2.00 packs/day for 50 years       Types:  Cigarettes       Quit date:  12/11/2010   .  Smokeless tobacco:  Never Used   .  Alcohol Use:  No      No Known Allergies Current Outpatient Prescriptions   Medication  Sig  Dispense  Refill   .  amLODipine (NORVASC) 5 MG tablet  Take 5 mg by mouth daily.         [name]  aspirin EC 81 MG tablet  Take 81 mg by mouth daily.           [name]  atenolol (TENORMIN) 50 MG tablet  Take 50 mg by mouth daily.           [name]  donepezil (ARICEPT) 10 MG tablet  Take 10 mg by mouth at bedtime.          [name]  LANTUS 100 UNIT/ML injection  Inject 15 Units into  the skin at bedtime.          [name]  levothyroxine (SYNTHROID, LEVOTHROID) 175 MCG tablet  Take 175 mcg by mouth daily.         [name]  loperamide (IMODIUM) 2 MG capsule  Take 4 mg by mouth at bedtime.          .  nitroGLYCERIN (NITROSTAT) 0.4 MG SL tablet  Place 1 tablet (0.4 mg total) under the tongue every 5 (five) minutes as needed for chest pain.   25 tablet   3   .  NOVOLOG 100 UNIT/ML injection  Inject 3-5 Units into the skin 3 (three) times daily before meals. Sliding scale (patient normally only takes with morning meals         .  olmesartan (BENICAR) 20 MG tablet  Take 20 mg by mouth 2 (two) times daily before a meal.          .  pravastatin (PRAVACHOL) 40 MG tablet  Take 40 mg by mouth daily.              No current facility-administered medications for this visit.      REVIEW OF SYSTEMS: Arly.Keller ] denotes positive finding; [  ] denotes negative finding   CARDIOVASCULAR:  [ ]  chest pain   [ ]  chest pressure   [ ]  palpitations   [ ]  orthopnea               [ ]  dyspnea on exertion   [ ]  claudication   [ ]  rest pain   [ ]  DVT   [ ]  phlebitis PULMONARY:   [ ]  productive cough   [ ]  asthma   [ ]  wheezing NEUROLOGIC:   [ ]  weakness  [ ]  paresthesias  [x ] aphasia  [ ]  amaurosis  [ ]  dizziness HEMATOLOGIC:   [ ]  bleeding problems   [ ]  clotting disorders MUSCULOSKELETAL:  [ ]  joint pain   [ ]  joint swelling [ ]  leg swelling GASTROINTESTINAL: [ ]   blood in stool  [ ]   hematemesis GENITOURINARY:  [ ]   dysuria  [ ]   hematuria PSYCHIATRIC:  [ ]  history of major depression INTEGUMENTARY:  [ ]  rashes  [ ]  ulcers CONSTITUTIONAL:  [ ]  fever   [ ]  chills   PHYSICAL EXAM: Filed Vitals:     04/23/13 1604   BP:  170/68   Pulse:  51   Resp:  16   Height:  5' 10.5" (1.791 m)   Weight:  155 lb (70.308 kg)   SpO2:  100%      Body mass index is 21.92 kg/(m^2). GENERAL: The patient is a well-nourished male, in no acute distress. The vital signs are documented above. CARDIOVASCULAR: There is a regular  rate and rhythm. He has a left carotid bruit. He has palpable femoral pulses. He has a palpable right popliteal pulse. I cannot palpate pedal pulses. He has no significant lower extremity swelling. PULMONARY: There is good air exchange bilaterally without wheezing or rales. ABDOMEN: Soft and non-tender with normal pitched bowel sounds.   MUSCULOSKELETAL: There are no major deformities or cyanosis. NEUROLOGIC: No focal weakness or paresthesias are detected. SKIN: There are no ulcers or rashes noted. PSYCHIATRIC: The patient has a normal affect.   DATA:   I have reviewed his arterial Doppler study that was done on 03/27/2013. This showed evidence of severe arterial occlusive disease on the left with an ABI of 56%. ABI on the right at that time was greater than 100%.   I have independently interpreted the arterial duplex scan in our office which shows that his aortobifemoral bypass graft is patent. He has a known left superficial femoral artery occlusion.   MEDICAL ISSUES:   Atherosclerosis of native arteries of the extremities with intermittent claudication tthis patient has diffuse multilevel arterial occlusive disease. His aortobifemoral bypass graft is patent. His activity is very limited. I simply were to follow up visit in one year with ABIs. He knows to call sooner if he has problems.   Left carotid bruit This patient has a left carotid bruit. I would recommend a carotid duplex scan. However he wished to have this done at [facility] closer to home. We will see if this can  be arranged by Dr. Regino Schultze.     DICKSON,CHRISTOPHER S [facility] [place] Beeper: [phone]                Atherosclerosis of native arteries of the extremities with intermittent claudication - Chuck Hint, MD at 04/23/2013  5:40 PM      Status: Written Related Problem: Atherosclerosis of native arteries of the extremities with intermittent claudication    tthis  patient has diffuse multilevel arterial occlusive disease. His aortobifemoral bypass graft is patent. His activity is very limited. I simply were to follow up visit in one year with ABIs. He knows to call sooner if he has problems.         Left carotid bruit - Chuck Hint, MD at 04/23/2013  5:41 PM      Status: Written Related Problem: Left carotid bruit    This patient has a left carotid bruit. I would recommend a carotid duplex scan. However he wished to have this done at [facility] closer to home. We will see if this can be arranged by Dr. Regino Schultze.                 Not recorded            Orders Placed This Encounter      Future Labs/Procedures      [place] ABI W/WO TBI [AVW0981 Custom]      Expected by: 06/24/2014      Expires: 09/05/2014                   Level of Service      PR OFFICE OUTPATIENT VISIT 25 MINUTES [19147]            Follow-up and Disposition      Return in about 1 year (around 04/23/2014).             All Flowsheet Templates (all recorded)      Infectious Disease Screening Flowsheet      Encounter Vitals Flowsheet      Custom Formula Data Flowsheet      Anthropometrics Flowsheet                   Referring Provider      Karleen Hampshire, MD             All Charges for This Encounter      Code Description Service Date Service Provider Modifiers Qty      [phone] PR OFFICE OUTPATIENT VISIT 25 MINUTES 04/23/2013 Chuck Hint, MD   1                Other Encounter Related Information      Allergies & Medications             Problem List             History             Patient-Entered Questionnaires        AVS Reports      Date/Time Report Action User      04/23/2013  4:59 PM After Visit Summary Printed Sallyanne Kuster Roczniak              Diabetic Foot Exam      No data filed            Diabetic Foot Form - Detailed      No data filed  Diabetic Foot Exam - Simple      No data filed               PT'S  DAUGHTER   AWARE  WILL CALL [place] AND SCHEDULE./CY

## 2013-05-06 ENCOUNTER — Ambulatory Visit (HOSPITAL_COMMUNITY): Payer: Medicare Other

## 2013-10-30 ENCOUNTER — Encounter: Payer: Self-pay | Admitting: Cardiovascular Disease

## 2013-10-30 ENCOUNTER — Ambulatory Visit (INDEPENDENT_AMBULATORY_CARE_PROVIDER_SITE_OTHER): Payer: Medicare Other | Admitting: Cardiovascular Disease

## 2013-10-30 VITALS — BP 122/80 | HR 47 | Ht 70.0 in | Wt 161.0 lb

## 2013-10-30 DIAGNOSIS — R0989 Other specified symptoms and signs involving the circulatory and respiratory systems: Secondary | ICD-10-CM

## 2013-10-30 DIAGNOSIS — I739 Peripheral vascular disease, unspecified: Secondary | ICD-10-CM

## 2013-10-30 DIAGNOSIS — I714 Abdominal aortic aneurysm, without rupture, unspecified: Secondary | ICD-10-CM

## 2013-10-30 DIAGNOSIS — I2510 Atherosclerotic heart disease of native coronary artery without angina pectoris: Secondary | ICD-10-CM

## 2013-10-30 DIAGNOSIS — I10 Essential (primary) hypertension: Secondary | ICD-10-CM

## 2013-10-30 NOTE — Assessment & Plan Note (Signed)
F/U Dr Scot Dock with ABI"s in 6 months no non healing ulcers

## 2013-10-30 NOTE — Assessment & Plan Note (Signed)
Willing to have duplex for left bruit at [facility] next week  ASA

## 2013-10-30 NOTE — Patient Instructions (Signed)
Your physician wants you to follow-up in: 6 months at either [place] or [facility] location You will receive a reminder letter in the mail two months in advance. If you don't receive a letter, please call our office to schedule the follow-up appointment.    Your physician has requested that you have a carotid duplex. This test is an ultrasound of the carotid arteries in your neck. It looks at blood flow through these arteries that supply the brain with blood. Allow one hour for this exam. There are no restrictions or special instructions.      Your physician recommends that you continue on your current medications as directed. Please refer to the Current Medication list given to you today.      Thank you for choosing [facility] !

## 2013-10-30 NOTE — Progress Notes (Signed)
Patient ID: Peter Harvey, male   DOB: December 28, 1940, 73 y.o.   MRN: [id number] Peter Harvey is a 73 y.o. male with a complex history. He has CAD with cath 5/12: dLM 50%, pLAD 50%, pCFX 50%, OM1 occluded with distal filling with L-L collats, mRCA 70-75%, dRCA 80%. He also has severe PAD, followed by Dr. Scot Dock. 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 Subsequently had AAA resection and aortobifem by Dr Doren Custard 10/13. Had to go to SNF and continues to be mostly wheel chair bound with dementia. Ex daughter in law lives with him and seems attentive. Some dyspnea and anxiety. No real chest pain. Legs less painful since aorto bifem Has stopped smoking 6 months ago. Amlodipne recently added for HTN  Not walking except bed to chair and bathroom Doing ok otherwise.   Seen by Dr Scot Dock for residual PVD post aortobifem for aneurysms.  Declined surgical intervention given co morbidities  Wanted him to have carotid duplex for left bruit but this has not been done yet   ROS: Denies fever, malais, weight loss, blurry vision, decreased visual acuity, cough, sputum, SOB, hemoptysis, pleuritic pain, palpitaitons, heartburn, abdominal pain, melena, lower extremity edema, claudication, or rash.  All other systems reviewed and negative  General: Affect appropriate Chronically ill white male  HEENT: normal Neck supple with no adenopathy JVP normal left  bruits no thyromegaly Lungs clear with no wheezing and good diaphragmatic motion Heart:  S1/S2 no murmur, no rub, gallop or click PMI normal Abdomen: benighn, BS positve, no tenderness,  aotobifem Femoral  bruit.  No HSM or HJR Distal pulses intact with no bruits No edema Neuro non-focal  Tremor in UE;s and some aphasic speech  Skin warm and dry LE muscular weakness   Current Outpatient Prescriptions  Medication Sig Dispense Refill  . amLODipine (NORVASC) 5 MG  tablet Take 5 mg by mouth daily.      [name] aspirin EC 81 MG tablet Take 81 mg by mouth daily.        [name] atenolol (TENORMIN) 50 MG tablet Take 50 mg by mouth daily.        [name] donepezil (ARICEPT) 10 MG tablet Take 10 mg by mouth at bedtime.       [name] LANTUS 100 UNIT/ML injection Inject 15 Units into the skin at bedtime.       [name] levothyroxine (SYNTHROID, LEVOTHROID) 175 MCG tablet Take 175 mcg by mouth daily.      [name] loperamide (IMODIUM) 2 MG capsule Take 4 mg by mouth at bedtime.       . nitroGLYCERIN (NITROSTAT) 0.4 MG SL tablet Place 1 tablet (0.4 mg total) under the tongue every 5 (five) minutes as needed for chest pain.  25 tablet  3  . NOVOLOG 100 UNIT/ML injection Inject 3-5 Units into the skin 3 (three) times daily before meals. Sliding scale (patient normally only takes with morning meals      . olmesartan (BENICAR) 20 MG tablet Take 20 mg by mouth 2 (two) times daily before a meal.       . pravastatin (PRAVACHOL) 40 MG tablet Take 40 mg by mouth daily.        No current facility-administered medications for this visit.    Allergies  Review of patient's allergies indicates no known allergies.  Electrocardiogram:  SR rate 52 LAD nonspecific lateral T wave changes   Assessment and Plan

## 2013-10-30 NOTE — Assessment & Plan Note (Signed)
Stable with no angina and good activity level.  Continue medical Rx  

## 2013-10-30 NOTE — Assessment & Plan Note (Signed)
F/U duplex in a year  No palpable mass on exam

## 2013-10-30 NOTE — Assessment & Plan Note (Signed)
Well controlled.  Continue current medications and low sodium Dash type diet.    

## 2013-11-03 ENCOUNTER — Ambulatory Visit (HOSPITAL_COMMUNITY)
Admission: RE | Admit: 2013-11-03 | Discharge: 2013-11-03 | Disposition: A | Discharge status: Home / Self Care | Payer: Medicare Other | Source: Ambulatory Visit | Attending: Cardiovascular Disease | Admitting: Cardiovascular Disease

## 2013-11-03 DIAGNOSIS — R0989 Other specified symptoms and signs involving the circulatory and respiratory systems: Secondary | ICD-10-CM

## 2013-11-03 DIAGNOSIS — I6529 Occlusion and stenosis of unspecified carotid artery: Secondary | ICD-10-CM | POA: Insufficient documentation

## 2014-04-10 ENCOUNTER — Encounter: Payer: Self-pay | Admitting: Cardiovascular Disease

## 2014-04-10 ENCOUNTER — Ambulatory Visit (INDEPENDENT_AMBULATORY_CARE_PROVIDER_SITE_OTHER): Payer: Medicare Other | Admitting: Cardiovascular Disease

## 2014-04-10 VITALS — BP 142/80 | HR 100 | Wt 163.0 lb

## 2014-04-10 DIAGNOSIS — I714 Abdominal aortic aneurysm, without rupture, unspecified: Secondary | ICD-10-CM

## 2014-04-10 DIAGNOSIS — I739 Peripheral vascular disease, unspecified: Secondary | ICD-10-CM

## 2014-04-10 DIAGNOSIS — E1165 Type 2 diabetes mellitus with hyperglycemia: Secondary | ICD-10-CM

## 2014-04-10 DIAGNOSIS — I2510 Atherosclerotic heart disease of native coronary artery without angina pectoris: Secondary | ICD-10-CM

## 2014-04-10 DIAGNOSIS — R001 Bradycardia, unspecified: Secondary | ICD-10-CM

## 2014-04-10 DIAGNOSIS — I10 Essential (primary) hypertension: Secondary | ICD-10-CM

## 2014-04-10 DIAGNOSIS — I2583 Coronary atherosclerosis due to lipid rich plaque: Secondary | ICD-10-CM

## 2014-04-10 DIAGNOSIS — IMO0002 Reserved for concepts with insufficient information to code with codable children: Secondary | ICD-10-CM

## 2014-04-10 NOTE — Assessment & Plan Note (Signed)
No palpable mass recent evaluation with Dr Scot Dock no residual.  Not a candidate for further surgery

## 2014-04-10 NOTE — Assessment & Plan Note (Signed)
Discussed low carb diet.  Target hemoglobin A1c is 6.5 or less.  Continue current medications.  

## 2014-04-10 NOTE — Assessment & Plan Note (Signed)
Biggest issue No skin breakdown or resting pain Not a candidate for pletal due to CAD.  At sime point he will need amputation or have non healing ulcer that will likely lead to hospice care F/U [facility] and podiatry.

## 2014-04-10 NOTE — Patient Instructions (Signed)
Your physician wants you to follow-up in: 6 months You will receive a reminder letter in the mail two months in advance. If you don't receive a letter, please call our office to schedule the follow-up appointment.     Your physician recommends that you continue on your current medications as directed. Please refer to the Current Medication list given to you today.      Thank you for choosing [facility] !        

## 2014-04-10 NOTE — Assessment & Plan Note (Signed)
Stable with no angina and good activity level.  Continue medical Rx  

## 2014-04-10 NOTE — Assessment & Plan Note (Signed)
Asymptomatic follow ECG no AV block may need to stop aricept in future this does not appear to be helping his dementia anyway

## 2014-04-10 NOTE — Assessment & Plan Note (Signed)
Well controlled.  Continue current medications and low sodium Dash type diet.    

## 2014-04-10 NOTE — Progress Notes (Signed)
Patient ID: Peter Harvey, male   DOB: Apr 17, 1941, 73 y.o.   MRN: [id number] Peter Harvey is a 73 y.o. male with a complex history. He has CAD with cath 5/12: dLM 50%, pLAD 50%, pCFX 50%, OM1 occluded with distal filling with L-L collats, mRCA 70-75%, dRCA 80%. He also has severe PAD, followed by Dr. Scot Dock. 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 Subsequently had AAA resection and aortobifem by Dr Doren Custard 10/13. Had to go to SNF and continues to be mostly wheel chair bound with dementia. Ex daughter in law lives with him and seems attentive. Some dyspnea and anxiety. No real chest pain. Legs less painful since aorto bifem Has stopped smoking 6 months ago. Amlodipne recently added for HTN  Not walking except bed to chair and bathroom Doing ok otherwise.   Seen by Dr Scot Dock for residual PVD post aortobifem for aneurysms. Declined surgical intervention given co morbidities Wanted him to have carotid duplex for left bruit but this has not been done yet Daughter in law indicates he is happy and doing well Sees podiatrist every few months        ROS: Denies fever, malais, weight loss, blurry vision, decreased visual acuity, cough, sputum, SOB, hemoptysis, pleuritic pain, palpitaitons, heartburn, abdominal pain, melena, lower extremity edema, claudication, or rash.  All other systems reviewed and negative  General: Pleasantly demented  Chronically ill frail male  HEENT: normal Neck supple with no adenopathy JVP normal bilateal  bruits no thyromegaly Lungs clear with no wheezing and good diaphragmatic motion Heart:  S1/S2 no murmur, no rub, gallop or click PMI normal Abdomen: benighn, BS positve, no tenderness, s/p aortobifem  Femoral  bruit.  No HSM or HJR Distal pulses intact with no bruits No edema Neuro non-focal Poor pulses below knees with dependant rubor and elevation pallor on right     Current Outpatient Prescriptions  Medication Sig Dispense Refill  . amLODipine (NORVASC) 5 MG tablet Take 5 mg by mouth daily.    [name] aspirin EC 81 MG tablet Take 81 mg by mouth daily.      [name] atenolol (TENORMIN) 50 MG tablet Take 50 mg by mouth daily.      [name] donepezil (ARICEPT) 10 MG tablet Take 10 mg by mouth at bedtime.     [name] LANTUS 100 UNIT/ML injection Inject 15 Units into the skin at bedtime.     [name] levothyroxine (SYNTHROID, LEVOTHROID) 175 MCG tablet Take 175 mcg by mouth daily.    [name] loperamide (IMODIUM) 2 MG capsule Take 4 mg by mouth at bedtime.     . nitroGLYCERIN (NITROSTAT) 0.4 MG SL tablet Place 1 tablet (0.4 mg total) under the tongue every 5 (five) minutes as needed for chest pain. 25 tablet 3  . NOVOLOG 100 UNIT/ML injection Inject 3-5 Units into the skin 3 (three) times daily before meals. Sliding scale (patient normally only takes with morning meals    . olmesartan (BENICAR) 20 MG tablet Take 20 mg by mouth 2 (two) times daily before a meal.     . pravastatin (PRAVACHOL) 40 MG tablet Take 40 mg by mouth daily.     [name] gabapentin (NEURONTIN) 100 MG capsule 100 mg 3 (three) times daily.     . memantine (NAMENDA) 5 MG tablet      No current facility-administered medications for this visit.    Allergies  Review of patient's allergies indicates  no known allergies.  Electrocardiogram: SR rate 52 LAD nonspecific ST changes 6/15    Assessment and Plan

## 2014-04-29 ENCOUNTER — Ambulatory Visit: Payer: Medicare Other | Admitting: Vascular Surgery

## 2014-04-29 ENCOUNTER — Encounter (HOSPITAL_COMMUNITY): Payer: Medicare Other

## 2014-05-05 ENCOUNTER — Encounter: Payer: Self-pay | Admitting: Vascular Surgery

## 2014-05-06 ENCOUNTER — Ambulatory Visit: Payer: Medicare Other | Admitting: Vascular Surgery

## 2014-05-06 ENCOUNTER — Encounter (HOSPITAL_COMMUNITY): Payer: Medicare Other

## 2015-01-05 ENCOUNTER — Inpatient Hospital Stay (HOSPITAL_COMMUNITY)
Admission: EM | Admit: 2015-01-05 | Discharge: 2015-01-07 | DRG: 194 | Disposition: A | Discharge status: Home / Self Care | Payer: Medicare Other | Attending: Family Medicine | Admitting: Family Medicine

## 2015-01-05 ENCOUNTER — Emergency Department (HOSPITAL_COMMUNITY): Payer: Medicare Other

## 2015-01-05 ENCOUNTER — Encounter (HOSPITAL_COMMUNITY): Payer: Self-pay | Admitting: Emergency Medicine

## 2015-01-05 DIAGNOSIS — R531 Weakness: Secondary | ICD-10-CM

## 2015-01-05 DIAGNOSIS — Z7982 Long term (current) use of aspirin: Secondary | ICD-10-CM

## 2015-01-05 DIAGNOSIS — Z8249 Family history of ischemic heart disease and other diseases of the circulatory system: Secondary | ICD-10-CM

## 2015-01-05 DIAGNOSIS — J189 Pneumonia, unspecified organism: Secondary | ICD-10-CM | POA: Diagnosis present

## 2015-01-05 DIAGNOSIS — I2510 Atherosclerotic heart disease of native coronary artery without angina pectoris: Secondary | ICD-10-CM | POA: Diagnosis present

## 2015-01-05 DIAGNOSIS — K529 Noninfective gastroenteritis and colitis, unspecified: Secondary | ICD-10-CM | POA: Diagnosis present

## 2015-01-05 DIAGNOSIS — D696 Thrombocytopenia, unspecified: Secondary | ICD-10-CM | POA: Diagnosis present

## 2015-01-05 DIAGNOSIS — Z993 Dependence on wheelchair: Secondary | ICD-10-CM

## 2015-01-05 DIAGNOSIS — Z87891 Personal history of nicotine dependence: Secondary | ICD-10-CM | POA: Diagnosis not present

## 2015-01-05 DIAGNOSIS — I10 Essential (primary) hypertension: Secondary | ICD-10-CM | POA: Diagnosis present

## 2015-01-05 DIAGNOSIS — E1151 Type 2 diabetes mellitus with diabetic peripheral angiopathy without gangrene: Secondary | ICD-10-CM | POA: Diagnosis present

## 2015-01-05 DIAGNOSIS — Z833 Family history of diabetes mellitus: Secondary | ICD-10-CM

## 2015-01-05 DIAGNOSIS — E1165 Type 2 diabetes mellitus with hyperglycemia: Secondary | ICD-10-CM | POA: Diagnosis present

## 2015-01-05 DIAGNOSIS — Z79899 Other long term (current) drug therapy: Secondary | ICD-10-CM

## 2015-01-05 DIAGNOSIS — Z9049 Acquired absence of other specified parts of digestive tract: Secondary | ICD-10-CM

## 2015-01-05 DIAGNOSIS — E039 Hypothyroidism, unspecified: Secondary | ICD-10-CM | POA: Diagnosis present

## 2015-01-05 DIAGNOSIS — C189 Malignant neoplasm of colon, unspecified: Secondary | ICD-10-CM | POA: Diagnosis present

## 2015-01-05 DIAGNOSIS — Z66 Do not resuscitate: Secondary | ICD-10-CM | POA: Diagnosis present

## 2015-01-05 DIAGNOSIS — F0390 Unspecified dementia without behavioral disturbance: Secondary | ICD-10-CM | POA: Diagnosis present

## 2015-01-05 DIAGNOSIS — Z825 Family history of asthma and other chronic lower respiratory diseases: Secondary | ICD-10-CM

## 2015-01-05 DIAGNOSIS — Z794 Long term (current) use of insulin: Secondary | ICD-10-CM | POA: Diagnosis not present

## 2015-01-05 DIAGNOSIS — J449 Chronic obstructive pulmonary disease, unspecified: Secondary | ICD-10-CM | POA: Diagnosis present

## 2015-01-05 DIAGNOSIS — IMO0002 Reserved for concepts with insufficient information to code with codable children: Secondary | ICD-10-CM | POA: Diagnosis present

## 2015-01-05 DIAGNOSIS — E785 Hyperlipidemia, unspecified: Secondary | ICD-10-CM | POA: Diagnosis present

## 2015-01-05 DIAGNOSIS — R509 Fever, unspecified: Secondary | ICD-10-CM

## 2015-01-05 HISTORY — DX: Dependence on wheelchair: Z99.3

## 2015-01-05 HISTORY — DX: Unspecified dementia, unspecified severity, without behavioral disturbance, psychotic disturbance, mood disturbance, and anxiety: F03.90

## 2015-01-05 LAB — URINE MICROSCOPIC-ADD ON

## 2015-01-05 LAB — BASIC METABOLIC PANEL
ANION GAP: 8 (ref 5–15)
BUN: 24 mg/dL — AB (ref 6–20)
CALCIUM: 8.9 mg/dL (ref 8.9–10.3)
CO2: 28 mmol/L (ref 22–32)
Chloride: 106 mmol/L (ref 101–111)
Creatinine, Ser: 1.06 mg/dL (ref 0.61–1.24)
GFR calc Af Amer: >60 mL/min (ref >60)
GFR calc non Af Amer: >60 mL/min (ref >60)
GLUCOSE: 199 mg/dL — AB (ref 65–99)
Potassium: 3.8 mmol/L (ref 3.5–5.1)
Sodium: 142 mmol/L (ref 135–145)

## 2015-01-05 LAB — HEPATIC FUNCTION PANEL
ALBUMIN: 3.4 g/dL — AB (ref 3.5–5.0)
ALT: 12 U/L — ABNORMAL LOW (ref 17–63)
AST: 19 U/L (ref 15–41)
Alkaline Phosphatase: 87 U/L (ref 38–126)
Bilirubin, Direct: 0.2 mg/dL (ref 0.1–0.5)
Indirect Bilirubin: 1 mg/dL — ABNORMAL HIGH (ref 0.3–0.9)
TOTAL PROTEIN: 7.3 g/dL (ref 6.5–8.1)
Total Bilirubin: 1.2 mg/dL (ref 0.3–1.2)

## 2015-01-05 LAB — CBC WITH DIFFERENTIAL/PLATELET
BASOS ABS: 0 10*3/uL (ref 0.0–0.1)
Basophils Relative: 0 % (ref 0–1)
EOS PCT: 0 % (ref 0–5)
Eosinophils Absolute: 0 10*3/uL (ref 0.0–0.7)
HEMATOCRIT: 47.3 % (ref 39.0–52.0)
Hemoglobin: 15.4 g/dL (ref 13.0–17.0)
LYMPHS ABS: 0.9 10*3/uL (ref 0.7–4.0)
LYMPHS PCT: 7 % — AB (ref 12–46)
MCH: 29.2 pg (ref 26.0–34.0)
MCHC: 32.6 g/dL (ref 30.0–36.0)
MCV: 89.6 fL (ref 78.0–100.0)
MONO ABS: 1.1 10*3/uL — AB (ref 0.1–1.0)
MONOS PCT: 8 % (ref 3–12)
NEUTROS ABS: 11.4 10*3/uL — AB (ref 1.7–7.7)
Neutrophils Relative %: 85 % — ABNORMAL HIGH (ref 43–77)
Platelets: 131 10*3/uL — ABNORMAL LOW (ref 150–400)
RBC: 5.28 MIL/uL (ref 4.22–5.81)
RDW: 13.3 % (ref 11.5–15.5)
WBC: 13.3 10*3/uL — ABNORMAL HIGH (ref 4.0–10.5)

## 2015-01-05 LAB — URINALYSIS, ROUTINE W REFLEX MICROSCOPIC
Bilirubin Urine: NEGATIVE
GLUCOSE, UA: 100 mg/dL — AB
Ketones, ur: 15 mg/dL — AB
LEUKOCYTES UA: NEGATIVE
NITRITE: NEGATIVE
PH: 5 (ref 5.0–8.0)
Urobilinogen, UA: 0.2 mg/dL (ref 0.0–1.0)

## 2015-01-05 LAB — LACTIC ACID, PLASMA
LACTIC ACID, VENOUS: 0.8 mmol/L (ref 0.5–2.0)
Lactic Acid, Venous: 1.1 mmol/L (ref 0.5–2.0)

## 2015-01-05 LAB — CBG MONITORING, ED: Glucose-Capillary: 183 mg/dL — ABNORMAL HIGH (ref 65–99)

## 2015-01-05 LAB — GLUCOSE, CAPILLARY: Glucose-Capillary: 217 mg/dL — ABNORMAL HIGH (ref 65–99)

## 2015-01-05 LAB — TROPONIN I: TROPONIN I: 0.03 ng/mL (ref <0.031)

## 2015-01-05 MED ORDER — INSULIN GLARGINE 100 UNIT/ML ~~LOC~~ SOLN
15.0000 [IU] | Freq: Every day | SUBCUTANEOUS | Status: DC
  Administered 2015-01-06 (×2): 15 [IU] via SUBCUTANEOUS
  Filled 2015-01-05 (×3): qty 0.15

## 2015-01-05 MED ORDER — INSULIN ASPART 100 UNIT/ML ~~LOC~~ SOLN
0.0000 [IU] | Freq: Three times a day (TID) | SUBCUTANEOUS | Status: DC
  Administered 2015-01-06: 3 [IU] via SUBCUTANEOUS
  Administered 2015-01-06 (×2): 2 [IU] via SUBCUTANEOUS
  Administered 2015-01-07: 1 [IU] via SUBCUTANEOUS

## 2015-01-05 MED ORDER — DEXTROSE 5 % IV SOLN
500.0000 mg | Freq: Once | INTRAVENOUS | Status: AC
  Administered 2015-01-05: 500 mg via INTRAVENOUS
  Filled 2015-01-05: qty 500

## 2015-01-05 MED ORDER — ACETAMINOPHEN 325 MG PO TABS: 650.0000 mg | ORAL_TABLET | Freq: Four times a day (QID) | ORAL | Status: DC | PRN

## 2015-01-05 MED ORDER — CEFTRIAXONE SODIUM 1 G IJ SOLR
1.0000 g | Freq: Once | INTRAMUSCULAR | Status: AC
  Administered 2015-01-05: 1 g via INTRAVENOUS
  Filled 2015-01-05: qty 10

## 2015-01-05 MED ORDER — MEMANTINE HCL 10 MG PO TABS
10.0000 mg | ORAL_TABLET | Freq: Two times a day (BID) | ORAL | Status: DC
  Administered 2015-01-06 – 2015-01-07 (×4): 10 mg via ORAL
  Filled 2015-01-05 (×4): qty 1

## 2015-01-05 MED ORDER — DONEPEZIL HCL 5 MG PO TABS
10.0000 mg | ORAL_TABLET | Freq: Every day | ORAL | Status: DC
  Administered 2015-01-06 (×2): 10 mg via ORAL
  Filled 2015-01-05 (×2): qty 2

## 2015-01-05 MED ORDER — LEVOTHYROXINE SODIUM 75 MCG PO TABS
175.0000 ug | ORAL_TABLET | Freq: Every day | ORAL | Status: DC
  Administered 2015-01-06 – 2015-01-07 (×2): 175 ug via ORAL
  Filled 2015-01-05 (×4): qty 1

## 2015-01-05 MED ORDER — ENOXAPARIN SODIUM 40 MG/0.4ML ~~LOC~~ SOLN
40.0000 mg | SUBCUTANEOUS | Status: DC
  Administered 2015-01-06: 40 mg via SUBCUTANEOUS
  Filled 2015-01-05 (×2): qty 0.4

## 2015-01-05 MED ORDER — LOPERAMIDE HCL 2 MG PO CAPS
4.0000 mg | ORAL_CAPSULE | Freq: Every day | ORAL | Status: DC
  Administered 2015-01-06 (×2): 4 mg via ORAL
  Filled 2015-01-05 (×2): qty 2

## 2015-01-05 MED ORDER — ACETAMINOPHEN 325 MG PO TABS
650.0000 mg | ORAL_TABLET | Freq: Once | ORAL | Status: AC
  Administered 2015-01-05: 650 mg via ORAL
  Filled 2015-01-05: qty 2

## 2015-01-05 MED ORDER — AMLODIPINE BESYLATE 5 MG PO TABS
5.0000 mg | ORAL_TABLET | Freq: Every day | ORAL | Status: DC
  Administered 2015-01-06 – 2015-01-07 (×2): 5 mg via ORAL
  Filled 2015-01-05 (×2): qty 1

## 2015-01-05 MED ORDER — PRAVASTATIN SODIUM 40 MG PO TABS
40.0000 mg | ORAL_TABLET | Freq: Every morning | ORAL | Status: DC
  Administered 2015-01-06 – 2015-01-07 (×2): 40 mg via ORAL
  Filled 2015-01-05 (×2): qty 1

## 2015-01-05 MED ORDER — ACETAMINOPHEN 650 MG RE SUPP: 650.0000 mg | Freq: Four times a day (QID) | RECTAL | Status: DC | PRN

## 2015-01-05 MED ORDER — GABAPENTIN 100 MG PO CAPS: 100.0000 mg | ORAL_CAPSULE | Freq: Every day | ORAL | Status: DC | PRN

## 2015-01-05 MED ORDER — ASPIRIN EC 81 MG PO TBEC
81.0000 mg | DELAYED_RELEASE_TABLET | Freq: Every day | ORAL | Status: DC
  Administered 2015-01-06 – 2015-01-07 (×2): 81 mg via ORAL
  Filled 2015-01-05 (×2): qty 1

## 2015-01-05 MED ORDER — DEXTROSE 5 % IV SOLN
1.0000 g | INTRAVENOUS | Status: DC
  Administered 2015-01-06: 1 g via INTRAVENOUS
  Filled 2015-01-05 (×2): qty 10

## 2015-01-05 MED ORDER — SODIUM CHLORIDE 0.9 % IV SOLN
INTRAVENOUS | Status: DC
  Administered 2015-01-05 – 2015-01-06 (×2): 75 mL/h via INTRAVENOUS

## 2015-01-05 MED ORDER — ATENOLOL 25 MG PO TABS
50.0000 mg | ORAL_TABLET | Freq: Every day | ORAL | Status: DC
  Administered 2015-01-06: 50 mg via ORAL
  Filled 2015-01-05 (×2): qty 2

## 2015-01-05 MED ORDER — AZITHROMYCIN 500 MG IV SOLR
500.0000 mg | INTRAVENOUS | Status: DC
  Administered 2015-01-06: 500 mg via INTRAVENOUS
  Filled 2015-01-05 (×2): qty 500

## 2015-01-05 NOTE — ED Notes (Signed)
Pt is getting progressively weak.  Having trouble eating and feeding self.  Peter Harvey

## 2015-01-05 NOTE — ED Provider Notes (Signed)
CSN: [id number]     Arrival date & time 01/05/15  1604 History   First MD Initiated Contact with Patient 01/05/15 1644     Chief Complaint  Patient presents with  . Weakness      Patient is a 74 y.o. male presenting with weakness. The history is provided by a caregiver and the patient. The history is limited by the condition of the patient (Hx dementia).  Weakness  Pt was seen at 1705. Per pt's family: Pt has been progressively weaker for the past 3 to 4 days. Pt has been unable to stand to assist for transfers and has not taken PO well for the past 3-4 days. Pt's family feels pt is also "more confused than usual" and questions "if maybe he has a UTI or something." Pt has significant hx of dementia. Denies fevers, no vomiting/diarrhea, no abd pain, no CP/SOB, no cough.    Past Medical History  Diagnosis Date  . HTN (hypertension)   . DM (diabetes mellitus)   . Hypothyroidism   . Hernia   . Dizziness   . AAA (abdominal aortic aneurysm)     Dr. Scot Dock  . Hyperlipidemia   . Leg pain     with walking  . 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 CAD (coronary artery disease)     EF 57% by Myoview 4/12;  cath 5/12: dLM 50%, pLAD 50%, pCFX 50%, OM1 occluded with distal filling with L-L collats, mRCA 70-75%, dRCA 80%;  severe RCA needs PCI but treated medically due to need for colon surgery and plans to possilby perform PCI in future  . PAD (peripheral artery disease)     c/b dry gangrene of toes  . CHF (congestive heart failure)   . COPD (chronic obstructive pulmonary disease)   . Dementia   . Wheelchair bound    Past Surgical History  Procedure Laterality Date  . Hernia repair    . Appendectomy    . Cardiac catheterization    . Colon surgery  12/12/10    r/t colon CA  . Abdominal surgery    . Abdominal aortic aneurysm repair  02/17/11    Open AAA repair and Right Fem-Pop  . Colonoscopy  03/14/2012    Procedure: COLONOSCOPY;  Surgeon: Rogene Houston, MD;   Location: [facility];  Service: Endoscopy;  Laterality: N/A;  345-Ann to notify pt of new time   Family History  Problem Relation Age of Onset  . Diabetes Mother   . Heart disease Mother     heart failure  . Heart attack Mother   . Heart disease Brother   . Other Brother 71    MRSA  . COPD Father   . Heart disease Father     heart failure  . COPD Sister    Social History  Substance Use Topics  . Smoking status: Former Smoker -- 2.00 packs/day for 50 years    Types: Cigarettes    Quit date: 12/11/2010  . Smokeless tobacco: Never Used  . Alcohol Use: No    Review of Systems  Unable to perform ROS: Dementia  Neurological: Positive for weakness.      Allergies  Review of patient's allergies indicates no known allergies.  Home Medications   Prior to Admission medications   Medication Sig Start Date End Date Taking? Authorizing Provider  amLODipine (NORVASC) 5 MG tablet Take 5 mg by mouth daily.   Yes Historical Provider, MD  aspirin EC 81 MG tablet Take 81 mg by mouth daily.     Yes Historical Provider, MD  atenolol (TENORMIN) 50 MG tablet Take 50 mg by mouth daily.     Yes Historical Provider, MD  donepezil (ARICEPT) 10 MG tablet Take 10 mg by mouth at bedtime.    Yes Historical Provider, MD  HUMALOG 100 UNIT/ML injection Inject 3-5 Units into the skin 2 (two) times daily with a meal. Sliding scale 10/27/14  Yes Historical Provider, MD  hydrocortisone cream 1 % Apply 1 application topically daily as needed (for irritation).   Yes Historical Provider, MD  LANTUS 100 UNIT/ML injection Inject 15 Units into the skin at bedtime.  03/14/11  Yes Historical Provider, MD  levothyroxine (SYNTHROID, LEVOTHROID) 175 MCG tablet Take 175 mcg by mouth daily.   Yes Historical Provider, MD  loperamide (IMODIUM) 2 MG capsule Take 4 mg by mouth at bedtime.    Yes Historical Provider, MD  memantine (NAMENDA) 10 MG tablet Take 10 mg by mouth 2 (two) times daily.  12/06/14  Yes Historical  Provider, MD  nitroGLYCERIN (NITROSTAT) 0.4 MG SL tablet Place 1 tablet (0.4 mg total) under the tongue every 5 (five) minutes as needed for chest pain. 07/11/12  Yes Josue Hector, MD  olmesartan (BENICAR) 20 MG tablet Take 20 mg by mouth 2 (two) times daily before a meal.    Yes Historical Provider, MD  pravastatin (PRAVACHOL) 40 MG tablet Take 40 mg by mouth every morning.  07/08/12  Yes Historical Provider, MD  Tolnaftate (ANTI-FUNGAL EX) Apply 1 application topically daily as needed (irritation).   Yes Historical Provider, MD  gabapentin (NEURONTIN) 100 MG capsule Take 100 mg by mouth daily as needed (for neuropathy).  03/27/14   Historical Provider, MD   BP 121/64 mmHg  Pulse 65  Temp(Src) 100.8 F (38.2 C) (Rectal)  Resp 19  Ht 5\' 10"  (1.778 m)  Wt 135 lb (61.236 kg)  BMI 19.37 kg/m2  SpO2 93%   Filed Vitals:   01/05/15 1605 01/05/15 1623 01/05/15 1813  BP: 129/60 137/68 121/64  Pulse: 67 64 65  Temp: 99.8 F (37.7 C) 99.5 F (37.5 C) 100.8 F (38.2 C)  TempSrc: Oral Oral Rectal  Resp: 20 18 19   Height: 5\' 10"  (1.778 m)    Weight: 135 lb (61.236 kg)    SpO2: 93% 91% 93%     18:05:43 Orthostatic Vital Signs TO  Orthostatic Lying  - BP- Lying: 139/69 mmHg ; Pulse- Lying: 63  Orthostatic Sitting - BP- Sitting: 135/79 mmHg ; Pulse- Sitting: 65  Orthostatic Standing at 0 minutes - BP- Standing at 0 minutes: 121/64 mmHg ; Pulse- Standing at 0 minutes: 65       Physical Exam  1710: Physical examination:  Nursing notes reviewed; Vital signs and O2 SAT reviewed;  Constitutional: Well developed, Well nourished, In no acute distress; Head:  Normocephalic, atraumatic; Eyes: EOMI, PERRL, No scleral icterus; ENMT: Mouth and pharynx normal, Mucous membranes dry; Neck: Supple, Full range of motion, No lymphadenopathy; Cardiovascular: Regular rate and rhythm, No gallop; Respiratory: Breath sounds clear & equal bilaterally, No wheezes.  Speaking full sentences with ease, Normal  respiratory effort/excursion; Chest: Nontender, Movement normal; Abdomen: Soft, Nontender, Nondistended, Normal bowel sounds; Genitourinary: No CVA tenderness; Extremities: Pulses normal, No tenderness, No edema, No calf edema or asymmetry.; Neuro: Awake, alert, confused per hx dementia. No facial droop. Speech clear. Grips equal. Moves all extremities on stretcher spontaneously and to command without apparent  gross focal deficits..; Skin: Color normal, Warm, Dry.   ED Course  Procedures (including critical care time) Labs Review   Imaging Review  I have personally reviewed and evaluated these images and lab results as part of my medical decision-making.   EKG Interpretation   Date/Time:  Tuesday January 05 2015 16:25:43 EDT Ventricular Rate:  63 PR Interval:  143 QRS Duration: 136 QT Interval:  432 QTC Calculation: 442 R Axis:   68 Text Interpretation:  Sinus or ectopic atrial rhythm Left bundle branch  block Nonspecific ST and T wave abnormality Inferior leads When compared  with ECG of 02/18/2011 Left bundle branch block is now Present Confirmed  by [facility]  MD, Nunzio Cory [phone]) on 01/05/2015 7:06:32 PM      MDM  MDM Reviewed: previous chart, nursing note and vitals Reviewed previous: labs and ECG Interpretation: labs, ECG and x-ray      Results for orders placed or performed during the hospital encounter of 01/05/15  CBC with Differential  Result Value Ref Range   WBC 13.3 (H) 4.0 - 10.5 K/uL   RBC 5.28 4.22 - 5.81 MIL/uL   Hemoglobin 15.4 13.0 - 17.0 g/dL   HCT 47.3 39.0 - 52.0 %   MCV 89.6 78.0 - 100.0 fL   MCH 29.2 26.0 - 34.0 pg   MCHC 32.6 30.0 - 36.0 g/dL   RDW 13.3 11.5 - 15.5 %   Platelets 131 (L) 150 - 400 K/uL   Neutrophils Relative % 85 (H) 43 - 77 %   Neutro Abs 11.4 (H) 1.7 - 7.7 K/uL   Lymphocytes Relative 7 (L) 12 - 46 %   Lymphs Abs 0.9 0.7 - 4.0 K/uL   Monocytes Relative 8 3 - 12 %   Monocytes Absolute 1.1 (H) 0.1 - 1.0 K/uL   Eosinophils  Relative 0 0 - 5 %   Eosinophils Absolute 0.0 0.0 - 0.7 K/uL   Basophils Relative 0 0 - 1 %   Basophils Absolute 0.0 0.0 - 0.1 K/uL  Basic metabolic panel  Result Value Ref Range   Sodium 142 135 - 145 mmol/L   Potassium 3.8 3.5 - 5.1 mmol/L   Chloride 106 101 - 111 mmol/L   CO2 28 22 - 32 mmol/L   Glucose, Bld 199 (H) 65 - 99 mg/dL   BUN 24 (H) 6 - 20 mg/dL   Creatinine, Ser 1.06 0.61 - 1.24 mg/dL   Calcium 8.9 8.9 - 10.3 mg/dL   GFR calc non Af Amer >60 >60 mL/min   GFR calc Af Amer >60 >60 mL/min   Anion gap 8 5 - 15  Urinalysis, Routine w reflex microscopic (not at [facility])  Result Value Ref Range   Color, Urine YELLOW YELLOW   APPearance CLEAR CLEAR   Specific Gravity, Urine >1.030 (H) 1.005 - 1.030   pH 5.0 5.0 - 8.0   Glucose, UA 100 (A) NEGATIVE mg/dL   Hgb urine dipstick LARGE (A) NEGATIVE   Bilirubin Urine NEGATIVE NEGATIVE   Ketones, ur 15 (A) NEGATIVE mg/dL   Protein, ur TRACE (A) NEGATIVE mg/dL   Urobilinogen, UA 0.2 0.0 - 1.0 mg/dL   Nitrite NEGATIVE NEGATIVE   Leukocytes, UA NEGATIVE NEGATIVE  Troponin I  Result Value Ref Range   Troponin I 0.03 <0.031 ng/mL  Lactic acid, plasma  Result Value Ref Range   Lactic Acid, Venous 0.8 0.5 - 2.0 mmol/L  Hepatic function panel  Result Value Ref Range   Total Protein 7.3 6.5 -  8.1 g/dL   Albumin 3.4 (L) 3.5 - 5.0 g/dL   AST 19 15 - 41 U/L   ALT 12 (L) 17 - 63 U/L   Alkaline Phosphatase 87 38 - 126 U/L   Total Bilirubin 1.2 0.3 - 1.2 mg/dL   Bilirubin, Direct 0.2 0.1 - 0.5 mg/dL   Indirect Bilirubin 1.0 (H) 0.3 - 0.9 mg/dL  Urine microscopic-add on  Result Value Ref Range   Squamous Epithelial / LPF FEW (A) RARE   WBC, UA 0-2 <3 WBC/hpf   RBC / HPF 21-50 <3 RBC/hpf   Bacteria, UA RARE RARE  CBG monitoring, ED  Result Value Ref Range   Glucose-Capillary 183 (H) 65 - 99 mg/dL   Dg Chest Portable 1 View 01/05/2015   CLINICAL DATA:  Progressively getting week. Having trouble eating and feeding self.  EXAM:  PORTABLE CHEST - 1 VIEW  COMPARISON:  December 01, 2012  FINDINGS: The heart size and mediastinal contours are stable. The heart size is mildly enlarged. The aorta is tortuous. There is mild patchy opacity of left lung base. There is no pulmonary edema or pleural effusion. The visualized skeletal structures are stable.  IMPRESSION: Mild patchy opacity of left lung base, developing pneumonia is not excluded.   Electronically Signed   By: Abelardo Diesel M.D.   On: 01/05/2015 16:38    1930:  Pt unable to stand for orthostatic VS without heavy assist x3 (not pt's baseline per family). APAP given for fever. IV abx given for CAP. Dx and testing d/w pt and family.  Questions answered.  Verb understanding, agreeable to admit.   T/C to [facility] Dr. Darrick Meigs, case discussed, including:  HPI, pertinent PM/SHx, VS/PE, dx testing, ED course and treatment:  Agreeable to admit, requests to write temporary orders, obtain medical bed to team APAdmits.   Francine Graven, DO 01/10/15 [phone]

## 2015-01-05 NOTE — H&P (Signed)
PCP:   Leonides Grills, MD   Chief Complaint:  Generalized weakness  HPI:  74 year old male who  has a past medical history of HTN (hypertension); DM (diabetes mellitus); Hypothyroidism; Hernia; Dizziness; AAA (abdominal aortic aneurysm); Hyperlipidemia; Leg pain; Colon cancer; CAD (coronary artery disease); PAD (peripheral artery disease); CHF (congestive heart failure); COPD (chronic obstructive pulmonary disease); Dementia; and Wheelchair bound. Today presents to the hospital with chief complaint of progressive weakness over the past 3-4 days. As per patient's son patient usually stands up from the bed and pivots to sit on the wheelchair. As per caregiver patient was unable to get up from the bed and has been more lethargic. Poor by mouth intake. Patient was brought to the hospital for further evaluation. He has a history of chronic diarrhea. Has a history of dementia. Patient is able to provide good history. He denies chest pain, no shortness of breath. No nausea or vomiting. Says that he feels weakness in the lower extremities and was unable to stand. UA showed 21-50 RBCs per high-power field. Chest x-ray shows mild patchy opacity of left lung base developing pneumonia not excluded.   Allergies:  No Known Allergies    Past Medical History  Diagnosis Date  . HTN (hypertension)   . DM (diabetes mellitus)   . Hypothyroidism   . Hernia   . Dizziness   . AAA (abdominal aortic aneurysm)     Dr. Scot Dock  . Hyperlipidemia   . Leg pain     with walking  . 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 CAD (coronary artery disease)     EF 57% by Myoview 4/12;  cath 5/12: dLM 50%, pLAD 50%, pCFX 50%, OM1 occluded with distal filling with L-L collats, mRCA 70-75%, dRCA 80%;  severe RCA needs PCI but treated medically due to need for colon surgery and plans to possilby perform PCI in future  . PAD (peripheral artery disease)     c/b dry gangrene of toes  . CHF  (congestive heart failure)   . COPD (chronic obstructive pulmonary disease)   . Dementia   . Wheelchair bound     Past Surgical History  Procedure Laterality Date  . Hernia repair    . Appendectomy    . Cardiac catheterization    . Colon surgery  12/12/10    r/t colon CA  . Abdominal surgery    . Abdominal aortic aneurysm repair  02/17/11    Open AAA repair and Right Fem-Pop  . Colonoscopy  03/14/2012    Procedure: COLONOSCOPY;  Surgeon: Rogene Houston, MD;  Location: [facility];  Service: Endoscopy;  Laterality: N/A;  345-Ann to notify pt of new time    Prior to Admission medications   Medication Sig Start Date End Date Taking? Authorizing Provider  amLODipine (NORVASC) 5 MG tablet Take 5 mg by mouth daily.   Yes Historical Provider, MD  aspirin EC 81 MG tablet Take 81 mg by mouth daily.     Yes Historical Provider, MD  atenolol (TENORMIN) 50 MG tablet Take 50 mg by mouth daily.     Yes Historical Provider, MD  donepezil (ARICEPT) 10 MG tablet Take 10 mg by mouth at bedtime.    Yes Historical Provider, MD  HUMALOG 100 UNIT/ML injection Inject 3-5 Units into the skin 2 (two) times daily with a meal. Sliding scale 10/27/14  Yes Historical Provider, MD  hydrocortisone cream 1 % Apply 1 application topically daily  as needed (for irritation).   Yes Historical Provider, MD  LANTUS 100 UNIT/ML injection Inject 15 Units into the skin at bedtime.  03/14/11  Yes Historical Provider, MD  levothyroxine (SYNTHROID, LEVOTHROID) 175 MCG tablet Take 175 mcg by mouth daily.   Yes Historical Provider, MD  loperamide (IMODIUM) 2 MG capsule Take 4 mg by mouth at bedtime.    Yes Historical Provider, MD  memantine (NAMENDA) 10 MG tablet Take 10 mg by mouth 2 (two) times daily.  12/06/14  Yes Historical Provider, MD  nitroGLYCERIN (NITROSTAT) 0.4 MG SL tablet Place 1 tablet (0.4 mg total) under the tongue every 5 (five) minutes as needed for chest pain. 07/11/12  Yes Josue Hector, MD  olmesartan (BENICAR)  20 MG tablet Take 20 mg by mouth 2 (two) times daily before a meal.    Yes Historical Provider, MD  pravastatin (PRAVACHOL) 40 MG tablet Take 40 mg by mouth every morning.  07/08/12  Yes Historical Provider, MD  Tolnaftate (ANTI-FUNGAL EX) Apply 1 application topically daily as needed (irritation).   Yes Historical Provider, MD  gabapentin (NEURONTIN) 100 MG capsule Take 100 mg by mouth daily as needed (for neuropathy).  03/27/14   Historical Provider, MD    Social History:  reports that he quit smoking about 4 years ago. His smoking use included Cigarettes. He has a 100 pack-year smoking history. He has never used smokeless tobacco. He reports that he does not drink alcohol or use illicit drugs.  Family History  Problem Relation Age of Onset  . Diabetes Mother   . Heart disease Mother     heart failure  . Heart attack Mother   . Heart disease Brother   . Other Brother 110    MRSA  . COPD Father   . Heart disease Father     heart failure  . COPD Sister     Danley Danker Weights   01/05/15 1605  Weight: 61.236 kg (135 lb)    All the positives are listed in BOLD  Review of Systems:  HEENT: Headache, blurred vision, runny nose, sore throat Neck: Hypothyroidism, hyperthyroidism,,lymphadenopathy Chest : Shortness of breath, history of COPD, Asthma Heart : Chest pain, history of coronary arterey disease GI:  Nausea, vomiting, diarrhea, constipation, GERD GU: Dysuria, urgency, frequency of urination, hematuria Neuro: Stroke, seizures, syncope Psych: Depression, anxiety, hallucinations   Physical Exam: Blood pressure 121/64, pulse 65, temperature 100.8 F (38.2 C), temperature source Rectal, resp. rate 19, height 5\' 10"  (1.778 m), weight 61.236 kg (135 lb), SpO2 93 %. Constitutional:   Patient is a well-developed and well-nourished male in no acute distress and cooperative with exam. Head: Normocephalic and atraumatic Mouth: Mucus membranes moist Eyes: PERRL, EOMI, conjunctivae  normal Neck: Supple, No Thyromegaly Cardiovascular: RRR, S1 normal, S2 normal Pulmonary/Chest: CTAB, no wheezes, rales, or rhonchi Abdominal: Soft. Non-tender, non-distended, bowel sounds are normal, no masses, organomegaly, or guarding present.  Neurological: A&O x3, Strength is normal and symmetric bilaterally, cranial nerve II-XII are grossly intact, no focal motor deficit, sensory intact to light touch bilaterally.  Extremities : No Cyanosis, Clubbing or Edema  Labs on Admission:  Basic Metabolic Panel:  Recent Labs Lab 01/05/15 1627  NA 142  K 3.8  CL 106  CO2 28  GLUCOSE 199*  BUN 24*  CREATININE 1.06  CALCIUM 8.9   Liver Function Tests:  Recent Labs Lab 01/05/15 1629  AST 19  ALT 12*  ALKPHOS 87  BILITOT 1.2  PROT 7.3  ALBUMIN 3.4*  CBC:  Recent Labs Lab 01/05/15 1627  WBC 13.3*  NEUTROABS 11.4*  HGB 15.4  HCT 47.3  MCV 89.6  PLT 131*   Cardiac Enzymes:  Recent Labs Lab 01/05/15 1629  TROPONINI 0.03     CBG:  Recent Labs Lab 01/05/15 1635  GLUCAP 183*    Radiological Exams on Admission: Dg Chest Portable 1 View  01/05/2015   CLINICAL DATA:  Progressively getting week. Having trouble eating and feeding self.  EXAM: PORTABLE CHEST - 1 VIEW  COMPARISON:  December 01, 2012  FINDINGS: The heart size and mediastinal contours are stable. The heart size is mildly enlarged. The aorta is tortuous. There is mild patchy opacity of left lung base. There is no pulmonary edema or pleural effusion. The visualized skeletal structures are stable.  IMPRESSION: Mild patchy opacity of left lung base, developing pneumonia is not excluded.   Electronically Signed   By: Abelardo Diesel M.D.   On: 01/05/2015 16:38    EKG: Independently reviewed. Sinus rhythm   Assessment/Plan Active Problems:   HTN (hypertension)   Colon cancer   CAP (community acquired pneumonia)   Chronic diarrhea   Diabetes type 2, uncontrolled   Community acquired pneumonia  Community  acquired pneumonia We'll admit the patient, start IV Rocephin and Zithromax Check Legionella urine antigen, urinary strep pneumo antigen. Follow blood cultures 2, CBC in a.m.  Diabetes mellitus Patient takes Lantus at home, continue Lantus. Start sliding scale insulin with NovoLog  Chronic diarrhea Patient has history of chronic diarrhea, continue Imodium 4 mg daily at bedtime  Hypertension Patient takes Benicar at home, will hold Benicar at this time and continue with atenolol 50 mg by mouth daily. Also continue amlodipine 5 mg by mouth daily.  Hypothyroidism Patient takes Synthroid at home. Continue Synthroid 175 g by mouth daily.   Code status: DO NOT RESUSCITATE  Family discussion: Admission, patients condition and plan of care including tests being ordered have been discussed with the patient and his son at bedside* who indicate understanding and agree with the plan and Code Status.   Time Spent on Admission: 60 minutes*  Hina Gupta S [facility] Pager: [phone] 01/05/2015, 8:49 PM  If 7PM-7AM, please contact night-coverage  [email or web address]  Password [email or web address]

## 2015-01-05 NOTE — ED Notes (Signed)
Patient stood with 3 person assist. Leaning very heavily on nursing staff and bed. Did give effort to stand. Patient c/o dizziness upon standing.

## 2015-01-05 NOTE — Progress Notes (Signed)
ANTIBIOTIC CONSULT NOTE Pharmacy Consult for Rocephin Indication: pneumonia  No Known Allergies  Patient Measurements: Height: 5\' 10"  (177.8 cm) Weight: 135 lb (61.236 kg) IBW/kg (Calculated) : 73  Vital Signs: Temp: 100.8 F (38.2 C) (08/30 1813) Temp Source: Rectal (08/30 1813) BP: 157/80 mmHg (08/30 2100) Pulse Rate: 57 (08/30 2100) Intake/Output from previous day:   Intake/Output from this shift:    Labs:  Recent Labs  01/05/15 1627  WBC 13.3*  HGB 15.4  PLT 131*  CREATININE 1.06   Estimated Creatinine Clearance: 52.9 mL/min (by C-G formula based on Cr of 1.06). No results for input(s): VANCOTROUGH, VANCOPEAK, VANCORANDOM, GENTTROUGH, GENTPEAK, GENTRANDOM, TOBRATROUGH, TOBRAPEAK, TOBRARND, AMIKACINPEAK, AMIKACINTROU, AMIKACIN in the last 72 hours.   Microbiology: Recent Results (from the past 720 hour(s))  Culture, blood (routine x 2)     Status: None (Preliminary result)   Collection Time: 01/05/15  9:08 PM  Result Value Ref Range Status   Specimen Description BLOOD RIGHT FOREARM  Final   Special Requests BOTTLES DRAWN AEROBIC AND ANAEROBIC 6CC  Final   Culture PENDING  Incomplete   Report Status PENDING  Incomplete  Culture, blood (routine x 2)     Status: None (Preliminary result)   Collection Time: 01/05/15  9:20 PM  Result Value Ref Range Status   Specimen Description BLOOD RIGHT HAND  Final   Special Requests BOTTLES DRAWN AEROBIC AND ANAEROBIC 6CC  Final   Culture PENDING  Incomplete   Report Status PENDING  Incomplete    Anti-infectives    Start     Dose/Rate Route Frequency Ordered Stop   01/06/15 2100  azithromycin (ZITHROMAX) 500 mg in dextrose 5 % 250 mL IVPB     500 mg 250 mL/hr over 60 Minutes Intravenous Every 24 hours 01/05/15 2207     01/06/15 2100  cefTRIAXone (ROCEPHIN) 1 g in dextrose 5 % 50 mL IVPB     1 g 100 mL/hr over 30 Minutes Intravenous Every 24 hours 01/05/15 2219     01/05/15 1930  cefTRIAXone (ROCEPHIN) 1 g in dextrose 5  % 50 mL IVPB     1 g 100 mL/hr over 30 Minutes Intravenous  Once 01/05/15 1920 01/05/15 2049   01/05/15 1930  azithromycin (ZITHROMAX) 500 mg in dextrose 5 % 250 mL IVPB     500 mg 250 mL/hr over 60 Minutes Intravenous  Once 01/05/15 1920 01/05/15 2151      Assessment: Okay for Protocol, no pharmacokinetic or renal monitoring needed.  Goal of Therapy:  Eradicate infection.   Plan:  Rocephin 1gm IV every 24 hours. Sign off.  Biagio Quint R 01/05/2015,10:21 PM

## 2015-01-06 DIAGNOSIS — J189 Pneumonia, unspecified organism: Principal | ICD-10-CM

## 2015-01-06 LAB — COMPREHENSIVE METABOLIC PANEL
ALK PHOS: 75 U/L (ref 38–126)
ALT: 11 U/L — AB (ref 17–63)
AST: 14 U/L — AB (ref 15–41)
Albumin: 2.9 g/dL — ABNORMAL LOW (ref 3.5–5.0)
Anion gap: 6 (ref 5–15)
BILIRUBIN TOTAL: 0.8 mg/dL (ref 0.3–1.2)
BUN: 18 mg/dL (ref 6–20)
CALCIUM: 8.3 mg/dL — AB (ref 8.9–10.3)
CO2: 30 mmol/L (ref 22–32)
CREATININE: 0.95 mg/dL (ref 0.61–1.24)
Chloride: 104 mmol/L (ref 101–111)
Glucose, Bld: 183 mg/dL — ABNORMAL HIGH (ref 65–99)
Potassium: 3.8 mmol/L (ref 3.5–5.1)
Sodium: 140 mmol/L (ref 135–145)
TOTAL PROTEIN: 6.6 g/dL (ref 6.5–8.1)

## 2015-01-06 LAB — CBC
HEMATOCRIT: 44.7 % (ref 39.0–52.0)
Hemoglobin: 14.3 g/dL (ref 13.0–17.0)
MCH: 28.8 pg (ref 26.0–34.0)
MCHC: 32 g/dL (ref 30.0–36.0)
MCV: 89.9 fL (ref 78.0–100.0)
PLATELETS: 111 10*3/uL — AB (ref 150–400)
RBC: 4.97 MIL/uL (ref 4.22–5.81)
RDW: 13.4 % (ref 11.5–15.5)
WBC: 8.2 10*3/uL (ref 4.0–10.5)

## 2015-01-06 LAB — GLUCOSE, CAPILLARY
GLUCOSE-CAPILLARY: 165 mg/dL — AB (ref 65–99)
GLUCOSE-CAPILLARY: 230 mg/dL — AB (ref 65–99)
Glucose-Capillary: 213 mg/dL — ABNORMAL HIGH (ref 65–99)
Glucose-Capillary: 153 mg/dL — ABNORMAL HIGH (ref 65–99)

## 2015-01-06 LAB — URINE CULTURE: CULTURE: NO GROWTH

## 2015-01-06 NOTE — Progress Notes (Signed)
PROGRESS NOTE  Peter Harvey GXQ:[id number] DOB: [birth year] DOA: 01/05/2015 PCP: Leonides Grills, MD  Summary: 30 yom PMH dementia presented with complaints of progressive weakness, unable to get up from bed, more lethargic, poor oral intake. Chest x-ray revealed mild patchy opacity of left lung base developing pneumonia not excluded. Admitted for CAP  Assessment/Plan: 1. CAP. No hypoxia. Revealed on CXR.  IV Rocephin and Zithromax have been started. Blood and urine cultures pending. Pt is afebrile with no leukocytosis and negative UA. Lactic acid was normal. 2. Chronic diarrhea. Continue 4 mg Imodium daily at bedtime 3. Intermittent thrombocytopenia, seen in past as well. Will monitor.  4. DM Type 2 with associated PAD. Stable, continue current treatments. 5. HTN, Stable. 6. Hypothyroidism. Continue Synthroid.  7. Dementia, back to baseline.    Overall doing better  Continue abx, discontinue telemetry.  If continues to improve, anticipate discharge 9/1.  Code Status: DNR DVT prophylaxis: Lovenox Family Communication: 24 hour care giver bedside and son.  Disposition Plan: Anticipate discharge home within 24 hours.   Murray Hodgkins, MD  [facility]  Pager [phone] If 7PM-7AM, please contact night-coverage at [email or web address], password [email or web address] 01/06/2015, 7:45 AM  LOS: 1 day   Consultants:    Procedures:    Antibiotics:  Rocephin 8/31>>  Zithromax 8/31>>  HPI/Subjective: Feels so much better, able to eat with difficulty. No SOB, nausea, vomiting  Caregiver reports once he received the abx he started to return back to baseline.   Objective: Filed Vitals:   01/05/15 1813 01/05/15 2100 01/05/15 2224 01/06/15 0531  BP: 121/64 157/80 147/72 164/125  Pulse: 65 57 51 57  Temp: 100.8 F (38.2 C)  98.2 F (36.8 C) 98.5 F (36.9 C)  TempSrc: Rectal  Oral Oral  Resp: 19 17 20 20   Height:   5\' 10"  (1.778 m)   Weight:      SpO2: 93% 94% 98% 96%     Intake/Output Summary (Last 24 hours) at 01/06/15 0745 Last data filed at 01/05/15 1900  Gross per 24 hour  Intake      0 ml  Output      0 ml  Net      0 ml     Filed Weights   01/05/15 1605  Weight: 61.236 kg (135 lb)    Exam:  Afebrile. General:  Appears calm and comfortable. Eyes: PERRL, normal lids, irises & conjunctiva ENT: grossly normal hearing, lips & tongue Cardiovascular: Bradycardia regular rhythm, no m/r/g. No LE edema. Telemetry: SB Respiratory: CTA bilaterally, no w/r/r. Normal respiratory effort. Abdomen: soft, ntnd Musculoskeletal: grossly normal tone BUE/BLE Psychiatric: grossly normal mood and affect. Dysarthric. Neurologic: grossly non-focal.  New data reviewed:  Blood sugar stable   CBC and BMP unremarkable  WBC normalized  Platelet count slightly lower, 111.  EKG independently reviewed SR, LBBB  UA negative   Pertinent data since admission:  CXR revealed mild patchy opacity of left lung base developing pneumonia not excluded.  Pending data:  UC  BC  Scheduled Meds: . amLODipine  5 mg Oral Daily  . aspirin EC  81 mg Oral Daily  . atenolol  50 mg Oral Daily  . azithromycin  500 mg Intravenous Q24H  . cefTRIAXone (ROCEPHIN)  IV  1 g Intravenous Q24H  . donepezil  10 mg Oral QHS  . enoxaparin (LOVENOX) injection  40 mg Subcutaneous Q24H  . insulin aspart  0-9 Units Subcutaneous TID WC  . insulin glargine  15 Units Subcutaneous QHS  .  levothyroxine  175 mcg Oral QAC breakfast  . loperamide  4 mg Oral QHS  . memantine  10 mg Oral BID  . pravastatin  40 mg Oral q morning - 10a   Continuous Infusions: . sodium chloride 75 mL/hr (01/06/15 0737)    Principal Problem:   CAP (community acquired pneumonia) Active Problems:   HTN (hypertension)   Colon cancer   Chronic diarrhea   Diabetes type 2, uncontrolled   Time spent 30 minutes  I, Peter Harvey, acting as scribe, recorded this note contemporaneously in the  presence of Dr. Melene Plan. Sarajane Jews, M.D. on 01/06/2015 .   I have reviewed the above documentation for accuracy and completeness, and I agree with the above. Murray Hodgkins, MD

## 2015-01-06 NOTE — Clinical Documentation Improvement (Signed)
Family Medicine  Can the diagnosis of CHF be further specified?    Acuity - Acute, Chronic, Acute on Chronic   Type - Systolic, Diastolic, Systolic and Diastolic  Other  Clinically Undetermined   Supporting Information: *Per H&P: "CHF (congestive heart failure)" and "EF 57% by Myoview 4/12"   Please exercise your independent, professional judgment when responding. A specific answer is not anticipated or expected.   Thank You,  Franky Macho, RN [place]

## 2015-01-06 NOTE — Care Management Note (Signed)
Case Management Note  Patient Details  Name: Peter Harvey MRN: [id number] Date of Birth: 15-Jul-1940  Subjective/Objective:                  Pt admitted from home with pneumonia. Pt lives at home with 24 hour caregivers. Pts son is very active in the care of the pt. Pt will return home at discharge. Pt has a wheelchair that he pivots to get into. Pts son stated that pt may need hospital bed at discharge. Son also stated that pts home has been converted to a handicap accessible home.  Action/Plan: Will continue to follow for discharge planning needs. Son will notify CM if they desire the hospital bed at discharge.  Expected Discharge Date:                  Expected Discharge Plan:  Home/Self Care  In-House Referral:  NA  Discharge planning Services  CM Consult  Post Acute Care Choice:  Durable Medical Equipment Choice offered to:  Adult Children, Patient  DME Arranged:    DME Agency:     HH Arranged:    HH Agency:     Status of Service:  In process, will continue to follow  Medicare Important Message Given:    Date Medicare IM Given:    Medicare IM give by:    Date Additional Medicare IM Given:    Additional Medicare Important Message give by:     If discussed at [place] of Stay Meetings, dates discussed:    Additional Comments:  Joylene Draft, RN 01/06/2015, 10:54 AM

## 2015-01-07 DIAGNOSIS — J189 Pneumonia, unspecified organism: Secondary | ICD-10-CM | POA: Diagnosis not present

## 2015-01-07 LAB — HEMOGLOBIN A1C
HEMOGLOBIN A1C: 8 % — AB (ref 4.8–5.6)
MEAN PLASMA GLUCOSE: 183 mg/dL

## 2015-01-07 LAB — GLUCOSE, CAPILLARY: GLUCOSE-CAPILLARY: 135 mg/dL — AB (ref 65–99)

## 2015-01-07 MED ORDER — INFLUENZA VAC SPLIT QUAD 0.5 ML IM SUSY
0.5000 mL | PREFILLED_SYRINGE | INTRAMUSCULAR | Status: DC
  Filled 2015-01-07: qty 0.5

## 2015-01-07 MED ORDER — AZITHROMYCIN 250 MG PO TABS: 250.0000 mg | ORAL_TABLET | Freq: Every day | ORAL | Status: DC

## 2015-01-07 MED ORDER — PNEUMOCOCCAL VAC POLYVALENT 25 MCG/0.5ML IJ INJ: 0.5000 mL | INJECTION | INTRAMUSCULAR | Status: DC | PRN

## 2015-01-07 MED ORDER — PNEUMOCOCCAL VAC POLYVALENT 25 MCG/0.5ML IJ INJ
0.5000 mL | INJECTION | Freq: Once | INTRAMUSCULAR | Status: AC
  Administered 2015-01-07: 0.5 mL via INTRAMUSCULAR
  Filled 2015-01-07: qty 0.5

## 2015-01-07 MED ORDER — CEFUROXIME AXETIL 250 MG PO TABS: 500.0000 mg | ORAL_TABLET | Freq: Two times a day (BID) | ORAL | Status: DC

## 2015-01-07 MED ORDER — PNEUMOCOCCAL VAC POLYVALENT 25 MCG/0.5ML IJ INJ
0.5000 mL | INJECTION | INTRAMUSCULAR | Status: DC
  Filled 2015-01-07: qty 0.5

## 2015-01-07 MED ORDER — INFLUENZA VAC SPLIT QUAD 0.5 ML IM SUSY
0.5000 mL | PREFILLED_SYRINGE | Freq: Once | INTRAMUSCULAR | Status: AC
  Administered 2015-01-07: 0.5 mL via INTRAMUSCULAR

## 2015-01-07 MED ORDER — CEFUROXIME AXETIL 500 MG PO TABS: 500.0000 mg | ORAL_TABLET | Freq: Two times a day (BID) | ORAL | Status: DC

## 2015-01-07 MED ORDER — INFLUENZA VAC SPLIT QUAD 0.5 ML IM SUSY: 0.5000 mL | PREFILLED_SYRINGE | INTRAMUSCULAR | Status: DC | PRN

## 2015-01-07 NOTE — Care Management Note (Signed)
Case Management Note  Patient Details  Name: Peter Harvey MRN: [id number] Date of Birth: [birth year]  Expected Discharge Date:                  Expected Discharge Plan:  Home/Self Care  In-House Referral:  NA  Discharge planning Services  CM Consult  Post Acute Care Choice:  Durable Medical Equipment Choice offered to:  Adult Children, Patient  DME Arranged:  Hospital bed DME Agency:  [place]:    [place]:     Status of Service:  Completed, signed off  Medicare Important Message Given:    Date Medicare IM Given:    Medicare IM give by:    Date Additional Medicare IM Given:    Additional Medicare Important Message give by:     If discussed at [place] of Stay Meetings, dates discussed:    Additional Comments: Pt discharging home today with self/family care. Pt ordered hospital bed and would like it through [facility]. Romualdo Bolk, of [facility] made aware and will obtain pt info from chart. Bed will be delivered to pt's home. No further CM needs.  Sherald Barge, RN 01/07/2015, 2:08 PM

## 2015-01-07 NOTE — Progress Notes (Signed)
Patients heart rate 56 BP 127/57. Dr. Sarajane Jews notified. Will give norvasc and hold atenolol for now.

## 2015-01-07 NOTE — Progress Notes (Signed)
PROGRESS NOTE  [facility] BWL:[id number] DOB: [birth year] DOA: 01/05/2015 PCP: Leonides Grills, MD  Summary: 53 yom PMH dementia presented with complaints of progressive weakness, unable to get up from bed, more lethargic, poor oral intake. Chest x-ray revealed mild patchy opacity of left lung base developing pneumonia not excluded. Admitted for CAP  Assessment/Plan: 1. CAP, clinically resolved. No hypoxia. Revealed on CXR. Blood cultures showed no growth in the last 2 days. Pt remained afebrile. 2. Chronic diarrhea. Continue 4 mg Imodium daily at bedtime 3. Intermittent thrombocytopenia, seen in past as well.  4. DM Type 2 with associated PAD. Stable, continue current treatments. 5. HTN, Stable. 6. Hypothyroidism. Continue Synthroid.  7. Dementia, back to baseline.   Overall much improved.  Follow up with PCP as scheduled on 9/8.  Anticipate discharge home today.   Code Status: DNR DVT prophylaxis: Lovenox Family Communication: Son bedside, reviewed labs, imaging results and discharge medications. Disposition Plan: Discharge home today   Murray Hodgkins, MD  [facility]  Pager [phone] If 7PM-7AM, please contact night-coverage at [email or web address], password [email or web address] 01/07/2015, 8:16 AM  LOS: 2 days   Consultants:    Procedures:    Antibiotics:  Rocephin 8/30>>8/31  Zithromax 8/30>>9/3  Ceftin 9/1>>9/5  HPI/Subjective: Feeling better today. No reports of chest pain, shortness of breath, n/v/d, or abd pain.  Objective: Filed Vitals:   01/06/15 0531 01/06/15 1402 01/06/15 2058 01/07/15 0533  BP: 164/125 128/61 146/69 136/72  Pulse: 57 49 55 68  Temp: 98.5 F (36.9 C) 98.2 F (36.8 C) 97.5 F (36.4 C) 99 F (37.2 C)  TempSrc: Oral  Axillary Oral  Resp: 20 18 20 18   Height:      Weight:      SpO2: 96% 100% 96% 100%    Intake/Output Summary (Last 24 hours) at 01/07/15 0816 Last data filed at 01/06/15 2101  Gross per 24 hour  Intake    480 ml   Output    750 ml  Net   -270 ml     Filed Weights   01/05/15 1605  Weight: 61.236 kg (135 lb)    Exam:  Afebrile. General:  Appears calm and comfortable. Eyes: PERRL, normal lids, irises & conjunctiva ENT: grossly normal hearing, lips & tongue Cardiovascular: RRR, no m/r/g. No LE edema. Respiratory: No w/r/r. Normal respiratory effort. Faint crackles at the posterior bases.  Abdomen: soft, ntnd Musculoskeletal: grossly normal tone BUE/BLE Psychiatric: grossly normal mood and affect. Speech clear.  New data reviewed:  UC no growth, final.  Pertinent data since admission:  CXR revealed mild patchy opacity of left lung base developing pneumonia not excluded.  Pending data:    Scheduled Meds: . amLODipine  5 mg Oral Daily  . aspirin EC  81 mg Oral Daily  . atenolol  50 mg Oral Daily  . azithromycin  500 mg Intravenous Q24H  . cefTRIAXone (ROCEPHIN)  IV  1 g Intravenous Q24H  . donepezil  10 mg Oral QHS  . enoxaparin (LOVENOX) injection  40 mg Subcutaneous Q24H  . insulin aspart  0-9 Units Subcutaneous TID WC  . insulin glargine  15 Units Subcutaneous QHS  . levothyroxine  175 mcg Oral QAC breakfast  . loperamide  4 mg Oral QHS  . memantine  10 mg Oral BID  . pravastatin  40 mg Oral q morning - 10a   Continuous Infusions:    Principal Problem:   CAP (community acquired pneumonia) Active Problems:   HTN (hypertension)   Colon  cancer   Chronic diarrhea   Diabetes type 2, uncontrolled   I, Jessica D. Leonie Green, acting as scribe, recorded this note contemporaneously in the presence of Dr. Melene Plan. Sarajane Jews, M.D. on 01/07/2015 .   I have reviewed the above documentation for accuracy and completeness, and I agree with the above. Murray Hodgkins, MD

## 2015-01-07 NOTE — Care Management (Signed)
Patient requires frequent re-positioning of the body in ways that cannot be achieved with an ordinary bed or wedge pillow, to eliminate pain, reduce pressure, and the head of the bed to be elevated more than 30 degrees most of the time due to CHF 

## 2015-01-07 NOTE — Progress Notes (Signed)
Inpatient Diabetes Program Recommendations  AACE/ADA: New Consensus Statement on Inpatient Glycemic Control (2013)  Target Ranges:  Prepandial:   less than 140 mg/dL      Peak postprandial:   less than 180 mg/dL (1-2 hours)      Critically ill patients:  140 - 180 mg/dL   Results for DOMINGO, FUSON (MRN [id number]) as of 01/07/2015 08:26  Ref. Range 01/06/2015 07:48 01/06/2015 11:58 01/06/2015 16:14 01/06/2015 21:01 01/07/2015 07:23  Glucose-Capillary Latest Ref Range: 65-99 mg/dL 165 (H) 213 (H) 153 (H) 230 (H) 135 (H)   Current orders for Inpatient glycemic control: Novolog 0-9 units TID with meals, Lantus 15 units QHS  Inpatient Diabetes Program Recommendations Insulin - Meal Coverage: Please consider ordering Novolog 3 units TID with meals for meal coverage.  Thanks, Barnie Alderman, RN, MSN, CCRN, CDE Diabetes Coordinator Inpatient Diabetes Program [phone] (Team Pager from [place] to [place]) [phone] ([facility] office) [phone] [facility] office) [phone] [facility] office)

## 2015-01-07 NOTE — Care Management Important Message (Signed)
Important Message  Patient Details  Name: Peter Harvey MRN: [id number] Date of Birth: 04-10-1941   Medicare Important Message Given:  N/A - LOS <3 / Initial given by admissions    Sherald Barge, RN 01/07/2015, 2:10 PM

## 2015-01-07 NOTE — Progress Notes (Signed)
Patient loss IV access, anticipated discharge is today 01/07/15. There are no additional IV medications due, patients son requested [place] to leave it out if the patient is going home today. Will pass it on to the day shift RN to start new IV if patient is not sent home.

## 2015-01-07 NOTE — Discharge Summary (Signed)
Physician Discharge Summary  Peter Harvey INO:[id number] DOB: [birth year] DOA: 01/05/2015  PCP: Leonides Grills, MD  Admit date: 01/05/2015 Discharge date: 01/07/2015  Recommendations for Outpatient Follow-up:   Follow up with PCP as scheduled on 9/8.  Follow-up resolution of pneumonia. Continue Zithromax until 9/3 and Ceftin until 9/5.  Consider CBC at follow-up in regard to thrombocytopenia   Follow-up Information    Follow up with Leonides Grills, MD.   Specialty:  Family Medicine   Why:  keep scheduled appointment 9/15   Contact information:   [address] [place] [address] [phone]        Discharge Diagnoses:  1. CAP.  2. Chronic diarrhea.  3. Intermittent thrombocytopenia, 4. DM Type 2 with associated PAD.  5. HTN. 6. Hypothyroidism.  7. Dementia.  Discharge Condition: Improved  Disposition: Discharge home   Diet recommendation: Regular  Filed Weights   01/05/15 1605  Weight: 61.236 kg (135 lb)    History of present illness:  1 yom PMH dementia presented with complaints of progressive weakness, unable to get up from bed, more lethargic, poor oral intake. Chest x-ray revealed mild patchy opacity of left lung base developing pneumonia not excluded. Admitted for CAP  Hospital Course:  CAP was treated with IV Rocephin and Zithromax with excellent improvement of symptoms. Imodium was continued with no issues concerning chronic diarrhea during hospitalization. All other issues remained stable during this admission.   Individual issues as below:  1. CAP, clinically resolved. No hypoxia. Revealed on CXR. Blood and urine cultures showed no growth in the last 2 days. Pt remains afebrile. 2. Chronic diarrhea. Continue 4 mg Imodium daily at bedtime 3. Intermittent thrombocytopenia, seen in past as well.  4. DM Type 2 with associated PAD.   5. HTN, Stable. 6. Hypothyroidism. Continue Synthroid.  7. Dementia, back to  baseline.  Consultants:  None   Procedures:  None   Antibiotics:  Rocephin 8/30>>8/31  Zithromax 8/30>>9/3  Ceftin 9/1>>9/5  Discharge Instructions   Discharge Instructions    Diet - low sodium heart healthy    Complete by:  As directed      Diet Carb Modified    Complete by:  As directed      Discharge instructions    Complete by:  As directed   Call your physician or seek immediate medical attention for fever, shortness of breath, confusion, fatigue or worsening of condition.     Increase activity slowly    Complete by:  As directed              Current Discharge Medication List    START taking these medications   Details  azithromycin (ZITHROMAX) 250 MG tablet Take 1 tablet (250 mg total) by mouth daily. Take in the evening each day. Qty: 3 tablet, Refills: 0    cefUROXime (CEFTIN) 500 MG tablet Take 1 tablet (500 mg total) by mouth 2 (two) times daily with a meal. Qty: 10 tablet, Refills: 0      CONTINUE these medications which have NOT CHANGED   Details  amLODipine (NORVASC) 5 MG tablet Take 5 mg by mouth daily.    aspirin EC 81 MG tablet Take 81 mg by mouth daily.      atenolol (TENORMIN) 50 MG tablet Take 50 mg by mouth daily.      donepezil (ARICEPT) 10 MG tablet Take 10 mg by mouth at bedtime.     HUMALOG 100 UNIT/ML injection Inject 3-5 Units into the skin 2 (two)  times daily with a meal. Sliding scale    hydrocortisone cream 1 % Apply 1 application topically daily as needed (for irritation).    LANTUS 100 UNIT/ML injection Inject 15 Units into the skin at bedtime.     levothyroxine (SYNTHROID, LEVOTHROID) 175 MCG tablet Take 175 mcg by mouth daily.    loperamide (IMODIUM) 2 MG capsule Take 4 mg by mouth at bedtime.     memantine (NAMENDA) 10 MG tablet Take 10 mg by mouth 2 (two) times daily.     nitroGLYCERIN (NITROSTAT) 0.4 MG SL tablet Place 1 tablet (0.4 mg total) under the tongue every 5 (five) minutes as needed for chest pain. Qty:  25 tablet, Refills: 3    olmesartan (BENICAR) 20 MG tablet Take 20 mg by mouth 2 (two) times daily before a meal.     pravastatin (PRAVACHOL) 40 MG tablet Take 40 mg by mouth every morning.     Tolnaftate (ANTI-FUNGAL EX) Apply 1 application topically daily as needed (irritation).    gabapentin (NEURONTIN) 100 MG capsule Take 100 mg by mouth daily as needed (for neuropathy).        No Known Allergies  The results of significant diagnostics from this hospitalization (including imaging, microbiology, ancillary and laboratory) are listed below for reference.    Significant Diagnostic Studies: Dg Chest Portable 1 View  01/05/2015   CLINICAL DATA:  Progressively getting week. Having trouble eating and feeding self.  EXAM: PORTABLE CHEST - 1 VIEW  COMPARISON:  December 01, 2012  FINDINGS: The heart size and mediastinal contours are stable. The heart size is mildly enlarged. The aorta is tortuous. There is mild patchy opacity of left lung base. There is no pulmonary edema or pleural effusion. The visualized skeletal structures are stable.  IMPRESSION: Mild patchy opacity of left lung base, developing pneumonia is not excluded.   Electronically Signed   By: Abelardo Diesel M.D.   On: 01/05/2015 16:38    Microbiology: Recent Results (from the past 240 hour(s))  Urine culture     Status: None   Collection Time: 01/05/15  5:52 PM  Result Value Ref Range Status   Specimen Description URINE, CATHETERIZED  Final   Special Requests NONE  Final   Culture   Final    NO GROWTH 1 DAY Performed at [facility]    Report Status 01/06/2015 FINAL  Final  Culture, blood (routine x 2)     Status: None (Preliminary result)   Collection Time: 01/05/15  9:08 PM  Result Value Ref Range Status   Specimen Description BLOOD RIGHT FOREARM  Final   Special Requests BOTTLES DRAWN AEROBIC AND ANAEROBIC 6CC  Final   Culture NO GROWTH 2 DAYS  Final   Report Status PENDING  Incomplete  Culture, blood (routine x 2)      Status: None (Preliminary result)   Collection Time: 01/05/15  9:20 PM  Result Value Ref Range Status   Specimen Description BLOOD RIGHT HAND  Final   Special Requests BOTTLES DRAWN AEROBIC AND ANAEROBIC 6CC  Final   Culture NO GROWTH 2 DAYS  Final   Report Status PENDING  Incomplete     Labs: Basic Metabolic Panel:  Recent Labs Lab 01/05/15 1627 01/06/15 0609  NA 142 140  K 3.8 3.8  CL 106 104  CO2 28 30  GLUCOSE 199* 183*  BUN 24* 18  CREATININE 1.06 0.95  CALCIUM 8.9 8.3*   Liver Function Tests:  Recent Labs Lab 01/05/15 1629 01/06/15  0609  AST 19 14*  ALT 12* 11*  ALKPHOS 87 75  BILITOT 1.2 0.8  PROT 7.3 6.6  ALBUMIN 3.4* 2.9*   CBC:  Recent Labs Lab 01/05/15 1627 01/06/15 0609  WBC 13.3* 8.2  NEUTROABS 11.4*  --   HGB 15.4 14.3  HCT 47.3 44.7  MCV 89.6 89.9  PLT 131* 111*   Cardiac Enzymes:  Recent Labs Lab 01/05/15 1629  TROPONINI 0.03   CBG:  Recent Labs Lab 01/06/15 0748 01/06/15 1158 01/06/15 1614 01/06/15 2101 01/07/15 0723  GLUCAP 165* 213* 153* 230* 135*    Principal Problem:   CAP (community acquired pneumonia) Active Problems:   HTN (hypertension)   Colon cancer   Chronic diarrhea   Diabetes type 2, uncontrolled   Time coordinating discharge: 35 minutes   Signed:  Murray Hodgkins, MD [facility] 01/07/2015, 8:23 AM   I, Laban Emperor. Leonie Green, acting as scribe, recorded this note contemporaneously in the presence of Dr. Melene Plan. Sarajane Jews, M.D. on 01/07/2015 .  I have reviewed the above documentation for accuracy and completeness, and I agree with the above. Murray Hodgkins, MD

## 2015-01-07 NOTE — Progress Notes (Signed)
No IV in place. Discharge instructions reviewed with patient and family. Understanding verbalized. Ready for discharge home.

## 2015-01-10 LAB — CULTURE, BLOOD (ROUTINE X 2)
CULTURE: NO GROWTH
Culture: NO GROWTH

## 2015-03-11 ENCOUNTER — Encounter (INDEPENDENT_AMBULATORY_CARE_PROVIDER_SITE_OTHER): Payer: Self-pay | Admitting: *Deleted

## 2015-04-27 ENCOUNTER — Observation Stay (HOSPITAL_COMMUNITY)
Admission: EM | Admit: 2015-04-27 | Discharge: 2015-04-30 | Disposition: A | Discharge status: Home / Self Care | Payer: Medicare Other | Attending: Internal Medicine | Admitting: Internal Medicine

## 2015-04-27 ENCOUNTER — Encounter (HOSPITAL_COMMUNITY): Payer: Self-pay | Admitting: Emergency Medicine

## 2015-04-27 ENCOUNTER — Emergency Department (HOSPITAL_COMMUNITY): Payer: Medicare Other

## 2015-04-27 DIAGNOSIS — Z79899 Other long term (current) drug therapy: Secondary | ICD-10-CM | POA: Diagnosis not present

## 2015-04-27 DIAGNOSIS — R112 Nausea with vomiting, unspecified: Principal | ICD-10-CM | POA: Insufficient documentation

## 2015-04-27 DIAGNOSIS — X31XXXA Exposure to excessive natural cold, initial encounter: Secondary | ICD-10-CM | POA: Diagnosis not present

## 2015-04-27 DIAGNOSIS — R1110 Vomiting, unspecified: Secondary | ICD-10-CM

## 2015-04-27 DIAGNOSIS — I2510 Atherosclerotic heart disease of native coronary artery without angina pectoris: Secondary | ICD-10-CM | POA: Diagnosis present

## 2015-04-27 DIAGNOSIS — I509 Heart failure, unspecified: Secondary | ICD-10-CM | POA: Insufficient documentation

## 2015-04-27 DIAGNOSIS — IMO0002 Reserved for concepts with insufficient information to code with codable children: Secondary | ICD-10-CM | POA: Diagnosis present

## 2015-04-27 DIAGNOSIS — Z7982 Long term (current) use of aspirin: Secondary | ICD-10-CM | POA: Diagnosis not present

## 2015-04-27 DIAGNOSIS — R4182 Altered mental status, unspecified: Secondary | ICD-10-CM

## 2015-04-27 DIAGNOSIS — Z87891 Personal history of nicotine dependence: Secondary | ICD-10-CM | POA: Insufficient documentation

## 2015-04-27 DIAGNOSIS — E876 Hypokalemia: Secondary | ICD-10-CM | POA: Diagnosis present

## 2015-04-27 DIAGNOSIS — T68XXXA Hypothermia, initial encounter: Secondary | ICD-10-CM | POA: Diagnosis present

## 2015-04-27 DIAGNOSIS — I447 Left bundle-branch block, unspecified: Secondary | ICD-10-CM | POA: Diagnosis present

## 2015-04-27 DIAGNOSIS — E119 Type 2 diabetes mellitus without complications: Secondary | ICD-10-CM | POA: Insufficient documentation

## 2015-04-27 DIAGNOSIS — F0390 Unspecified dementia without behavioral disturbance: Secondary | ICD-10-CM | POA: Diagnosis present

## 2015-04-27 DIAGNOSIS — I739 Peripheral vascular disease, unspecified: Secondary | ICD-10-CM | POA: Diagnosis present

## 2015-04-27 DIAGNOSIS — Z85038 Personal history of other malignant neoplasm of large intestine: Secondary | ICD-10-CM | POA: Insufficient documentation

## 2015-04-27 DIAGNOSIS — I472 Ventricular tachycardia: Secondary | ICD-10-CM

## 2015-04-27 DIAGNOSIS — I10 Essential (primary) hypertension: Secondary | ICD-10-CM | POA: Diagnosis not present

## 2015-04-27 DIAGNOSIS — K469 Unspecified abdominal hernia without obstruction or gangrene: Secondary | ICD-10-CM | POA: Diagnosis not present

## 2015-04-27 DIAGNOSIS — E785 Hyperlipidemia, unspecified: Secondary | ICD-10-CM | POA: Insufficient documentation

## 2015-04-27 DIAGNOSIS — R410 Disorientation, unspecified: Secondary | ICD-10-CM | POA: Diagnosis present

## 2015-04-27 DIAGNOSIS — R000 Tachycardia, unspecified: Secondary | ICD-10-CM

## 2015-04-27 DIAGNOSIS — E1165 Type 2 diabetes mellitus with hyperglycemia: Secondary | ICD-10-CM | POA: Diagnosis present

## 2015-04-27 DIAGNOSIS — E039 Hypothyroidism, unspecified: Secondary | ICD-10-CM | POA: Diagnosis not present

## 2015-04-27 LAB — URINALYSIS, ROUTINE W REFLEX MICROSCOPIC
BILIRUBIN URINE: NEGATIVE
GLUCOSE, UA: 250 mg/dL — AB
KETONES UR: 15 mg/dL — AB
Leukocytes, UA: NEGATIVE
NITRITE: NEGATIVE
PH: 6 (ref 5.0–8.0)
SPECIFIC GRAVITY, URINE: 1.025 (ref 1.005–1.030)

## 2015-04-27 LAB — CBC
HEMATOCRIT: 50.2 % (ref 39.0–52.0)
HEMOGLOBIN: 16.5 g/dL (ref 13.0–17.0)
MCH: 29.7 pg (ref 26.0–34.0)
MCHC: 32.9 g/dL (ref 30.0–36.0)
MCV: 90.5 fL (ref 78.0–100.0)
Platelets: 128 10*3/uL — ABNORMAL LOW (ref 150–400)
RBC: 5.55 MIL/uL (ref 4.22–5.81)
RDW: 12.9 % (ref 11.5–15.5)
WBC: 12.7 10*3/uL — ABNORMAL HIGH (ref 4.0–10.5)

## 2015-04-27 LAB — COMPREHENSIVE METABOLIC PANEL
ALK PHOS: 106 U/L (ref 38–126)
ALT: 16 U/L — ABNORMAL LOW (ref 17–63)
ANION GAP: 12 (ref 5–15)
AST: 26 U/L (ref 15–41)
Albumin: 3.7 g/dL (ref 3.5–5.0)
BUN: 19 mg/dL (ref 6–20)
CALCIUM: 9.1 mg/dL (ref 8.9–10.3)
CHLORIDE: 104 mmol/L (ref 101–111)
CO2: 24 mmol/L (ref 22–32)
Creatinine, Ser: 1.08 mg/dL (ref 0.61–1.24)
GFR calc non Af Amer: >60 mL/min (ref >60)
Glucose, Bld: 288 mg/dL — ABNORMAL HIGH (ref 65–99)
POTASSIUM: 3 mmol/L — AB (ref 3.5–5.1)
SODIUM: 140 mmol/L (ref 135–145)
Total Bilirubin: 0.5 mg/dL (ref 0.3–1.2)
Total Protein: 7.9 g/dL (ref 6.5–8.1)

## 2015-04-27 LAB — URINE MICROSCOPIC-ADD ON

## 2015-04-27 LAB — TROPONIN I: TROPONIN I: 0.03 ng/mL (ref <0.031)

## 2015-04-27 LAB — LIPASE, BLOOD: LIPASE: 27 U/L (ref 11–51)

## 2015-04-27 LAB — LACTIC ACID, PLASMA: Lactic Acid, Venous: 3.3 mmol/L (ref 0.5–2.0)

## 2015-04-27 MED ORDER — SODIUM CHLORIDE 0.9 % IV BOLUS (SEPSIS)
1000.0000 mL | Freq: Once | INTRAVENOUS | Status: AC
  Administered 2015-04-27: 1000 mL via INTRAVENOUS

## 2015-04-27 MED ORDER — ONDANSETRON HCL 4 MG/2ML IJ SOLN
4.0000 mg | Freq: Once | INTRAMUSCULAR | Status: AC
  Administered 2015-04-27: 4 mg via INTRAVENOUS
  Filled 2015-04-27: qty 2

## 2015-04-27 NOTE — ED Provider Notes (Signed)
CSN: [id number]     Arrival date & time 04/27/15  2133 History  By signing my name below, I, [facility], attest that this documentation has been prepared under the direction and in the presence of Jola Schmidt, MD. Electronically Signed: Virgel Bouquet, ED Scribe. 04/27/2015. 10:53 PM.   Chief Complaint  Patient presents with  . Altered Mental Status  . Emesis   The history is provided by a relative. No language interpreter was used.   LEVEL V caveat: Dementia  HPI Comments: Peter Harvey is a 74 y.o. male brought in by EMS with an hx of dementia who presents to the Emergency Department complaining of altered mental status onset this evening upon waking.  Per son, patient has a caregiver who reported patient's last known normal was 4 hours ago before going to sleep. Son reports that the patient woke up screaming, began to vomit.  Family also endorses profuse diaphoresis with onset of other symptoms earlier tonight and that the patient had diarrhea yesterday.    Past Medical History  Diagnosis Date  . HTN (hypertension)   . DM (diabetes mellitus) ([place])   . Hypothyroidism   . Hernia   . Dizziness   . AAA (abdominal aortic aneurysm) ([place])     Dr. Scot Dock  . Hyperlipidemia   . Leg pain     with walking  . Colon cancer ([place])     adenocarcinoma; s/p R hemicolectomy 8/12  . CAD (coronary artery disease)     EF 57% by Myoview 4/12;  cath 5/12: dLM 50%, pLAD 50%, pCFX 50%, OM1 occluded with distal filling with L-L collats, mRCA 70-75%, dRCA 80%;  severe RCA needs PCI but treated medically due to need for colon surgery and plans to possilby perform PCI in future  . PAD (peripheral artery disease) ([facility])     c/b dry gangrene of toes  . CHF (congestive heart failure) ([place])   . COPD (chronic obstructive pulmonary disease) ([place])   . Dementia   . Wheelchair bound    Past Surgical History  Procedure Laterality Date  . Hernia repair    . Appendectomy    . Cardiac  catheterization    . Colon surgery  12/12/10    r/t colon CA  . Abdominal surgery    . Abdominal aortic aneurysm repair  02/17/11    Open AAA repair and Right Fem-Pop  . Colonoscopy  03/14/2012    Procedure: COLONOSCOPY;  Surgeon: Rogene Houston, MD;  Location: [facility]O SU[facility];  Service: Endoscopy;  Laterality: N/A;  345-Ann to notify pt of new time   Family History  Problem Relation Age of Onset  . Diabetes Mother   . Heart disease Mother     heart failure  . Heart attack Mother   . Heart disease Brother   . Other Brother 58    MRSA  . COPD Father   . Heart disease Father     heart failure  . COPD Sister    Social History  Substance Use Topics  . Smoking status: Former Smoker -- 2.00 packs/day for 50 years    Types: Cigarettes    Quit date: 12/11/2010  . Smokeless tobacco: Never Used  . Alcohol Use: No    Review of Systems  Unable to perform ROS: Dementia      Allergies  Review of patient's allergies indicates no known allergies.  Home Medications   Prior to Admission medications   Medication Sig Start Date End Date Taking?  Authorizing Provider  amLODipine (NORVASC) 5 MG tablet Take 5 mg by mouth daily.    Historical Provider, MD  aspirin EC 81 MG tablet Take 81 mg by mouth daily.      Historical Provider, MD  atenolol (TENORMIN) 50 MG tablet Take 50 mg by mouth daily.      Historical Provider, MD  azithromycin (ZITHROMAX) 250 MG tablet Take 1 tablet (250 mg total) by mouth daily. Take in the evening each day. 01/07/15   Samuella Cota, MD  cefUROXime (CEFTIN) 500 MG tablet Take 1 tablet (500 mg total) by mouth 2 (two) times daily with a meal. 01/07/15   Samuella Cota, MD  donepezil (ARICEPT) 10 MG tablet Take 10 mg by mouth at bedtime.     Historical Provider, MD  gabapentin (NEURONTIN) 100 MG capsule Take 100 mg by mouth daily as needed (for neuropathy).  03/27/14   Historical Provider, MD  HUMALOG 100 UNIT/ML injection Inject 3-5 Units into the skin 2 (two)  times daily with a meal. Sliding scale 10/27/14   Historical Provider, MD  hydrocortisone cream 1 % Apply 1 application topically daily as needed (for irritation).    Historical Provider, MD  LANTUS 100 UNIT/ML injection Inject 15 Units into the skin at bedtime.  03/14/11   Historical Provider, MD  levothyroxine (SYNTHROID, LEVOTHROID) 175 MCG tablet Take 175 mcg by mouth daily.    Historical Provider, MD  loperamide (IMODIUM) 2 MG capsule Take 4 mg by mouth at bedtime.     Historical Provider, MD  memantine (NAMENDA) 10 MG tablet Take 10 mg by mouth 2 (two) times daily.  12/06/14   Historical Provider, MD  nitroGLYCERIN (NITROSTAT) 0.4 MG SL tablet Place 1 tablet (0.4 mg total) under the tongue every 5 (five) minutes as needed for chest pain. 07/11/12   Josue Hector, MD  olmesartan (BENICAR) 20 MG tablet Take 20 mg by mouth 2 (two) times daily before a meal.     Historical Provider, MD  pravastatin (PRAVACHOL) 40 MG tablet Take 40 mg by mouth every morning.  07/08/12   Historical Provider, MD  Tolnaftate (ANTI-FUNGAL EX) Apply 1 application topically daily as needed (irritation).    Historical Provider, MD   BP 122/64 mmHg  Pulse 59  Temp(Src) 94.4 F (34.7 C) (Rectal)  Resp 14  Ht 5\' 10"  (1.778 m)  Wt 135 lb (61.236 kg)  BMI 19.37 kg/m2  SpO2 91% Physical Exam  Constitutional: He appears well-developed and well-nourished.  HENT:  Head: Normocephalic and atraumatic.  Eyes: EOM are normal.  Neck: Normal range of motion.  Cardiovascular: Normal rate, regular rhythm, normal heart sounds and intact distal pulses.   Pulmonary/Chest: Effort normal and breath sounds normal. No respiratory distress.  Abdominal: Soft. He exhibits no distension. There is no tenderness.  Musculoskeletal: Normal range of motion.  Neurological: He is alert.  Oriented to person but not place or time. Grossly moves all 4 extremities equally  Skin: Skin is warm and dry.  Psychiatric: He has a normal mood and affect.  Judgment normal.  Nursing note and vitals reviewed.   ED Course  Procedures   DIAGNOSTIC STUDIES: Oxygen Saturation is 99% on RA, normal by my interpretation.    COORDINATION OF CARE: 9:56 PM Discussed treatment plan with pt at bedside and pt agreed to plan.  Labs Review Labs Reviewed  URINALYSIS, ROUTINE W REFLEX MICROSCOPIC (NOT AT [facility]) - Abnormal; Notable for the following:    Glucose, UA 250 (*)  Hgb urine dipstick LARGE (*)    Ketones, ur 15 (*)    Protein, ur TRACE (*)    All other components within normal limits  COMPREHENSIVE METABOLIC PANEL - Abnormal; Notable for the following:    Potassium 3.0 (*)    Glucose, Bld 288 (*)    ALT 16 (*)    All other components within normal limits  CBC - Abnormal; Notable for the following:    WBC 12.7 (*)    Platelets 128 (*)    All other components within normal limits  LACTIC ACID, PLASMA - Abnormal; Notable for the following:    Lactic Acid, Venous 3.3 (*)    All other components within normal limits  URINE MICROSCOPIC-ADD ON - Abnormal; Notable for the following:    Squamous Epithelial / LPF 0-5 (*)    Bacteria, UA RARE (*)    All other components within normal limits  CULTURE, BLOOD (ROUTINE X 2)  CULTURE, BLOOD (ROUTINE X 2)  LIPASE, BLOOD  TROPONIN I    Imaging Review Dg Chest 1 View  04/27/2015  CLINICAL DATA:  74 year old male with altered mental status. History of COPD and CHF EXAM: CHEST 1 VIEW COMPARISON:  Radiograph dated 01/05/2015 FINDINGS: Single-view of the chest demonstrates emphysematous changes of the lungs. No focal consolidation, pleural effusion, or pneumothorax. Stable cardiac silhouette. The osseous structures are grossly unremarkable. IMPRESSION: No active disease. Electronically Signed   By: Anner Crete M.D.   On: 04/27/2015 23:22   Ct Head Wo Contrast  04/27/2015  CLINICAL DATA:  Altered mental status, vomiting. EXAM: CT HEAD WITHOUT CONTRAST TECHNIQUE: Contiguous axial images were  obtained from the base of the skull through the vertex without intravenous contrast. COMPARISON:  MRI of the head Sep 11, 2014 FINDINGS: The ventricles and sulci are proportionally mildly prominent for age. Mild disproportionate cerebellar volume loss. No intraparenchymal hemorrhage, mass effect nor midline shift. Patchy to confluent supratentorial white matter hypodensities. No acute large vascular territory infarcts. Biparietal encephalomalacia. No abnormal extra-axial fluid collections. Basal cisterns are patent. Moderate calcific atherosclerosis of the carotid siphons and included vertebral arteries. No skull fracture. The included ocular globes and orbital contents are non-suspicious. The mastoid aircells and included paranasal sinuses are well-aerated. Soft tissue within the RIGHT external auditory canal compatible with cerumen. IMPRESSION: No acute intracranial process. Moderate to severe global brain atrophy with somewhat disproportionate cerebellar volume loss. Moderate to severe chronic small vessel ischemic disease. Biparietal encephalomalacia, present on prior CT examination. Electronically Signed   By: Elon Alas M.D.   On: 04/27/2015 23:48   I have personally reviewed and evaluated these images and lab results as part of my medical decision-making.   EKG Interpretation   Date/Time:  Tuesday April 27 2015 21:38:57 EST Ventricular Rate:  70 PR Interval:  108 QRS Duration: 152 QT Interval:  446 QTC Calculation: 481 R Axis:   57 Text Interpretation:  Sinus or ectopic atrial rhythm Multiple premature  complexes, vent & supraven Left bundle branch block No significant change  was found Confirmed by Jennelle Pinkstaff  MD, Yerik Zeringue ([id number]) on 04/28/2015 12:33:48 AM      MDM   Final diagnoses:  Nausea and vomiting, vomiting of unspecified type  Hypothermia, initial encounter    12:33 AM Pt is beginning to feel better. Given elevated lactate, age, dementia and hypothermia we will watch  overnight in the hospital. Suspect viral illness given n/v/d. Abdominal exam benign. Will need to repeat lactate after IVFs  I personally performed  the services described in this documentation, which was scribed in my presence. The recorded information has been reviewed and is accurate.       Jola Schmidt, MD 04/28/15 [phone]

## 2015-04-27 NOTE — ED Notes (Signed)
Per son, patient is normally alert and able to communicate. History of dementia. Patient's son states patient was last normal around 1800,and homecare nurse called and stated altered mental status, lethargy, and vomiting at 2030.

## 2015-04-27 NOTE — ED Notes (Signed)
CRITICAL VALUE ALERT  Critical value received:  Lactic Acid 3.3  Date of notification:  04/27/15  Time of notification:  2336  Critical value read back:Yes.    Nurse who received alert:  Lucy Antigua, RN  Responding MD:  Dr. Venora Maples  Time MD responded:  2337

## 2015-04-27 NOTE — ED Notes (Signed)
Patient in from EMS. Family called for altered mental status that they noticed when he woke up. Family told EMS patient would not respond or talk to them. Patient is drowsy, oriented to person, not to place, time, or situation.

## 2015-04-28 ENCOUNTER — Observation Stay (HOSPITAL_BASED_OUTPATIENT_CLINIC_OR_DEPARTMENT_OTHER): Payer: Medicare Other

## 2015-04-28 ENCOUNTER — Encounter (HOSPITAL_COMMUNITY): Payer: Self-pay | Admitting: *Deleted

## 2015-04-28 DIAGNOSIS — T68XXXA Hypothermia, initial encounter: Secondary | ICD-10-CM | POA: Diagnosis present

## 2015-04-28 DIAGNOSIS — I2510 Atherosclerotic heart disease of native coronary artery without angina pectoris: Secondary | ICD-10-CM | POA: Diagnosis not present

## 2015-04-28 DIAGNOSIS — F0391 Unspecified dementia with behavioral disturbance: Secondary | ICD-10-CM | POA: Diagnosis not present

## 2015-04-28 DIAGNOSIS — I499 Cardiac arrhythmia, unspecified: Secondary | ICD-10-CM | POA: Diagnosis not present

## 2015-04-28 DIAGNOSIS — F0390 Unspecified dementia without behavioral disturbance: Secondary | ICD-10-CM | POA: Diagnosis not present

## 2015-04-28 DIAGNOSIS — I447 Left bundle-branch block, unspecified: Secondary | ICD-10-CM | POA: Diagnosis present

## 2015-04-28 DIAGNOSIS — E876 Hypokalemia: Secondary | ICD-10-CM | POA: Diagnosis not present

## 2015-04-28 DIAGNOSIS — R000 Tachycardia, unspecified: Secondary | ICD-10-CM

## 2015-04-28 DIAGNOSIS — E1165 Type 2 diabetes mellitus with hyperglycemia: Secondary | ICD-10-CM | POA: Diagnosis not present

## 2015-04-28 DIAGNOSIS — E860 Dehydration: Secondary | ICD-10-CM | POA: Insufficient documentation

## 2015-04-28 DIAGNOSIS — R410 Disorientation, unspecified: Secondary | ICD-10-CM

## 2015-04-28 DIAGNOSIS — I10 Essential (primary) hypertension: Secondary | ICD-10-CM

## 2015-04-28 DIAGNOSIS — T68XXXD Hypothermia, subsequent encounter: Secondary | ICD-10-CM

## 2015-04-28 DIAGNOSIS — I472 Ventricular tachycardia: Secondary | ICD-10-CM | POA: Diagnosis not present

## 2015-04-28 DIAGNOSIS — Z794 Long term (current) use of insulin: Secondary | ICD-10-CM

## 2015-04-28 LAB — GLUCOSE, CAPILLARY
GLUCOSE-CAPILLARY: 139 mg/dL — AB (ref 65–99)
GLUCOSE-CAPILLARY: 106 mg/dL — AB (ref 65–99)
Glucose-Capillary: 190 mg/dL — ABNORMAL HIGH (ref 65–99)
Glucose-Capillary: 114 mg/dL — ABNORMAL HIGH (ref 65–99)
Glucose-Capillary: 124 mg/dL — ABNORMAL HIGH (ref 65–99)

## 2015-04-28 LAB — TSH: TSH: 0.045 u[IU]/mL — AB (ref 0.350–4.500)

## 2015-04-28 LAB — COMPREHENSIVE METABOLIC PANEL
ALBUMIN: 3.1 g/dL — AB (ref 3.5–5.0)
ALK PHOS: 85 U/L (ref 38–126)
ALT: 14 U/L — AB (ref 17–63)
AST: 20 U/L (ref 15–41)
Anion gap: 7 (ref 5–15)
BUN: 19 mg/dL (ref 6–20)
CALCIUM: 8.6 mg/dL — AB (ref 8.9–10.3)
CHLORIDE: 110 mmol/L (ref 101–111)
CO2: 27 mmol/L (ref 22–32)
CREATININE: 0.95 mg/dL (ref 0.61–1.24)
GFR calc Af Amer: >60 mL/min (ref >60)
GFR calc non Af Amer: >60 mL/min (ref >60)
GLUCOSE: 179 mg/dL — AB (ref 65–99)
Potassium: 4.2 mmol/L (ref 3.5–5.1)
SODIUM: 144 mmol/L (ref 135–145)
Total Bilirubin: 0.8 mg/dL (ref 0.3–1.2)
Total Protein: 6.7 g/dL (ref 6.5–8.1)

## 2015-04-28 LAB — MAGNESIUM: Magnesium: 1.9 mg/dL (ref 1.7–2.4)

## 2015-04-28 LAB — CBC
HCT: 47 % (ref 39.0–52.0)
Hemoglobin: 15.1 g/dL (ref 13.0–17.0)
MCH: 28.9 pg (ref 26.0–34.0)
MCHC: 32.1 g/dL (ref 30.0–36.0)
MCV: 90 fL (ref 78.0–100.0)
PLATELETS: 124 10*3/uL — AB (ref 150–400)
RBC: 5.22 MIL/uL (ref 4.22–5.81)
RDW: 12.9 % (ref 11.5–15.5)
WBC: 8.9 10*3/uL (ref 4.0–10.5)

## 2015-04-28 LAB — CBG MONITORING, ED: Glucose-Capillary: 277 mg/dL — ABNORMAL HIGH (ref 65–99)

## 2015-04-28 LAB — LACTIC ACID, PLASMA: LACTIC ACID, VENOUS: 2.1 mmol/L — AB (ref 0.5–2.0)

## 2015-04-28 MED ORDER — LEVOTHYROXINE SODIUM 75 MCG PO TABS
175.0000 ug | ORAL_TABLET | Freq: Every day | ORAL | Status: DC
  Administered 2015-04-28 – 2015-04-29 (×2): 175 ug via ORAL
  Filled 2015-04-28 (×2): qty 1

## 2015-04-28 MED ORDER — POTASSIUM CHLORIDE CRYS ER 20 MEQ PO TBCR
40.0000 meq | EXTENDED_RELEASE_TABLET | ORAL | Status: AC
  Administered 2015-04-28 (×2): 40 meq via ORAL
  Filled 2015-04-28 (×2): qty 2

## 2015-04-28 MED ORDER — DONEPEZIL HCL 5 MG PO TABS
10.0000 mg | ORAL_TABLET | Freq: Every day | ORAL | Status: DC
  Administered 2015-04-28 – 2015-04-29 (×3): 10 mg via ORAL
  Filled 2015-04-28: qty 1
  Filled 2015-04-28: qty 2
  Filled 2015-04-28 (×4): qty 1
  Filled 2015-04-28: qty 2
  Filled 2015-04-28: qty 1
  Filled 2015-04-28: qty 2

## 2015-04-28 MED ORDER — ONDANSETRON HCL 4 MG/2ML IJ SOLN: 4.0000 mg | Freq: Four times a day (QID) | INTRAMUSCULAR | Status: DC | PRN

## 2015-04-28 MED ORDER — INSULIN GLARGINE 100 UNIT/ML ~~LOC~~ SOLN
15.0000 [IU] | Freq: Every day | SUBCUTANEOUS | Status: DC
  Administered 2015-04-28 – 2015-04-29 (×3): 15 [IU] via SUBCUTANEOUS
  Filled 2015-04-28 (×6): qty 0.15

## 2015-04-28 MED ORDER — ENOXAPARIN SODIUM 40 MG/0.4ML ~~LOC~~ SOLN
40.0000 mg | SUBCUTANEOUS | Status: DC
  Administered 2015-04-28 (×2): 40 mg via SUBCUTANEOUS
  Filled 2015-04-28 (×2): qty 0.4

## 2015-04-28 MED ORDER — AMLODIPINE BESYLATE 5 MG PO TABS
5.0000 mg | ORAL_TABLET | Freq: Every day | ORAL | Status: DC
  Administered 2015-04-28 – 2015-04-30 (×3): 5 mg via ORAL
  Filled 2015-04-28 (×3): qty 1

## 2015-04-28 MED ORDER — POTASSIUM CHLORIDE IN NACL 20-0.9 MEQ/L-% IV SOLN
INTRAVENOUS | Status: DC
  Administered 2015-04-28 (×2): via INTRAVENOUS

## 2015-04-28 MED ORDER — PRAVASTATIN SODIUM 40 MG PO TABS
40.0000 mg | ORAL_TABLET | Freq: Every morning | ORAL | Status: DC
  Administered 2015-04-28 – 2015-04-30 (×3): 40 mg via ORAL
  Filled 2015-04-28 (×3): qty 1

## 2015-04-28 MED ORDER — POTASSIUM CHLORIDE CRYS ER 20 MEQ PO TBCR
20.0000 meq | EXTENDED_RELEASE_TABLET | Freq: Every day | ORAL | Status: DC
  Administered 2015-04-28 – 2015-04-30 (×3): 20 meq via ORAL
  Filled 2015-04-28 (×3): qty 1

## 2015-04-28 MED ORDER — LEVOTHYROXINE SODIUM 75 MCG PO TABS: 175.0000 ug | ORAL_TABLET | Freq: Every day | ORAL | Status: DC

## 2015-04-28 MED ORDER — IRBESARTAN 150 MG PO TABS
150.0000 mg | ORAL_TABLET | Freq: Every day | ORAL | Status: DC
  Administered 2015-04-28 – 2015-04-30 (×3): 150 mg via ORAL
  Filled 2015-04-28 (×3): qty 1

## 2015-04-28 MED ORDER — ONDANSETRON HCL 4 MG PO TABS: 4.0000 mg | ORAL_TABLET | Freq: Four times a day (QID) | ORAL | Status: DC | PRN

## 2015-04-28 MED ORDER — MEMANTINE HCL 10 MG PO TABS
10.0000 mg | ORAL_TABLET | Freq: Two times a day (BID) | ORAL | Status: DC
  Administered 2015-04-28 – 2015-04-30 (×5): 10 mg via ORAL
  Filled 2015-04-28 (×5): qty 1

## 2015-04-28 MED ORDER — ATENOLOL 25 MG PO TABS
50.0000 mg | ORAL_TABLET | Freq: Every day | ORAL | Status: DC
  Administered 2015-04-28 – 2015-04-30 (×3): 50 mg via ORAL
  Filled 2015-04-28 (×3): qty 2

## 2015-04-28 MED ORDER — ASPIRIN EC 81 MG PO TBEC
81.0000 mg | DELAYED_RELEASE_TABLET | Freq: Every day | ORAL | Status: DC
  Administered 2015-04-28 – 2015-04-30 (×3): 81 mg via ORAL
  Filled 2015-04-28 (×3): qty 1

## 2015-04-28 NOTE — Progress Notes (Signed)
Pt's IV to right upper arm infiltrated. IV attempted multiple times by 2 RN's without access. MD paged and made aware. Instructed to encourage PO intake throughout the night and reevaluate in the AM.

## 2015-04-28 NOTE — Care Management Obs Status (Signed)
[place] NOTIFICATION   Patient Details  Name: JOHNROSS HAJNY MRN: [id number] Date of Birth: 10/06/1940   Medicare Observation Status Notification Given:  Yes    Joylene Draft, RN 04/28/2015, 11:11 AM

## 2015-04-28 NOTE — Progress Notes (Signed)
Inpatient Diabetes Program Recommendations  AACE/ADA: New Consensus Statement on Inpatient Glycemic Control (2015)  Target Ranges:  Prepandial:   less than 140 mg/dL      Peak postprandial:   less than 180 mg/dL (1-2 hours)      Critically ill patients:  140 - 180 mg/dL  Results for Peter Harvey, Peter Harvey (MRN [id number]) as of 04/28/2015 08:35  Ref. Range 04/27/2015 21:52 04/28/2015 02:21 04/28/2015 07:52  Glucose-Capillary Latest Ref Range: 65-99 mg/dL 277 (H) 190 (H) 139 (H)   Review of Glycemic Control  Diabetes history: DM2 Outpatient Diabetes medications: Lantus 15 units QHS, Humalog 3-5 units BID Current orders for Inpatient glycemic control: Lantus 15 units QHS  Inpatient Diabetes Program Recommendations: Correction (SSI): Please consider ordering CBGs with Novolog sensitive correction scale ACHS.  Thanks, Barnie Alderman, RN, MSN, CDE Diabetes Coordinator Inpatient Diabetes Program [phone] (Team Pager from [place] to [place]) [phone] ([facility] office) [phone] [facility] office) [phone] [facility] office)

## 2015-04-28 NOTE — H&P (Signed)
PCP:   Leonides Grills, MD   Chief Complaint:  Vomiting  HPI:  74 year old male who  has a past medical history of HTN (hypertension); DM (diabetes mellitus) ([place]); Hypothyroidism; Hernia; Dizziness; AAA (abdominal aortic aneurysm) ([place]); Hyperlipidemia; Leg pain; Colon cancer ([place]); CAD (coronary artery disease); PAD (peripheral artery disease) (HCC); CHF (congestive heart failure) ([place]); COPD (chronic obstructive pulmonary disease) ([place]); Dementia; and Wheelchair bound. Today was brought to the hospital by patient's son for vomiting and altered mental status. Per son patient has a caregiver who reported that patient woke up screaming and began to vomit also had profuse diaphoresis. In the ED patient was found to be hypothermic with temperature 94.4. Bair hugger blanket was applied and temperature increased 97.7. Patient's mental status is back to baseline. Lactate 3.3 with potassium 3.0. Patient denies any chest pain or shortness of breath. Denies dysuria. Allergies:  No Known Allergies    Past Medical History  Diagnosis Date  . HTN (hypertension)   . DM (diabetes mellitus) ([place])   . Hypothyroidism   . Hernia   . Dizziness   . AAA (abdominal aortic aneurysm) ([place])     Dr. Scot Dock  . Hyperlipidemia   . Leg pain     with walking  . Colon cancer ([place])     adenocarcinoma; s/p R hemicolectomy 8/12  . CAD (coronary artery disease)     EF 57% by Myoview 4/12;  cath 5/12: dLM 50%, pLAD 50%, pCFX 50%, OM1 occluded with distal filling with L-L collats, mRCA 70-75%, dRCA 80%;  severe RCA needs PCI but treated medically due to need for colon surgery and plans to possilby perform PCI in future  . PAD (peripheral artery disease) (HCC)     c/b dry gangrene of toes  . CHF (congestive heart failure) ([place])   . COPD (chronic obstructive pulmonary disease) ([place])   . Dementia   . Wheelchair bound     Past Surgical History  Procedure Laterality Date  . Hernia repair    . Appendectomy      . Cardiac catheterization    . Colon surgery  12/12/10    r/t colon CA  . Abdominal surgery    . Abdominal aortic aneurysm repair  02/17/11    Open AAA repair and Right Fem-Pop  . Colonoscopy  03/14/2012    Procedure: COLONOSCOPY;  Surgeon: Rogene Houston, MD;  [facility];  Service: Endoscopy;  Laterality: N/A;  345-Ann to notify pt of new time    Prior to Admission medications   Medication Sig Start Date End Date Taking? Authorizing Provider  amLODipine (NORVASC) 5 MG tablet Take 5 mg by mouth daily.    Historical Provider, MD  aspirin EC 81 MG tablet Take 81 mg by mouth daily.      Historical Provider, MD  atenolol (TENORMIN) 50 MG tablet Take 50 mg by mouth daily.      Historical Provider, MD  azithromycin (ZITHROMAX) 250 MG tablet Take 1 tablet (250 mg total) by mouth daily. Take in the evening each day. 01/07/15   Samuella Cota, MD  cefUROXime (CEFTIN) 500 MG tablet Take 1 tablet (500 mg total) by mouth 2 (two) times daily with a meal. 01/07/15   Samuella Cota, MD  cephALEXin [facility]) 500 MG capsule 10 day course 04/21/15 04/21/15   Historical Provider, MD  donepezil (ARICEPT) 10 MG tablet Take 10 mg by mouth at bedtime.     Historical Provider, MD  gabapentin (NEURONTIN) 100  MG capsule Take 100 mg by mouth daily as needed (for neuropathy).  03/27/14   Historical Provider, MD  GENTAK 0.3 % ophthalmic ointment  04/21/15   Historical Provider, MD  HUMALOG 100 UNIT/ML injection Inject 3-5 Units into the skin 2 (two) times daily with a meal. Sliding scale 10/27/14   Historical Provider, MD  hydrocortisone cream 1 % Apply 1 application topically daily as needed (for irritation).    Historical Provider, MD  LANTUS 100 UNIT/ML injection Inject 15 Units into the skin at bedtime.  03/14/11   Historical Provider, MD  levothyroxine (SYNTHROID, LEVOTHROID) 175 MCG tablet Take 175 mcg by mouth daily.    Historical Provider, MD  loperamide (IMODIUM) 2 MG capsule Take 4 mg by mouth at  bedtime.     Historical Provider, MD  memantine (NAMENDA) 10 MG tablet Take 10 mg by mouth 2 (two) times daily.  12/06/14   Historical Provider, MD  nitroGLYCERIN (NITROSTAT) 0.4 MG SL tablet Place 1 tablet (0.4 mg total) under the tongue every 5 (five) minutes as needed for chest pain. 07/11/12   Josue Hector, MD  olmesartan (BENICAR) 20 MG tablet Take 20 mg by mouth 2 (two) times daily before a meal.     Historical Provider, MD  pravastatin (PRAVACHOL) 40 MG tablet Take 40 mg by mouth every morning.  07/08/12   Historical Provider, MD  Tolnaftate (ANTI-FUNGAL EX) Apply 1 application topically daily as needed (irritation).    Historical Provider, MD    Social History:  reports that he quit smoking about 4 years ago. His smoking use included Cigarettes. He has a 100 pack-year smoking history. He has never used smokeless tobacco. He reports that he does not drink alcohol or use illicit drugs.  Family History  Problem Relation Age of Onset  . Diabetes Mother   . Heart disease Mother     heart failure  . Heart attack Mother   . Heart disease Brother   . Other Brother 83    MRSA  . COPD Father   . Heart disease Father     heart failure  . COPD Sister     Danley Danker Weights   04/27/15 2135  Weight: 61.236 kg (135 lb)    All the positives are listed in BOLD  Review of Systems:  HEENT: Headache, blurred vision, runny nose, sore throat Neck: Hypothyroidism, hyperthyroidism,,lymphadenopathy Chest : Shortness of breath, history of COPD, Asthma Heart : Chest pain, history of coronary arterey disease GI:  Nausea, vomiting, diarrhea, constipation, GERD GU: Dysuria, urgency, frequency of urination, hematuria Neuro: Stroke, seizures, syncope Psych: Depression, anxiety, hallucinations   Physical Exam: Blood pressure 151/88, pulse 73, temperature 97.7 F (36.5 C), temperature source Oral, resp. rate 20, height 5\' 10"  (1.778 m), weight 61.236 kg (135 lb), SpO2 93 %. Constitutional:   Patient is  a frail-appearing elderly male in no acute distress and cooperative with exam. Head: Normocephalic and atraumatic Mouth: Mucus membranes moist Eyes: PERRL, EOMI, conjunctivae normal Neck: Supple, No Thyromegaly Cardiovascular: RRR, S1 normal, S2 normal Pulmonary/Chest: CTAB, no wheezes, rales, or rhonchi Abdominal: Soft. Non-tender, non-distended, bowel sounds are normal, no masses, organomegaly, or guarding present.  Neurological: A&O x3, Strength is normal and symmetric bilaterally, cranial nerve II-XII are grossly intact, no focal motor deficit, sensory intact to light touch bilaterally.  Extremities : No Cyanosis, Clubbing or Edema  Labs on Admission:  Basic Metabolic Panel:  Recent Labs Lab 04/27/15 2245  NA 140  K 3.0*  CL 104  CO2 24  GLUCOSE 288*  BUN 19  CREATININE 1.08  CALCIUM 9.1   Liver Function Tests:  Recent Labs Lab 04/27/15 2245  AST 26  ALT 16*  ALKPHOS 106  BILITOT 0.5  PROT 7.9  ALBUMIN 3.7    Recent Labs Lab 04/27/15 2245  LIPASE 27   No results for input(s): AMMONIA in the last 168 hours. CBC:  Recent Labs Lab 04/27/15 2245  WBC 12.7*  HGB 16.5  HCT 50.2  MCV 90.5  PLT 128*   Cardiac Enzymes:  Recent Labs Lab 04/27/15 2245  TROPONINI 0.03     CBG:  Recent Labs Lab 04/27/15 2152  GLUCAP 277*    Radiological Exams on Admission: Dg Chest 1 View  04/27/2015  CLINICAL DATA:  74 year old male with altered mental status. History of COPD and CHF EXAM: CHEST 1 VIEW COMPARISON:  Radiograph dated 01/05/2015 FINDINGS: Single-view of the chest demonstrates emphysematous changes of the lungs. No focal consolidation, pleural effusion, or pneumothorax. Stable cardiac silhouette. The osseous structures are grossly unremarkable. IMPRESSION: No active disease. Electronically Signed   By: Anner Crete M.D.   On: 04/27/2015 23:22   Ct Head Wo Contrast  04/27/2015  CLINICAL DATA:  Altered mental status, vomiting. EXAM: CT HEAD  WITHOUT CONTRAST TECHNIQUE: Contiguous axial images were obtained from the base of the skull through the vertex without intravenous contrast. COMPARISON:  MRI of the head Sep 11, 2014 FINDINGS: The ventricles and sulci are proportionally mildly prominent for age. Mild disproportionate cerebellar volume loss. No intraparenchymal hemorrhage, mass effect nor midline shift. Patchy to confluent supratentorial white matter hypodensities. No acute large vascular territory infarcts. Biparietal encephalomalacia. No abnormal extra-axial fluid collections. Basal cisterns are patent. Moderate calcific atherosclerosis of the carotid siphons and included vertebral arteries. No skull fracture. The included ocular globes and orbital contents are non-suspicious. The mastoid aircells and included paranasal sinuses are well-aerated. Soft tissue within the RIGHT external auditory canal compatible with cerumen. IMPRESSION: No acute intracranial process. Moderate to severe global brain atrophy with somewhat disproportionate cerebellar volume loss. Moderate to severe chronic small vessel ischemic disease. Biparietal encephalomalacia, present on prior CT examination. Electronically Signed   By: Elon Alas M.D.   On: 04/27/2015 23:48    EKG: Independently reviewed. Ectopic atrial rhythm   Assessment/Plan Active Problems:   CAD (coronary artery disease)   Diabetes type 2, uncontrolled (HCC)   Hypothermia   Dehydration   Hypokalemia  Nausea and vomiting Resolved at this time, likely viral Start Zofran when necessary for nausea vomiting. Continue IV fluids. Mild elevation of lactate likely from dehydration. We'll repeat lactic acid.  Hypokalemia Replace potassium and check BMP in a.m.  Hypothermia Patient came with temp of 94.4 now improved to  97.7 after applying warming blanket Continue to monitor.  Diabetes mellitus Continue Lantus, start sliding scale insulin with NovoLog.  DVT  prophylaxis Lovenox  Code status: Full code  Family discussion: Admission, patients condition and plan of care including tests being ordered have been discussed with the patient and *his son at bedside who indicate understanding and agree with the plan and Code Status.   Time Spent on Admission: 60 min   [facility] Pager: [phone] 04/28/2015, 1:09 AM  If 7PM-7AM, please contact night-coverage  [email or web address]  Password [email or web address]

## 2015-04-28 NOTE — Progress Notes (Signed)
Unable to complete admission history due to patient being confused and no family at the bedside. He was able to answer some things but others he was confused on. Will pass this on to the next shift to get information when family is available to answer questions.

## 2015-04-28 NOTE — Progress Notes (Addendum)
Patient has a lactic acid of 2.1. Paged hospitalist. No new orders received at this time.

## 2015-04-28 NOTE — Progress Notes (Signed)
Patient had 26 beats of vtach. Paged hospitalist. Received order for a cardiology consult. Also ordered a magnesium level. Will continue to monitor.

## 2015-04-28 NOTE — Consult Note (Signed)
Reason for Consult:   Wide complex tachycardia  Requesting Physician: Dr [place] Comment Primary Cardiologist Dr Johnsie Cancel  HPI:   74 y.o. male with a complex history. He has IDDM, HTN, COPD, h/o colon cancer, dementia, and chronic LBBB. He has CAD, s/p cath 5/12: dLM 50%, pLAD 50%, pCFX 50%, OM1 occluded with distal filling with L-L collats, mRCA 70-75%, dRCA 80%. EF then was 65%. He was treated medically. He has no history of CHF. He has dementia and lives with a 24 hr caregiver. He has severe PAD. He had AAA R&G and AOBP in 2013. He is wheelchair bound at home.           He was admitted 04/27/15 with acute delerium and hypothermia with a lactic acid of 3.3.  WBC is normal. CXR and UA unremarkable. He apparently had been on ABs as an OP for (?) URI.  Pt's caregiver reported that patient woke up screaming and began to vomit also had profuse diaphoresis.  After admission he had 25 bts of WCT.  His K+ on admission was 3.0. Troponin is negative. The pt denies any chest pain.   PMHx:  Past Medical History  Diagnosis Date  . HTN (hypertension)   . DM (diabetes mellitus) ([place])   . Hypothyroidism   . Hernia   . Dizziness   . AAA (abdominal aortic aneurysm) ([place])     Dr. Scot Dock  . Hyperlipidemia   . Leg pain     with walking  . Colon cancer ([place])     adenocarcinoma; s/p R hemicolectomy 8/12  . CAD (coronary artery disease)     EF 57% by Myoview 4/12;  cath 5/12: dLM 50%, pLAD 50%, pCFX 50%, OM1 occluded with distal filling with L-L collats, mRCA 70-75%, dRCA 80%;  severe RCA needs PCI but treated medically due to need for colon surgery and plans to possilby perform PCI in future  . PAD (peripheral artery disease) (HCC)     c/b dry gangrene of toes  . CHF (congestive heart failure) ([place])   . COPD (chronic obstructive pulmonary disease) ([place])   . Dementia   . Wheelchair bound     Past Surgical History  Procedure Laterality Date  . Hernia repair    . Appendectomy    . Cardiac  catheterization    . Colon surgery  12/12/10    r/t colon CA  . Abdominal surgery    . Abdominal aortic aneurysm repair  02/17/11    Open AAA repair and Right Fem-Pop  . Colonoscopy  03/14/2012    Procedure: COLONOSCOPY;  Surgeon: Rogene Houston, MD;  [facility];  Service: Endoscopy;  Laterality: N/A;  345-Ann to notify pt of new time    SOCHx:  reports that he quit smoking about 4 years ago. His smoking use included Cigarettes. He has a 100 pack-year smoking history. He has never used smokeless tobacco. He reports that he does not drink alcohol or use illicit drugs.  FAMHx: Family History  Problem Relation Age of Onset  . Diabetes Mother   . Heart disease Mother     heart failure  . Heart attack Mother   . Heart disease Brother   . Other Brother 21    MRSA  . COPD Father   . Heart disease Father     heart failure  . COPD Sister     ALLERGIES: No Known Allergies  ROS: Review of Systems: Unreliable secondary to  history of dementia  HOME MEDICATIONS: Prior to Admission medications   Medication Sig Start Date End Date Taking? Authorizing Provider  amLODipine (NORVASC) 5 MG tablet Take 5 mg by mouth daily.    Historical Provider, MD  aspirin EC 81 MG tablet Take 81 mg by mouth daily.      Historical Provider, MD  atenolol (TENORMIN) 50 MG tablet Take 50 mg by mouth daily.      Historical Provider, MD  azithromycin (ZITHROMAX) 250 MG tablet Take 1 tablet (250 mg total) by mouth daily. Take in the evening each day. 01/07/15   Samuella Cota, MD  cefUROXime (CEFTIN) 500 MG tablet Take 1 tablet (500 mg total) by mouth 2 (two) times daily with a meal. 01/07/15   Samuella Cota, MD  cephALEXin [facility]) 500 MG capsule 10 day course 04/21/15 04/21/15   Historical Provider, MD  donepezil (ARICEPT) 10 MG tablet Take 10 mg by mouth at bedtime.     Historical Provider, MD  gabapentin (NEURONTIN) 100 MG capsule Take 100 mg by mouth daily as needed (for neuropathy).  03/27/14    Historical Provider, MD  GENTAK 0.3 % ophthalmic ointment  04/21/15   Historical Provider, MD  HUMALOG 100 UNIT/ML injection Inject 3-5 Units into the skin 2 (two) times daily with a meal. Sliding scale 10/27/14   Historical Provider, MD  hydrocortisone cream 1 % Apply 1 application topically daily as needed (for irritation).    Historical Provider, MD  LANTUS 100 UNIT/ML injection Inject 15 Units into the skin at bedtime.  03/14/11   Historical Provider, MD  levothyroxine (SYNTHROID, LEVOTHROID) 175 MCG tablet Take 175 mcg by mouth daily.    Historical Provider, MD  loperamide (IMODIUM) 2 MG capsule Take 4 mg by mouth at bedtime.     Historical Provider, MD  memantine (NAMENDA) 10 MG tablet Take 10 mg by mouth 2 (two) times daily.  12/06/14   Historical Provider, MD  nitroGLYCERIN (NITROSTAT) 0.4 MG SL tablet Place 1 tablet (0.4 mg total) under the tongue every 5 (five) minutes as needed for chest pain. 07/11/12   Josue Hector, MD  olmesartan (BENICAR) 20 MG tablet Take 20 mg by mouth 2 (two) times daily before a meal.     Historical Provider, MD  pravastatin (PRAVACHOL) 40 MG tablet Take 40 mg by mouth every morning.  07/08/12   Historical Provider, MD  Tolnaftate (ANTI-FUNGAL EX) Apply 1 application topically daily as needed (irritation).    Historical Provider, MD    HOSPITAL MEDICATIONS: I have reviewed the patient's current medications.  VITALS: Blood pressure 147/66, pulse 81, temperature 98 F (36.7 C), temperature source Oral, resp. rate 20, height 5\' 10"  (1.778 m), weight 150 lb 2.1 oz (68.1 kg), SpO2 96 %.  PHYSICAL EXAM: General appearance: alert, cooperative and no distress Neck: no JVD Lungs: rhonchi on Lt Heart: regular rate and rhythm Abdomen: soft, non-tender; bowel sounds normal; no masses,  no organomegaly and mid line surgical scar Extremities: amputation of Rt 3d toe Pulses: diminnished Skin: cool plae dry Neurologic: Grossly normal  LABS: Results for orders placed  or performed during the hospital encounter of 04/27/15 (from the past 24 hour(s))  CBG monitoring, ED     Status: Abnormal   Collection Time: 04/27/15  9:52 PM  Result Value Ref Range   Glucose-Capillary 277 (H) 65 - 99 mg/dL  Urinalysis, Routine w reflex microscopic (not at [facility])     Status: Abnormal   Collection Time: 04/27/15  10:02 PM  Result Value Ref Range   Color, Urine YELLOW YELLOW   APPearance CLEAR CLEAR   Specific Gravity, Urine 1.025 1.005 - 1.030   pH 6.0 5.0 - 8.0   Glucose, UA 250 (A) NEGATIVE mg/dL   Hgb urine dipstick LARGE (A) NEGATIVE   Bilirubin Urine NEGATIVE NEGATIVE   Ketones, ur 15 (A) NEGATIVE mg/dL   Protein, ur TRACE (A) NEGATIVE mg/dL   Nitrite NEGATIVE NEGATIVE   Leukocytes, UA NEGATIVE NEGATIVE  Urine microscopic-add on     Status: Abnormal   Collection Time: 04/27/15 10:02 PM  Result Value Ref Range   Squamous Epithelial / LPF 0-5 (A) NONE SEEN   WBC, UA 0-5 0 - 5 WBC/hpf   RBC / HPF 6-30 0 - 5 RBC/hpf   Bacteria, UA RARE (A) NONE SEEN  Lipase, blood     Status: None   Collection Time: 04/27/15 10:45 PM  Result Value Ref Range   Lipase 27 11 - 51 U/L  Comprehensive metabolic panel     Status: Abnormal   Collection Time: 04/27/15 10:45 PM  Result Value Ref Range   Sodium 140 135 - 145 mmol/L   Potassium 3.0 (L) 3.5 - 5.1 mmol/L   Chloride 104 101 - 111 mmol/L   CO2 24 22 - 32 mmol/L   Glucose, Bld 288 (H) 65 - 99 mg/dL   BUN 19 6 - 20 mg/dL   Creatinine, Ser 1.08 0.61 - 1.24 mg/dL   Calcium 9.1 8.9 - 10.3 mg/dL   Total Protein 7.9 6.5 - 8.1 g/dL   Albumin 3.7 3.5 - 5.0 g/dL   AST 26 15 - 41 U/L   ALT 16 (L) 17 - 63 U/L   Alkaline Phosphatase 106 38 - 126 U/L   Total Bilirubin 0.5 0.3 - 1.2 mg/dL   GFR calc non Af Amer >60 >60 mL/min   GFR calc Af Amer >60 >60 mL/min   Anion gap 12 5 - 15  CBC     Status: Abnormal   Collection Time: 04/27/15 10:45 PM  Result Value Ref Range   WBC 12.7 (H) 4.0 - 10.5 K/uL   RBC 5.55 4.22 - 5.81 MIL/uL     Hemoglobin 16.5 13.0 - 17.0 g/dL   HCT 50.2 39.0 - 52.0 %   MCV 90.5 78.0 - 100.0 fL   MCH 29.7 26.0 - 34.0 pg   MCHC 32.9 30.0 - 36.0 g/dL   RDW 12.9 11.5 - 15.5 %   Platelets 128 (L) 150 - 400 K/uL  Troponin I     Status: None   Collection Time: 04/27/15 10:45 PM  Result Value Ref Range   Troponin I 0.03 <0.031 ng/mL  Blood culture (routine x 2)     Status: None (Preliminary result)   Collection Time: 04/27/15 10:45 PM  Result Value Ref Range   Specimen Description BLOOD LEFT ANTECUBITAL    Special Requests      BOTTLES DRAWN AEROBIC AND ANAEROBIC AEB 4CC ANA 2CC   Culture PENDING    Report Status PENDING   Lactic acid, plasma     Status: Abnormal   Collection Time: 04/27/15 10:45 PM  Result Value Ref Range   Lactic Acid, Venous 3.3 (HH) 0.5 - 2.0 mmol/L  Blood culture (routine x 2)     Status: None (Preliminary result)   Collection Time: 04/27/15 11:00 PM  Result Value Ref Range   Specimen Description BLOOD LEFT FOREARM    Special Requests BOTTLES DRAWN  AEROBIC AND ANAEROBIC 4CC EACH    Culture PENDING    Report Status PENDING   Lactic acid, plasma     Status: Abnormal   Collection Time: 04/28/15  1:07 AM  Result Value Ref Range   Lactic Acid, Venous 2.1 (HH) 0.5 - 2.0 mmol/L  Magnesium     Status: None   Collection Time: 04/28/15  1:07 AM  Result Value Ref Range   Magnesium 1.9 1.7 - 2.4 mg/dL  Glucose, capillary     Status: Abnormal   Collection Time: 04/28/15  2:21 AM  Result Value Ref Range   Glucose-Capillary 190 (H) 65 - 99 mg/dL   Comment 1 Notify RN    Comment 2 Document in Chart   CBC     Status: Abnormal   Collection Time: 04/28/15  5:37 AM  Result Value Ref Range   WBC 8.9 4.0 - 10.5 K/uL   RBC 5.22 4.22 - 5.81 MIL/uL   Hemoglobin 15.1 13.0 - 17.0 g/dL   HCT 47.0 39.0 - 52.0 %   MCV 90.0 78.0 - 100.0 fL   MCH 28.9 26.0 - 34.0 pg   MCHC 32.1 30.0 - 36.0 g/dL   RDW 12.9 11.5 - 15.5 %   Platelets 124 (L) 150 - 400 K/uL  Comprehensive metabolic  panel     Status: Abnormal   Collection Time: 04/28/15  5:37 AM  Result Value Ref Range   Sodium 144 135 - 145 mmol/L   Potassium 4.2 3.5 - 5.1 mmol/L   Chloride 110 101 - 111 mmol/L   CO2 27 22 - 32 mmol/L   Glucose, Bld 179 (H) 65 - 99 mg/dL   BUN 19 6 - 20 mg/dL   Creatinine, Ser 0.95 0.61 - 1.24 mg/dL   Calcium 8.6 (L) 8.9 - 10.3 mg/dL   Total Protein 6.7 6.5 - 8.1 g/dL   Albumin 3.1 (L) 3.5 - 5.0 g/dL   AST 20 15 - 41 U/L   ALT 14 (L) 17 - 63 U/L   Alkaline Phosphatase 85 38 - 126 U/L   Total Bilirubin 0.8 0.3 - 1.2 mg/dL   GFR calc non Af Amer >60 >60 mL/min   GFR calc Af Amer >60 >60 mL/min   Anion gap 7 5 - 15  Glucose, capillary     Status: Abnormal   Collection Time: 04/28/15  7:52 AM  Result Value Ref Range   Glucose-Capillary 139 (H) 65 - 99 mg/dL    EKG: NSR, LBBB  IMAGING: Dg Chest 1 View  04/27/2015  CLINICAL DATA:  74 year old male with altered mental status. History of COPD and CHF EXAM: CHEST 1 VIEW COMPARISON:  Radiograph dated 01/05/2015 FINDINGS: Single-view of the chest demonstrates emphysematous changes of the lungs. No focal consolidation, pleural effusion, or pneumothorax. Stable cardiac silhouette. The osseous structures are grossly unremarkable. IMPRESSION: No active disease. Electronically Signed   By: Anner Crete M.D.   On: 04/27/2015 23:22   Ct Head Wo Contrast  04/27/2015  CLINICAL DATA:  Altered mental status, vomiting. EXAM: CT HEAD WITHOUT CONTRAST TECHNIQUE: Contiguous axial images were obtained from the base of the skull through the vertex without intravenous contrast. COMPARISON:  MRI of the head Sep 11, 2014 FINDINGS: The ventricles and sulci are proportionally mildly prominent for age. Mild disproportionate cerebellar volume loss. No intraparenchymal hemorrhage, mass effect nor midline shift. Patchy to confluent supratentorial white matter hypodensities. No acute large vascular territory infarcts. Biparietal encephalomalacia. No abnormal  extra-axial fluid collections. Basal  cisterns are patent. Moderate calcific atherosclerosis of the carotid siphons and included vertebral arteries. No skull fracture. The included ocular globes and orbital contents are non-suspicious. The mastoid aircells and included paranasal sinuses are well-aerated. Soft tissue within the RIGHT external auditory canal compatible with cerumen. IMPRESSION: No acute intracranial process. Moderate to severe global brain atrophy with somewhat disproportionate cerebellar volume loss. Moderate to severe chronic small vessel ischemic disease. Biparietal encephalomalacia, present on prior CT examination. Electronically Signed   By: Elon Alas M.D.   On: 04/27/2015 23:48    IMPRESSION: Principal Problem:   Delirium, acute- ? sepsis Active Problems:   Wide-complex tachycardia (HCC)   CAD (coronary artery disease)   Diabetes type 2, uncontrolled (HCC)   Hypothermia   HTN (hypertension)   PVD (peripheral vascular disease) (HCC)   Hypokalemia   Dementia   RECOMMENDATION: WCT could be PSVT with LBBB, it does appear to be irregularly irregular. His LVF was normal in 2012 but I could find no assessment since. Will check 2D echo. Keep K+ close to 4.0, document TSH, continue Tenormin  Time Spent Directly with Patient: 40 minutes  Kerin Ransom, [place] beeper 04/28/2015, 8:35 AM   Pt seen and examined  Agree with findings of L Kilroy above  I agree with 2 D echo to define LVEF  COntinue telemetry    [employer]

## 2015-04-28 NOTE — Progress Notes (Signed)
Late entry:  Per patient's caregiver, patient hit right hand on bed rail.  Small skin tear to right hand at knuckle on ring finger.  Allevyn applied.  Patient voiced no complaints.

## 2015-04-28 NOTE — Progress Notes (Signed)
[facility] PROGRESS NOTE  Peter Harvey [id number] DOB: [birth year]    PCP:   Leonides Grills, MD   HPI:   74 year old male who  has a past medical history of HTN (hypertension); DM (diabetes mellitus) ([place]); Hypothyroidism; Hernia; Dizziness; AAA (abdominal aortic aneurysm) ([place]); Hyperlipidemia; Leg pain; Colon cancer ([place]); CAD (coronary artery disease); PAD (peripheral artery disease) (HCC); CHF (congestive heart failure) ([place]); COPD (chronic obstructive pulmonary disease) ([place]); Dementia; and Wheelchair bound.  brought to the hospital by patient's son for vomiting and altered mental status. Per son patient has a caregiver who reported that patient woke up screaming and began to vomit also had profuse diaphoresis. In the ED patient was found to be hypothermic with temperature 94.4. Bair hugger blanket was applied and temperature increased 97.7. His . Lactate 3.3 with potassium 3.0. Patient denies any chest pain or shortness of breath. Denies dysuria. Since admission, he is a little confused, and he was seen by Cardiology.  ECHO is being done and Dr Harrington Challenger believe [facility] may be PSVT with LBBB. She recommended TSH and Continue with betablocker.   Rewiew of Systems: Unable.   Past Medical History  Diagnosis Date  . HTN (hypertension)   . DM (diabetes mellitus) ([place])   . Hypothyroidism   . Hernia   . Dizziness   . AAA (abdominal aortic aneurysm) ([place])     Dr. Scot Dock  . Hyperlipidemia   . Leg pain     with walking  . Colon cancer ([place])     adenocarcinoma; s/p R hemicolectomy 8/12  . CAD (coronary artery disease)     EF 57% by Myoview 4/12;  cath 5/12: dLM 50%, pLAD 50%, pCFX 50%, OM1 occluded with distal filling with L-L collats, mRCA 70-75%, dRCA 80%;  severe RCA needs PCI but treated medically due to need for colon surgery and plans to possilby perform PCI in future  . PAD (peripheral artery disease) (HCC)     c/b dry gangrene of toes  . CHF (congestive heart failure)  ([place])   . COPD (chronic obstructive pulmonary disease) ([place])   . Dementia   . Wheelchair bound     Past Surgical History  Procedure Laterality Date  . Hernia repair    . Appendectomy    . Cardiac catheterization    . Colon surgery  12/12/10    r/t colon CA  . Abdominal surgery    . Abdominal aortic aneurysm repair  02/17/11    Open AAA repair and Right Fem-Pop  . Colonoscopy  03/14/2012    Procedure: COLONOSCOPY;  Surgeon: Rogene Houston, MD;  Location: [facility];  Service: Endoscopy;  Laterality: N/A;  345-Ann to notify pt of new time    Medications:  HOME MEDS: Prior to Admission medications   Medication Sig Start Date End Date Taking? Authorizing Provider  amLODipine (NORVASC) 5 MG tablet Take 5 mg by mouth daily.   Yes Historical Provider, MD  aspirin EC 81 MG tablet Take 81 mg by mouth daily.     Yes Historical Provider, MD  atenolol (TENORMIN) 50 MG tablet Take 50 mg by mouth daily.     Yes Historical Provider, MD  cephALEXin (KEFLEX) 500 MG capsule Take 500 mg by mouth 4 (four) times daily. 10 day course 04/21/15 04/21/15  Yes Historical Provider, MD  donepezil (ARICEPT) 10 MG tablet Take 10 mg by mouth at bedtime.    Yes Historical Provider, MD  gabapentin (NEURONTIN) 100 MG capsule Take 100 mg by  mouth daily as needed (for neuropathy).  03/27/14  Yes Historical Provider, MD  HUMALOG 100 UNIT/ML injection Inject 3-5 Units into the skin 2 (two) times daily with a meal. Sliding scale 10/27/14  Yes Historical Provider, MD  hydrocortisone cream 1 % Apply 1 application topically daily as needed (for irritation).   Yes Historical Provider, MD  LANTUS 100 UNIT/ML injection Inject 15 Units into the skin at bedtime.  03/14/11  Yes Historical Provider, MD  levothyroxine (SYNTHROID, LEVOTHROID) 175 MCG tablet Take 175 mcg by mouth daily.   Yes Historical Provider, MD  memantine (NAMENDA) 10 MG tablet Take 10 mg by mouth 2 (two) times daily.  12/06/14  Yes Historical Provider, MD   nitroGLYCERIN (NITROSTAT) 0.4 MG SL tablet Place 1 tablet (0.4 mg total) under the tongue every 5 (five) minutes as needed for chest pain. 07/11/12  Yes Josue Hector, MD  olmesartan (BENICAR) 20 MG tablet Take 20 mg by mouth 2 (two) times daily before a meal.    Yes Historical Provider, MD  pravastatin (PRAVACHOL) 40 MG tablet Take 40 mg by mouth every morning.  07/08/12  Yes Historical Provider, MD  Tolnaftate (ANTI-FUNGAL EX) Apply 1 application topically daily as needed (irritation).   Yes Historical Provider, MD     Allergies:  No Known Allergies  Social History:   reports that he quit smoking about 4 years ago. His smoking use included Cigarettes. He has a 100 pack-year smoking history. He has never used smokeless tobacco. He reports that he does not drink alcohol or use illicit drugs.  Family History: Family History  Problem Relation Age of Onset  . Diabetes Mother   . Heart disease Mother     heart failure  . Heart attack Mother   . Heart disease Brother   . Other Brother 23    MRSA  . COPD Father   . Heart disease Father     heart failure  . COPD Sister      Physical Exam: Filed Vitals:   04/28/15 0207 04/28/15 0543 04/28/15 1144 04/28/15 1357  BP: 118/56 147/66  180/86  Pulse: 61 81 68 64  Temp: 98.1 F (36.7 C) 98 F (36.7 C)  97.9 F (36.6 C)  TempSrc: Oral Oral  Oral  Resp: 20 20  18   Height:      Weight:      SpO2: 94% 96%  98%   GEN:  Pleasant  patient lying in the stretcher in no acute distress; cooperative with exam. PSYCH:  alert and oriented x4; does not appear anxious or depressed; affect is appropriate. HEENT: Mucous membranes pink and anicteric; PERRLA; EOM intact; no cervical lymphadenopathy nor thyromegaly or carotid bruit; no JVD; There were no stridor. Neck is very supple. Breasts:: Not examined CHEST WALL: No tenderness CHEST: Normal respiration, clear to auscultation bilaterally.  HEART: Regular rate and rhythm.  There are no murmur, rub,  or gallops.   BACK: No kyphosis or scoliosis; no CVA tenderness ABDOMEN: soft and non-tender; no masses, no organomegaly, normal abdominal bowel sounds; no pannus; no intertriginous candida. There is no rebound and no distention. Rectal Exam: Not done EXTREMITIES: No bone or joint deformity; age-appropriate arthropathy of the hands and knees; no edema; no ulcerations.  There is no calf tenderness. Genitalia: not examined PULSES: 2+ and symmetric SKIN: Normal hydration no rash or ulceration CNS: Cranial nerves 2-12 grossly intact no focal lateralizing neurologic deficit.  Speech is fluent; uvula elevated with phonation, facial symmetry and tongue  midline. DTR are normal bilaterally, cerebella exam is intact, barbinski is negative and strengths are equaled bilaterally.  No sensory loss.   Labs on Admission:  Basic Metabolic Panel:  Recent Labs Lab 04/27/15 2245 04/28/15 0107 04/28/15 0537  NA 140  --  144  K 3.0*  --  4.2  CL 104  --  110  CO2 24  --  27  GLUCOSE 288*  --  179*  BUN 19  --  19  CREATININE 1.08  --  0.95  CALCIUM 9.1  --  8.6*  MG  --  1.9  --    Liver Function Tests:  Recent Labs Lab 04/27/15 2245 04/28/15 0537  AST 26 20  ALT 16* 14*  ALKPHOS 106 85  BILITOT 0.5 0.8  PROT 7.9 6.7  ALBUMIN 3.7 3.1*    Recent Labs Lab 04/27/15 2245  LIPASE 27   CBC:  Recent Labs Lab 04/27/15 2245 04/28/15 0537  WBC 12.7* 8.9  HGB 16.5 15.1  HCT 50.2 47.0  MCV 90.5 90.0  PLT 128* 124*   Cardiac Enzymes:  Recent Labs Lab 04/27/15 2245  TROPONINI 0.03    CBG:  Recent Labs Lab 04/27/15 2152 04/28/15 0221 04/28/15 0752 04/28/15 1156  GLUCAP 277* 190* 139* 106*     Radiological Exams on Admission: Dg Chest 1 View  04/27/2015  CLINICAL DATA:  74 year old male with altered mental status. History of COPD and CHF EXAM: CHEST 1 VIEW COMPARISON:  Radiograph dated 01/05/2015 FINDINGS: Single-view of the chest demonstrates emphysematous changes of the  lungs. No focal consolidation, pleural effusion, or pneumothorax. Stable cardiac silhouette. The osseous structures are grossly unremarkable. IMPRESSION: No active disease. Electronically Signed   By: Anner Crete M.D.   On: 04/27/2015 23:22   Ct Head Wo Contrast  04/27/2015  CLINICAL DATA:  Altered mental status, vomiting. EXAM: CT HEAD WITHOUT CONTRAST TECHNIQUE: Contiguous axial images were obtained from the base of the skull through the vertex without intravenous contrast. COMPARISON:  MRI of the head Sep 11, 2014 FINDINGS: The ventricles and sulci are proportionally mildly prominent for age. Mild disproportionate cerebellar volume loss. No intraparenchymal hemorrhage, mass effect nor midline shift. Patchy to confluent supratentorial white matter hypodensities. No acute large vascular territory infarcts. Biparietal encephalomalacia. No abnormal extra-axial fluid collections. Basal cisterns are patent. Moderate calcific atherosclerosis of the carotid siphons and included vertebral arteries. No skull fracture. The included ocular globes and orbital contents are non-suspicious. The mastoid aircells and included paranasal sinuses are well-aerated. Soft tissue within the RIGHT external auditory canal compatible with cerumen. IMPRESSION: No acute intracranial process. Moderate to severe global brain atrophy with somewhat disproportionate cerebellar volume loss. Moderate to severe chronic small vessel ischemic disease. Biparietal encephalomalacia, present on prior CT examination. Electronically Signed   By: Elon Alas M.D.   On: 04/27/2015 23:48   Assessment/Plan Present on Admission:  . Hypothermia . Diabetes type 2, uncontrolled ([place]) . CAD (coronary artery disease) . Hypokalemia . PVD (peripheral vascular disease) ([place]) . HTN (hypertension) . Delirium, acute . Dementia . LBBB (left bundle branch block)  PLAN:  WCT:  Keep K normal.  ? PSVT with LBBB.   Nausea and vomiting Resolved at  this time, likely viral Start Zofran when necessary for nausea vomiting. Continue IV fluids. Mild elevation of lactate likely from dehydration. We'll repeat lactic acid.  Hypokalemia Replace potassium and check BMP in a.m. Will give daily supplement.   Hypothermia Patient came with temp of 94.4 now improved  to 97.7 after applying warming blanket Continue to monitor.  Diabetes mellitus Continue Lantus, start sliding scale insulin with NovoLog.  DVT prophylaxis Lovenox  Code status: Full code   Orvan Falconer, MD.  FACP [facility] Pager [phone] 7pm to 7am.  04/28/2015, 4:24 PM

## 2015-04-28 NOTE — ED Notes (Signed)
MD at bedside. 

## 2015-04-28 NOTE — Care Management Note (Signed)
Case Management Note  Patient Details  Name: Peter Harvey MRN: [id number] Date of Birth: Apr 11, 1941  Subjective/Objective:                  Pt admitted from home with hypothermia and nausea, vomiting, Pt lives at home and has 24 hour caregiver at the bedside. Pt also has 2 sons who are very active in the care of the pt. Pt has a cane, walker, and w/c for home use but functions out of the w/c.  Action/Plan: Pt would now like hospital bed as pt does not walk. Will arrange with [facility] per pts choice. Romualdo Bolk of [facility] is aware of the referral and will collect the pts information from the chart. No further CM needs noted.  Expected Discharge Date:                  Expected Discharge Plan:  Home/Self Care  In-House Referral:  NA  Discharge planning Services  CM Consult  Post Acute Care Choice:  Durable Medical Equipment Choice offered to:  Adult Children  DME Arranged:  Hospital bed DME Agency:  [place]:    [place] Agency:     Status of Service:  Completed, signed off  Medicare Important Message Given:    Date Medicare IM Given:    Medicare IM give by:    Date Additional Medicare IM Given:    Additional Medicare Important Message give by:     If discussed at [place] of Stay Meetings, dates discussed:    Additional Comments:  Joylene Draft, RN 04/28/2015, 11:11 AM

## 2015-04-29 DIAGNOSIS — T68XXXA Hypothermia, initial encounter: Secondary | ICD-10-CM | POA: Diagnosis not present

## 2015-04-29 DIAGNOSIS — E1165 Type 2 diabetes mellitus with hyperglycemia: Secondary | ICD-10-CM | POA: Diagnosis not present

## 2015-04-29 DIAGNOSIS — R112 Nausea with vomiting, unspecified: Secondary | ICD-10-CM | POA: Diagnosis not present

## 2015-04-29 DIAGNOSIS — E876 Hypokalemia: Secondary | ICD-10-CM | POA: Diagnosis not present

## 2015-04-29 DIAGNOSIS — I10 Essential (primary) hypertension: Secondary | ICD-10-CM | POA: Diagnosis not present

## 2015-04-29 DIAGNOSIS — R410 Disorientation, unspecified: Secondary | ICD-10-CM | POA: Diagnosis not present

## 2015-04-29 DIAGNOSIS — I2510 Atherosclerotic heart disease of native coronary artery without angina pectoris: Secondary | ICD-10-CM | POA: Diagnosis not present

## 2015-04-29 DIAGNOSIS — E119 Type 2 diabetes mellitus without complications: Secondary | ICD-10-CM | POA: Diagnosis not present

## 2015-04-29 LAB — GLUCOSE, CAPILLARY
GLUCOSE-CAPILLARY: 145 mg/dL — AB (ref 65–99)
GLUCOSE-CAPILLARY: 170 mg/dL — AB (ref 65–99)
Glucose-Capillary: 85 mg/dL (ref 65–99)
Glucose-Capillary: 104 mg/dL — ABNORMAL HIGH (ref 65–99)

## 2015-04-29 LAB — BASIC METABOLIC PANEL
ANION GAP: 9 (ref 5–15)
BUN: 12 mg/dL (ref 6–20)
CALCIUM: 9.1 mg/dL (ref 8.9–10.3)
CO2: 27 mmol/L (ref 22–32)
CREATININE: 0.95 mg/dL (ref 0.61–1.24)
Chloride: 105 mmol/L (ref 101–111)
GLUCOSE: 99 mg/dL (ref 65–99)
Potassium: 4.1 mmol/L (ref 3.5–5.1)
Sodium: 141 mmol/L (ref 135–145)

## 2015-04-29 MED ORDER — ACETAMINOPHEN 325 MG PO TABS
650.0000 mg | ORAL_TABLET | Freq: Four times a day (QID) | ORAL | Status: DC | PRN
  Administered 2015-04-29: 650 mg via ORAL
  Filled 2015-04-29: qty 2

## 2015-04-29 MED ORDER — LEVOTHYROXINE SODIUM 75 MCG PO TABS
150.0000 ug | ORAL_TABLET | Freq: Every day | ORAL | Status: DC
  Administered 2015-04-30: 150 ug via ORAL
  Filled 2015-04-29: qty 2

## 2015-04-29 NOTE — Progress Notes (Addendum)
Physical Therapy Treatment Patient Details Name: Peter Harvey MRN: [id number] DOB: [birth year] Today's Date: 04/29/2015    History of Present Illness 74 year old male who  has a past medical history of HTN (hypertension); DM (diabetes mellitus) ([place]); Hypothyroidism; Hernia; Dizziness; AAA (abdominal aortic aneurysm) ([place]); Hyperlipidemia; Leg pain; Colon cancer ([place]); CAD (coronary artery disease); PAD (peripheral artery disease) (HCC); CHF (congestive heart failure) ([place]); COPD (chronic obstructive pulmonary disease) ([place]); Dementia; and Wheelchair bound.    PT Comments    Pt caregiver does not want pt to be disturbed but states that she lives with pt 24/7.  Caregiver does all homemaking and transfers pt from bed to wheelchair as well as to commode.  Pt has not walked in years and caregiver states home is totally handicap accessible and will not need anything at discharge.   Follow Up Recommendations   will see pt tomorrow to ensure pt is able to transfer as previously but pt anticipate not need of skilled services.      Equipment Recommendations    none   Recommendations for Other Services  none     Precautions / Restrictions Precautions Precautions: None Restrictions Weight Bearing Restrictions: No                                                     Pertinent Vitals/Pain Pain Assessment:  (Pt asleep and caregiver does not want therapist to disturb )    Home Living Family/patient expects to be discharged to:: Private residence Living Arrangements: Alone Available Help at Discharge: Personal care attendant (ex-daughter in-law lives with pt 24/7) Type of Home: House Home Access: Level entry   Home Layout: One level Home Equipment: Toilet riser;Grab bars - toilet;Wheelchair - manual      Prior Function Level of Independence: Needs assistance  Gait / Transfers Assistance Needed: transfers with assistance is wheelchair bound ADL's / Homemaking  Assistance Needed: caregiver states she does all homemaking     PT Goals (current goals can now be found in the care plan section)                        Rayetta Humphrey, PT CLT [phone] 04/29/2015, 11:32 AM

## 2015-04-29 NOTE — Progress Notes (Addendum)
[facility] PROGRESS NOTE   Peter Harvey [id number] DOB: Jun 07, 1940 DOA: 04/27/2015 PCP: Leonides Grills, MD  HPI/Subjective: Seen with his son at bedside, still nauseous but he wants to eat. No vomiting since yesterday. Can probably be discharged home when tolerates food.  Assessment/Plan: Principal Problem:   Delirium, acute Active Problems:   HTN (hypertension)   CAD (coronary artery disease)   PVD (peripheral vascular disease) (HCC)   Diabetes type 2, uncontrolled (HCC)   Hypothermia   Hypokalemia   Wide-complex tachycardia (HCC)   Dementia   LBBB (left bundle branch block)   Nausea and vomiting Resolved at this time, likely viral, still nauseous, I have asked him to take his Zofran prior to eating. Start Zofran when necessary for nausea vomiting. Continue IV fluids. Mild elevation of lactate likely from dehydration. We'll repeat lactic acid.  Tachycardia, wide complex Patient has known left bundle branch block since August 2016,? PSVT. Seen by cardiology, recommended to keep potassium above 4. TSH is low at 0.045 will decrease the Synthroid dose.  Hypothyroidism Patient takes 175 g of Synthroid, TSH is 0.045, will decrease Synthroid to 150 g. Check TSH in 3-4 weeks.  Hypokalemia This is repleted with oral supplements, likely secondary to vomiting.  Hypothermia Patient came with temp of 94.4 now improved to 97.7 after applying warming blanket Continue to monitor.  Diabetes mellitus Continue Lantus, start sliding scale insulin with NovoLog.  DVT prophylaxis Lovenox   Code Status: Full Code Family Communication: Plan discussed with the patient. Disposition Plan: Remains inpatient Diet: Diet Carb Modified Fluid consistency:: Thin; Room service appropriate?: Yes  Consultants:  None  Procedures:  None  Antibiotics:  None   Objective: Filed Vitals:   04/28/15 2048 04/29/15 0637  BP: 180/81 157/70  Pulse: 66 67  Temp:  97.9 F (36.6 C) 98 F (36.7 C)  Resp: 20 20    Intake/Output Summary (Last 24 hours) at 04/29/15 1108 Last data filed at 04/29/15 [id number]  Gross per 24 hour  Intake    120 ml  Output   1000 ml  Net   -880 ml   Filed Weights   04/27/15 2135 04/28/15 0200  Weight: 61.236 kg (135 lb) 68.1 kg (150 lb 2.1 oz)    Exam: General: Alert and awake, oriented x3, not in any acute distress. HEENT: anicteric sclera, pupils reactive to light and accommodation, EOMI CVS: S1-S2 clear, no murmur rubs or gallops Chest: clear to auscultation bilaterally, no wheezing, rales or rhonchi Abdomen: soft nontender, nondistended, normal bowel sounds, no organomegaly Extremities: no cyanosis, clubbing or edema noted bilaterally Neuro: Cranial nerves II-XII intact, no focal neurological deficits  Data Reviewed: Basic Metabolic Panel:  Recent Labs Lab 04/27/15 2245 04/28/15 0107 04/28/15 0537 04/29/15 0646  NA 140  --  144 141  K 3.0*  --  4.2 4.1  CL 104  --  110 105  CO2 24  --  27 27  GLUCOSE 288*  --  179* 99  BUN 19  --  19 12  CREATININE 1.08  --  0.95 0.95  CALCIUM 9.1  --  8.6* 9.1  MG  --  1.9  --   --    Liver Function Tests:  Recent Labs Lab 04/27/15 2245 04/28/15 0537  AST 26 20  ALT 16* 14*  ALKPHOS 106 85  BILITOT 0.5 0.8  PROT 7.9 6.7  ALBUMIN 3.7 3.1*    Recent Labs Lab 04/27/15 2245  LIPASE 27   No results for  input(s): AMMONIA in the last 168 hours. CBC:  Recent Labs Lab 04/27/15 2245 04/28/15 0537  WBC 12.7* 8.9  HGB 16.5 15.1  HCT 50.2 47.0  MCV 90.5 90.0  PLT 128* 124*   Cardiac Enzymes:  Recent Labs Lab 04/27/15 2245  TROPONINI 0.03   BNP (last 3 results) No results for input(s): BNP in the last 8760 hours.  ProBNP (last 3 results) No results for input(s): PROBNP in the last 8760 hours.  CBG:  Recent Labs Lab 04/28/15 0752 04/28/15 1156 04/28/15 1631 04/28/15 2046 04/29/15 0719  GLUCAP 139* 106* 114* 124* 85    Micro Recent  Results (from the past 240 hour(s))  Blood culture (routine x 2)     Status: None (Preliminary result)   Collection Time: 04/27/15 10:45 PM  Result Value Ref Range Status   Specimen Description BLOOD LEFT ANTECUBITAL  Final   Special Requests   Final    BOTTLES DRAWN AEROBIC AND ANAEROBIC AEB 4CC ANA 2CC   Culture NO GROWTH 2 DAYS  Final   Report Status PENDING  Incomplete  Blood culture (routine x 2)     Status: None (Preliminary result)   Collection Time: 04/27/15 11:00 PM  Result Value Ref Range Status   Specimen Description BLOOD LEFT FOREARM  Final   Special Requests BOTTLES DRAWN AEROBIC AND ANAEROBIC 4CC EACH  Final   Culture NO GROWTH 2 DAYS  Final   Report Status PENDING  Incomplete     Studies: Dg Chest 1 View  04/27/2015  CLINICAL DATA:  74 year old male with altered mental status. History of COPD and CHF EXAM: CHEST 1 VIEW COMPARISON:  Radiograph dated 01/05/2015 FINDINGS: Single-view of the chest demonstrates emphysematous changes of the lungs. No focal consolidation, pleural effusion, or pneumothorax. Stable cardiac silhouette. The osseous structures are grossly unremarkable. IMPRESSION: No active disease. Electronically Signed   By: Anner Crete M.D.   On: 04/27/2015 23:22   Ct Head Wo Contrast  04/27/2015  CLINICAL DATA:  Altered mental status, vomiting. EXAM: CT HEAD WITHOUT CONTRAST TECHNIQUE: Contiguous axial images were obtained from the base of the skull through the vertex without intravenous contrast. COMPARISON:  MRI of the head Sep 11, 2014 FINDINGS: The ventricles and sulci are proportionally mildly prominent for age. Mild disproportionate cerebellar volume loss. No intraparenchymal hemorrhage, mass effect nor midline shift. Patchy to confluent supratentorial white matter hypodensities. No acute large vascular territory infarcts. Biparietal encephalomalacia. No abnormal extra-axial fluid collections. Basal cisterns are patent. Moderate calcific atherosclerosis of  the carotid siphons and included vertebral arteries. No skull fracture. The included ocular globes and orbital contents are non-suspicious. The mastoid aircells and included paranasal sinuses are well-aerated. Soft tissue within the RIGHT external auditory canal compatible with cerumen. IMPRESSION: No acute intracranial process. Moderate to severe global brain atrophy with somewhat disproportionate cerebellar volume loss. Moderate to severe chronic small vessel ischemic disease. Biparietal encephalomalacia, present on prior CT examination. Electronically Signed   By: Elon Alas M.D.   On: 04/27/2015 23:48    Scheduled Meds: . amLODipine  5 mg Oral Daily  . aspirin EC  81 mg Oral Daily  . atenolol  50 mg Oral Daily  . donepezil  10 mg Oral QHS  . enoxaparin (LOVENOX) injection  40 mg Subcutaneous Q24H  . insulin glargine  15 Units Subcutaneous QHS  . irbesartan  150 mg Oral Daily  . levothyroxine  175 mcg Oral QAC breakfast  . memantine  10 mg Oral BID  .  potassium chloride  20 mEq Oral Daily  . pravastatin  40 mg Oral q morning - 10a   Continuous Infusions: . 0.9 % NaCl with KCl 20 mEq / L 75 mL/hr at 04/28/15 1949       Time spent: 35 minutes    [facility] A  [facility] Pager [phone] If 7PM-7AM, please contact night-coverage at [email or web address], password [facility] 04/29/2015, 11:08 AM

## 2015-04-30 DIAGNOSIS — I10 Essential (primary) hypertension: Secondary | ICD-10-CM

## 2015-04-30 DIAGNOSIS — I739 Peripheral vascular disease, unspecified: Secondary | ICD-10-CM

## 2015-04-30 DIAGNOSIS — E876 Hypokalemia: Secondary | ICD-10-CM | POA: Diagnosis not present

## 2015-04-30 DIAGNOSIS — F0391 Unspecified dementia with behavioral disturbance: Secondary | ICD-10-CM | POA: Diagnosis not present

## 2015-04-30 DIAGNOSIS — R410 Disorientation, unspecified: Secondary | ICD-10-CM | POA: Diagnosis not present

## 2015-04-30 LAB — BASIC METABOLIC PANEL
Anion gap: 9 (ref 5–15)
BUN: 23 mg/dL — ABNORMAL HIGH (ref 6–20)
CALCIUM: 8.8 mg/dL — AB (ref 8.9–10.3)
CHLORIDE: 102 mmol/L (ref 101–111)
CO2: 27 mmol/L (ref 22–32)
CREATININE: 1.24 mg/dL (ref 0.61–1.24)
GFR calc Af Amer: >60 mL/min (ref >60)
GFR calc non Af Amer: 56 mL/min — ABNORMAL LOW (ref >60)
GLUCOSE: 198 mg/dL — AB (ref 65–99)
Potassium: 4.7 mmol/L (ref 3.5–5.1)
Sodium: 138 mmol/L (ref 135–145)

## 2015-04-30 LAB — LACTIC ACID, PLASMA: Lactic Acid, Venous: 1.5 mmol/L (ref 0.5–2.0)

## 2015-04-30 LAB — GLUCOSE, CAPILLARY: Glucose-Capillary: 200 mg/dL — ABNORMAL HIGH (ref 65–99)

## 2015-04-30 MED ORDER — LEVOTHYROXINE SODIUM 150 MCG PO TABS: 150.0000 ug | ORAL_TABLET | Freq: Every day | ORAL | Status: AC

## 2015-04-30 MED ORDER — POTASSIUM CHLORIDE CRYS ER 20 MEQ PO TBCR: 20.0000 meq | EXTENDED_RELEASE_TABLET | Freq: Every day | ORAL | Status: AC

## 2015-04-30 NOTE — Progress Notes (Signed)
Discharged to home with family  All instructions reviewed with patient

## 2015-04-30 NOTE — Discharge Summary (Signed)
Physician Discharge Summary  Peter Harvey [id number] DOB: Oct 04, 1940 DOA: 04/27/2015  PCP: Leonides Grills, MD  Admit date: 04/27/2015 Discharge date: 04/30/2015  Time spent: 35 minutes  Recommendations for Outpatient Follow-up:  1. Follow up with PCP in one week.    Discharge Diagnoses:  Principal Problem:   Delirium, acute Active Problems:   HTN (hypertension)   CAD (coronary artery disease)   PVD (peripheral vascular disease) (HCC)   Diabetes type 2, uncontrolled (HCC)   Hypothermia   Hypokalemia   Wide-complex tachycardia (HCC)   Dementia   LBBB (left bundle branch block)   Discharge Condition: much improved, but still confused and has FTT.   Diet recommendation: soft mechanical diet. Avoid simple sweets.  Filed Weights   04/27/15 2135 04/28/15 0200  Weight: 61.236 kg (135 lb) 68.1 kg (150 lb 2.1 oz)    History of present illness:  Patient was admitted for nausea and vomiting by Dr Darrick Meigs on 04/28/15.  As per his H and P:  " 74 year old male who  has a past medical history of HTN (hypertension); DM (diabetes mellitus) ([place]); Hypothyroidism; Hernia; Dizziness; AAA (abdominal aortic aneurysm) ([place]); Hyperlipidemia; Leg pain; Colon cancer ([place]); CAD (coronary artery disease); PAD (peripheral artery disease) (HCC); CHF (congestive heart failure) ([place]); COPD (chronic obstructive pulmonary disease) ([place]); Dementia; and Wheelchair bound. Today was brought to the hospital by patient's son for vomiting and altered mental status. Per son patient has a caregiver who reported that patient woke up screaming and began to vomit also had profuse diaphoresis. In the ED patient was found to be hypothermic with temperature 94.4. Bair hugger blanket was applied and temperature increased 97.7. Patient's mental status is back to baseline. Lactate 3.3 with potassium 3.0. Patient denies any chest pain or shortness of breath. Denies dysuria.   Hospital Course:  Patient was admitted  for nausea and vomiting, and it was thought to be due to viral gastroenteritis.  This has resolved.  He was thought to have deliirium on top of his baseline dementia.  He was not lucid and was diaphoretic after each vomiting episodes.  He was found to be hyperthermic, and had required [facility].  His temp was 94.4 on admission, and family denied environmental exposure.  He was thought to have sepsis, and his lactic acid was elevated to 3.3.  It was felt subsequently that his lactic acid was from volume depletion.  He improved mentally, but still has intermittent confusions.  Family felt that he is now at his baseline.  He had WCT, and was seen by cardiology service, and Dr Harrington Challenger felt that it may have been PSVT with LBBB, and recommended to replete his low K of 3, and it was done.  It was thought that his N/V was from a viral gastroenteritis.   He did not eat and appears not thriving.  Discussed with his family today and updated them, and they would prefer to take him home today.  He has reach his baseline, and is stable for discharge.  He will follow up with his PCP next week.  Thank you and Good Day.    Procedures:  ECHO.   Consultations:  Cardiology.   Discharge Exam: Filed Vitals:   04/29/15 2051 04/30/15 0659  BP: 122/64 112/62  Pulse: 70 84  Temp: 98.7 F (37.1 C) 99.8 F (37.7 C)  Resp: 18 18    Discharge Instructions   Discharge Instructions    Diet - low sodium heart healthy    Complete  by:  As directed      Increase activity slowly    Complete by:  As directed           Current Discharge Medication List    START taking these medications   Details  potassium chloride SA (K-DUR,KLOR-CON) 20 MEQ tablet Take 1 tablet (20 mEq total) by mouth daily. Qty: 30 tablet, Refills: 1      CONTINUE these medications which have CHANGED   Details  levothyroxine (SYNTHROID, LEVOTHROID) 150 MCG tablet Take 1 tablet (150 mcg total) by mouth daily before breakfast. Qty: 30 tablet,  Refills: 1      CONTINUE these medications which have NOT CHANGED   Details  amLODipine (NORVASC) 5 MG tablet Take 5 mg by mouth daily.    aspirin EC 81 MG tablet Take 81 mg by mouth daily.      atenolol (TENORMIN) 50 MG tablet Take 50 mg by mouth daily.      donepezil (ARICEPT) 10 MG tablet Take 10 mg by mouth at bedtime.     gabapentin (NEURONTIN) 100 MG capsule Take 100 mg by mouth daily as needed (for neuropathy).     HUMALOG 100 UNIT/ML injection Inject 3-5 Units into the skin 2 (two) times daily with a meal. Sliding scale    hydrocortisone cream 1 % Apply 1 application topically daily as needed (for irritation).    LANTUS 100 UNIT/ML injection Inject 15 Units into the skin at bedtime.     memantine (NAMENDA) 10 MG tablet Take 10 mg by mouth 2 (two) times daily.     olmesartan (BENICAR) 20 MG tablet Take 20 mg by mouth 2 (two) times daily before a meal.     pravastatin (PRAVACHOL) 40 MG tablet Take 40 mg by mouth every morning.       STOP taking these medications     cephALEXin (KEFLEX) 500 MG capsule      nitroGLYCERIN (NITROSTAT) 0.4 MG SL tablet      Tolnaftate (ANTI-FUNGAL EX)        No Known Allergies    The results of significant diagnostics from this hospitalization (including imaging, microbiology, ancillary and laboratory) are listed below for reference.    Significant Diagnostic Studies: Dg Chest 1 View  04/27/2015  CLINICAL DATA:  74 year old male with altered mental status. History of COPD and CHF EXAM: CHEST 1 VIEW COMPARISON:  Radiograph dated 01/05/2015 FINDINGS: Single-view of the chest demonstrates emphysematous changes of the lungs. No focal consolidation, pleural effusion, or pneumothorax. Stable cardiac silhouette. The osseous structures are grossly unremarkable. IMPRESSION: No active disease. Electronically Signed   By: Anner Crete M.D.   On: 04/27/2015 23:22   Ct Head Wo Contrast  04/27/2015  CLINICAL DATA:  Altered mental status,  vomiting. EXAM: CT HEAD WITHOUT CONTRAST TECHNIQUE: Contiguous axial images were obtained from the base of the skull through the vertex without intravenous contrast. COMPARISON:  MRI of the head Sep 11, 2014 FINDINGS: The ventricles and sulci are proportionally mildly prominent for age. Mild disproportionate cerebellar volume loss. No intraparenchymal hemorrhage, mass effect nor midline shift. Patchy to confluent supratentorial white matter hypodensities. No acute large vascular territory infarcts. Biparietal encephalomalacia. No abnormal extra-axial fluid collections. Basal cisterns are patent. Moderate calcific atherosclerosis of the carotid siphons and included vertebral arteries. No skull fracture. The included ocular globes and orbital contents are non-suspicious. The mastoid aircells and included paranasal sinuses are well-aerated. Soft tissue within the RIGHT external auditory canal compatible with cerumen. IMPRESSION: No acute  intracranial process. Moderate to severe global brain atrophy with somewhat disproportionate cerebellar volume loss. Moderate to severe chronic small vessel ischemic disease. Biparietal encephalomalacia, present on prior CT examination. Electronically Signed   By: Elon Alas M.D.   On: 04/27/2015 23:48    Microbiology: Recent Results (from the past 240 hour(s))  Blood culture (routine x 2)     Status: None (Preliminary result)   Collection Time: 04/27/15 10:45 PM  Result Value Ref Range Status   Specimen Description BLOOD LEFT ANTECUBITAL  Final   Special Requests   Final    BOTTLES DRAWN AEROBIC AND ANAEROBIC AEB=4CC ANA=2CC   Culture NO GROWTH 3 DAYS  Final   Report Status PENDING  Incomplete  Blood culture (routine x 2)     Status: None (Preliminary result)   Collection Time: 04/27/15 11:00 PM  Result Value Ref Range Status   Specimen Description BLOOD LEFT FOREARM  Final   Special Requests BOTTLES DRAWN AEROBIC AND ANAEROBIC 4CC EACH  Final   Culture NO  GROWTH 3 DAYS  Final   Report Status PENDING  Incomplete     Labs: Basic Metabolic Panel:  Recent Labs Lab 04/27/15 2245 04/28/15 0107 04/28/15 0537 04/29/15 0646 04/30/15 0623  NA 140  --  144 141 138  K 3.0*  --  4.2 4.1 4.7  CL 104  --  110 105 102  CO2 24  --  27 27 27   GLUCOSE 288*  --  179* 99 198*  BUN 19  --  19 12 23*  CREATININE 1.08  --  0.95 0.95 1.24  CALCIUM 9.1  --  8.6* 9.1 8.8*  MG  --  1.9  --   --   --    Liver Function Tests:  Recent Labs Lab 04/27/15 2245 04/28/15 0537  AST 26 20  ALT 16* 14*  ALKPHOS 106 85  BILITOT 0.5 0.8  PROT 7.9 6.7  ALBUMIN 3.7 3.1*    Recent Labs Lab 04/27/15 2245  LIPASE 27   No results for input(s): AMMONIA in the last 168 hours. CBC:  Recent Labs Lab 04/27/15 2245 04/28/15 0537  WBC 12.7* 8.9  HGB 16.5 15.1  HCT 50.2 47.0  MCV 90.5 90.0  PLT 128* 124*   Cardiac Enzymes:  Recent Labs Lab 04/27/15 2245  TROPONINI 0.03   BNP: BNP (last 3 results) No results for input(s): BNP in the last 8760 hours.  ProBNP (last 3 results) No results for input(s): PROBNP in the last 8760 hours.  CBG:  Recent Labs Lab 04/29/15 0719 04/29/15 1104 04/29/15 1642 04/29/15 2134 04/30/15 0815  GLUCAP 85 104* 145* 170* 200*    Signed:  Lynora Dymond MD  FACP  [facility] 04/30/2015, 10:06 AM

## 2015-04-30 NOTE — Care Management Note (Signed)
Case Management Note  Patient Details  Name: Peter Harvey MRN: [id number] Date of Birth: 1940/08/19  Expected Discharge Date:                  Expected Discharge Plan:  Home/Self Care  In-House Referral:  NA  Discharge planning Services  CM Consult  Post Acute Care Choice:  Durable Medical Equipment Choice offered to:  Adult Children  DME Arranged:  Hospital bed DME Agency:  [place]:    [place] Agency:     Status of Service:  Completed, signed off  Medicare Important Message Given:    Date Medicare IM Given:    Medicare IM give by:    Date Additional Medicare IM Given:    Additional Medicare Important Message give by:     If discussed at [place] of Stay Meetings, dates discussed:    Additional Comments: Pt discharging home with 24/7 PD supervision. Pt has no CM needs.  Sherald Barge, RN 04/30/2015, 2:07 PM

## 2015-04-30 NOTE — Progress Notes (Signed)
Inpatient Diabetes Program Recommendations  AACE/ADA: New Consensus Statement on Inpatient Glycemic Control (2015)  Target Ranges:  Prepandial:   less than 140 mg/dL      Peak postprandial:   less than 180 mg/dL (1-2 hours)      Critically ill patients:  140 - 180 mg/dL  Results for MIKIO, VERHAEGHE (MRN [id number]) as of 04/30/2015 07:50  Ref. Range 04/29/2015 07:19 04/29/2015 11:04 04/29/2015 16:42 04/29/2015 21:34  Glucose-Capillary Latest Ref Range: 65-99 mg/dL 85 104 (H) 145 (H) 170 (H)   Review of Glycemic Control  Diabetes history: DM2 Outpatient Diabetes medications: Lantus 15 units QHS, Humalog 3-5 units BID Current orders for Inpatient glycemic control: Lantus 15 units QHS  Inpatient Diabetes Program Recommendations: Correction (SSI): Please consider ordering CBGs with Novolog sensitive correction scale ACHS.  Thanks, Barnie Alderman, RN, MSN, CDE Diabetes Coordinator Inpatient Diabetes Program [phone] (Team Pager from [place] to [place]) [phone] ([place] office) [phone] [facility] office) [phone] [facility] office)

## 2015-04-30 NOTE — Evaluation (Signed)
Physical Therapy Evaluation Patient Details Name: Peter Harvey MRN: [id number] DOB: [birth year] Today's Date: 04/30/2015   History of Present Illness  74 year old male who  has a past medical history of HTN (hypertension); DM (diabetes mellitus) ([place]); Hypothyroidism; Hernia; Dizziness; AAA (abdominal aortic aneurysm) ([place]); Hyperlipidemia; Leg pain; Colon cancer ([place]); CAD (coronary artery disease); PAD (peripheral artery disease) (HCC); CHF (congestive heart failure) ([place]); COPD (chronic obstructive pulmonary disease) ([place]); Dementia; and Wheelchair bound.  Clinical Impression  Pt has a 24 hour caregiver who states Peter Harvey is wheelchair bound and that she is with him 24/7   To assist with transfers.  Caregiver watched therapist transfer pt and stated that this is his prior level of function. Pt does not need skilled care at this time.     Follow Up Recommendations No PT follow up    Equipment Recommendations  None recommended by PT       Precautions / Restrictions Precautions Precautions: Fall Restrictions Weight Bearing Restrictions: No      Mobility  Bed Mobility Overal bed mobility: Needs Assistance Bed Mobility: Rolling;Sidelying to Sit Rolling: Mod assist Sidelying to sit: Mod assist          Transfers Overall transfer level: Needs assistance   Transfers: Stand Pivot Transfers              Ambulation/Gait Ambulation/Gait assistance:  (Pt has been non-ambulatory for years )                       Pertinent Vitals/Pain Pain Assessment: No/denies pain    Home Living Family/patient expects to be discharged to:: Private residence Living Arrangements: Alone Available Help at Discharge: Personal care attendant (24/7)   Home Access: Level entry     Home Layout: One level Home Equipment: Toilet riser;Grab bars - toilet;Wheelchair - manual Additional Comments: hospital bed was delivered yesterday    Prior Function Level of  Independence: Needs assistance   Gait / Transfers Assistance Needed: transfers with assistance is wheelchair bound  ADL's / Homemaking Assistance Needed: caregiver states she does all homemaking           Extremity/Trunk Assessment               Lower Extremity Assessment: RLE deficits/detail;LLE deficits/detail RLE Deficits / Details: generally 2-/5 for hip 3-/5 for knee LLE Deficits / Details: generally 2-/5 for hip; 3-/5 for knee     Communication   Communication: No difficulties  Cognition Arousal/Alertness: Awake/alert   Overall Cognitive Status: Within Functional Limits for tasks assessed                               Assessment/Plan    PT Assessment Patent does not need any further PT services  PT Diagnosis     PT Problem List    PT Treatment Interventions     PT Goals (Current goals can be found in the Care Plan section)                 End of Session Equipment Utilized During Treatment: Gait belt Activity Tolerance: Patient tolerated treatment well Patient left: in chair;with chair alarm set;with nursing/sitter in room      Functional Limitation: Changing and maintaining body position Changing and Maintaining Body Position Current Status [id number]): At least 80 percent but less than 100 percent impaired, limited or restricted Changing and Maintaining Body Position Goal Status [id number]): At least 80  percent but less than 100 percent impaired, limited or restricted Changing and Maintaining Body Position Discharge Status [phone]): At least 80 percent but less than 100 percent impaired, limited or restricted    Time: 0820-0852 PT Time Calculation (min) (ACUTE ONLY): 32 min   Charges:   PT Evaluation $Initial PT Evaluation Tier I: 1 Procedure     PT G Codes:   PT G-Codes **NOT FOR INPATIENT CLASS** Functional Limitation: Changing and maintaining body position Changing and Maintaining Body Position Current Status [id number]): At least 80  percent but less than 100 percent impaired, limited or restricted Changing and Maintaining Body Position Goal Status [id number]): At least 80 percent but less than 100 percent impaired, limited or restricted Changing and Maintaining Body Position Discharge Status [phone]): At least 80 percent but less than 100 percent impaired, limited or restricted  Peter Harvey, PT CLT [phone] 04/30/2015, 8:57 AM

## 2015-05-02 LAB — CULTURE, BLOOD (ROUTINE X 2)
Culture: NO GROWTH
Culture: NO GROWTH

## 2015-05-18 ENCOUNTER — Encounter (HOSPITAL_COMMUNITY): Payer: Self-pay | Admitting: Emergency Medicine

## 2015-05-18 ENCOUNTER — Inpatient Hospital Stay (HOSPITAL_COMMUNITY)
Admission: EM | Admit: 2015-05-18 | Discharge: 2015-06-09 | DRG: 871 | Disposition: E | Payer: Medicare Other | Attending: Internal Medicine | Admitting: Internal Medicine

## 2015-05-18 ENCOUNTER — Emergency Department (HOSPITAL_COMMUNITY): Payer: Medicare Other

## 2015-05-18 DIAGNOSIS — J449 Chronic obstructive pulmonary disease, unspecified: Secondary | ICD-10-CM | POA: Diagnosis present

## 2015-05-18 DIAGNOSIS — I959 Hypotension, unspecified: Secondary | ICD-10-CM | POA: Diagnosis present

## 2015-05-18 DIAGNOSIS — B372 Candidiasis of skin and nail: Secondary | ICD-10-CM | POA: Diagnosis present

## 2015-05-18 DIAGNOSIS — R532 Functional quadriplegia: Secondary | ICD-10-CM | POA: Diagnosis present

## 2015-05-18 DIAGNOSIS — Z833 Family history of diabetes mellitus: Secondary | ICD-10-CM

## 2015-05-18 DIAGNOSIS — Z8249 Family history of ischemic heart disease and other diseases of the circulatory system: Secondary | ICD-10-CM

## 2015-05-18 DIAGNOSIS — R109 Unspecified abdominal pain: Secondary | ICD-10-CM | POA: Diagnosis present

## 2015-05-18 DIAGNOSIS — Z7982 Long term (current) use of aspirin: Secondary | ICD-10-CM | POA: Diagnosis not present

## 2015-05-18 DIAGNOSIS — D696 Thrombocytopenia, unspecified: Secondary | ICD-10-CM | POA: Diagnosis present

## 2015-05-18 DIAGNOSIS — Z7189 Other specified counseling: Secondary | ICD-10-CM | POA: Insufficient documentation

## 2015-05-18 DIAGNOSIS — B3749 Other urogenital candidiasis: Secondary | ICD-10-CM | POA: Diagnosis present

## 2015-05-18 DIAGNOSIS — N179 Acute kidney failure, unspecified: Secondary | ICD-10-CM | POA: Diagnosis present

## 2015-05-18 DIAGNOSIS — L8990 Pressure ulcer of unspecified site, unspecified stage: Secondary | ICD-10-CM | POA: Insufficient documentation

## 2015-05-18 DIAGNOSIS — Z6821 Body mass index (BMI) 21.0-21.9, adult: Secondary | ICD-10-CM

## 2015-05-18 DIAGNOSIS — Z791 Long term (current) use of non-steroidal anti-inflammatories (NSAID): Secondary | ICD-10-CM

## 2015-05-18 DIAGNOSIS — Z825 Family history of asthma and other chronic lower respiratory diseases: Secondary | ICD-10-CM

## 2015-05-18 DIAGNOSIS — L89152 Pressure ulcer of sacral region, stage 2: Secondary | ICD-10-CM | POA: Diagnosis present

## 2015-05-18 DIAGNOSIS — I10 Essential (primary) hypertension: Secondary | ICD-10-CM | POA: Diagnosis present

## 2015-05-18 DIAGNOSIS — E1165 Type 2 diabetes mellitus with hyperglycemia: Secondary | ICD-10-CM | POA: Diagnosis present

## 2015-05-18 DIAGNOSIS — F0390 Unspecified dementia without behavioral disturbance: Secondary | ICD-10-CM | POA: Diagnosis present

## 2015-05-18 DIAGNOSIS — Z79899 Other long term (current) drug therapy: Secondary | ICD-10-CM | POA: Diagnosis not present

## 2015-05-18 DIAGNOSIS — R32 Unspecified urinary incontinence: Secondary | ICD-10-CM | POA: Diagnosis present

## 2015-05-18 DIAGNOSIS — I248 Other forms of acute ischemic heart disease: Secondary | ICD-10-CM | POA: Diagnosis present

## 2015-05-18 DIAGNOSIS — R778 Other specified abnormalities of plasma proteins: Secondary | ICD-10-CM | POA: Diagnosis present

## 2015-05-18 DIAGNOSIS — E43 Unspecified severe protein-calorie malnutrition: Secondary | ICD-10-CM | POA: Diagnosis present

## 2015-05-18 DIAGNOSIS — E875 Hyperkalemia: Secondary | ICD-10-CM | POA: Diagnosis present

## 2015-05-18 DIAGNOSIS — I739 Peripheral vascular disease, unspecified: Secondary | ICD-10-CM | POA: Diagnosis present

## 2015-05-18 DIAGNOSIS — R627 Adult failure to thrive: Secondary | ICD-10-CM | POA: Diagnosis present

## 2015-05-18 DIAGNOSIS — Z87891 Personal history of nicotine dependence: Secondary | ICD-10-CM | POA: Diagnosis not present

## 2015-05-18 DIAGNOSIS — Z7401 Bed confinement status: Secondary | ICD-10-CM

## 2015-05-18 DIAGNOSIS — K921 Melena: Secondary | ICD-10-CM | POA: Diagnosis present

## 2015-05-18 DIAGNOSIS — Z466 Encounter for fitting and adjustment of urinary device: Secondary | ICD-10-CM | POA: Diagnosis not present

## 2015-05-18 DIAGNOSIS — Z515 Encounter for palliative care: Secondary | ICD-10-CM | POA: Insufficient documentation

## 2015-05-18 DIAGNOSIS — G9340 Encephalopathy, unspecified: Secondary | ICD-10-CM | POA: Diagnosis present

## 2015-05-18 DIAGNOSIS — Z794 Long term (current) use of insulin: Secondary | ICD-10-CM

## 2015-05-18 DIAGNOSIS — I2510 Atherosclerotic heart disease of native coronary artery without angina pectoris: Secondary | ICD-10-CM | POA: Diagnosis present

## 2015-05-18 DIAGNOSIS — Z66 Do not resuscitate: Secondary | ICD-10-CM | POA: Diagnosis present

## 2015-05-18 DIAGNOSIS — E039 Hypothyroidism, unspecified: Secondary | ICD-10-CM | POA: Diagnosis present

## 2015-05-18 DIAGNOSIS — Z85038 Personal history of other malignant neoplasm of large intestine: Secondary | ICD-10-CM | POA: Diagnosis not present

## 2015-05-18 DIAGNOSIS — E785 Hyperlipidemia, unspecified: Secondary | ICD-10-CM | POA: Diagnosis present

## 2015-05-18 DIAGNOSIS — R7989 Other specified abnormal findings of blood chemistry: Secondary | ICD-10-CM | POA: Diagnosis not present

## 2015-05-18 DIAGNOSIS — R338 Other retention of urine: Secondary | ICD-10-CM | POA: Diagnosis not present

## 2015-05-18 DIAGNOSIS — IMO0002 Reserved for concepts with insufficient information to code with codable children: Secondary | ICD-10-CM | POA: Diagnosis present

## 2015-05-18 DIAGNOSIS — R944 Abnormal results of kidney function studies: Secondary | ICD-10-CM | POA: Diagnosis not present

## 2015-05-18 DIAGNOSIS — B377 Candidal sepsis: Secondary | ICD-10-CM | POA: Diagnosis not present

## 2015-05-18 DIAGNOSIS — A419 Sepsis, unspecified organism: Secondary | ICD-10-CM | POA: Diagnosis present

## 2015-05-18 DIAGNOSIS — N390 Urinary tract infection, site not specified: Secondary | ICD-10-CM | POA: Diagnosis present

## 2015-05-18 DIAGNOSIS — Z993 Dependence on wheelchair: Secondary | ICD-10-CM | POA: Diagnosis not present

## 2015-05-18 DIAGNOSIS — R339 Retention of urine, unspecified: Secondary | ICD-10-CM | POA: Diagnosis present

## 2015-05-18 DIAGNOSIS — N471 Phimosis: Secondary | ICD-10-CM | POA: Diagnosis present

## 2015-05-18 DIAGNOSIS — Z9049 Acquired absence of other specified parts of digestive tract: Secondary | ICD-10-CM | POA: Diagnosis not present

## 2015-05-18 LAB — URINE MICROSCOPIC-ADD ON

## 2015-05-18 LAB — COMPREHENSIVE METABOLIC PANEL
ALBUMIN: 2.3 g/dL — AB (ref 3.5–5.0)
ALT: 30 U/L (ref 17–63)
ANION GAP: 12 (ref 5–15)
AST: 62 U/L — AB (ref 15–41)
Alkaline Phosphatase: 171 U/L — ABNORMAL HIGH (ref 38–126)
BILIRUBIN TOTAL: 0.4 mg/dL (ref 0.3–1.2)
BUN: 91 mg/dL — AB (ref 6–20)
CHLORIDE: 100 mmol/L — AB (ref 101–111)
CO2: 24 mmol/L (ref 22–32)
Calcium: 8.4 mg/dL — ABNORMAL LOW (ref 8.9–10.3)
Creatinine, Ser: 4.69 mg/dL — ABNORMAL HIGH (ref 0.61–1.24)
GFR calc Af Amer: 13 mL/min — ABNORMAL LOW (ref >60)
GFR calc non Af Amer: 11 mL/min — ABNORMAL LOW (ref >60)
GLUCOSE: 292 mg/dL — AB (ref 65–99)
POTASSIUM: 5.6 mmol/L — AB (ref 3.5–5.1)
SODIUM: 136 mmol/L (ref 135–145)
Total Protein: 6.8 g/dL (ref 6.5–8.1)

## 2015-05-18 LAB — CBC WITH DIFFERENTIAL/PLATELET
Basophils Absolute: 0 10*3/uL (ref 0.0–0.1)
Basophils Relative: 0 %
EOS ABS: 0 10*3/uL (ref 0.0–0.7)
EOS PCT: 0 %
HCT: 47.6 % (ref 39.0–52.0)
Hemoglobin: 15.5 g/dL (ref 13.0–17.0)
LYMPHS ABS: 0.9 10*3/uL (ref 0.7–4.0)
LYMPHS PCT: 5 %
MCH: 29.5 pg (ref 26.0–34.0)
MCHC: 32.6 g/dL (ref 30.0–36.0)
MCV: 90.7 fL (ref 78.0–100.0)
MONO ABS: 0.9 10*3/uL (ref 0.1–1.0)
MONOS PCT: 5 %
Neutro Abs: 16.6 10*3/uL — ABNORMAL HIGH (ref 1.7–7.7)
Neutrophils Relative %: 90 %
PLATELETS: 135 10*3/uL — AB (ref 150–400)
RBC: 5.25 MIL/uL (ref 4.22–5.81)
RDW: 14.2 % (ref 11.5–15.5)
WBC: 18.4 10*3/uL — AB (ref 4.0–10.5)

## 2015-05-18 LAB — URINALYSIS, ROUTINE W REFLEX MICROSCOPIC
Bilirubin Urine: NEGATIVE
GLUCOSE, UA: NEGATIVE mg/dL
Ketones, ur: NEGATIVE mg/dL
Nitrite: NEGATIVE
PH: 5.5 (ref 5.0–8.0)
SPECIFIC GRAVITY, URINE: 1.025 (ref 1.005–1.030)

## 2015-05-18 LAB — BASIC METABOLIC PANEL
Anion gap: 8 (ref 5–15)
BUN: 88 mg/dL — ABNORMAL HIGH (ref 6–20)
CO2: 19 mmol/L — ABNORMAL LOW (ref 22–32)
Calcium: 7.2 mg/dL — ABNORMAL LOW (ref 8.9–10.3)
Chloride: 107 mmol/L (ref 101–111)
Creatinine, Ser: 4.29 mg/dL — ABNORMAL HIGH (ref 0.61–1.24)
GFR calc Af Amer: 14 mL/min — ABNORMAL LOW (ref >60)
GFR calc non Af Amer: 12 mL/min — ABNORMAL LOW (ref >60)
Glucose, Bld: 235 mg/dL — ABNORMAL HIGH (ref 65–99)
Potassium: 5.4 mmol/L — ABNORMAL HIGH (ref 3.5–5.1)
Sodium: 134 mmol/L — ABNORMAL LOW (ref 135–145)

## 2015-05-18 LAB — HEMOGLOBIN AND HEMATOCRIT, BLOOD
HCT: 41.6 % (ref 39.0–52.0)
Hemoglobin: 13.1 g/dL (ref 13.0–17.0)

## 2015-05-18 LAB — GLUCOSE, CAPILLARY: GLUCOSE-CAPILLARY: 189 mg/dL — AB (ref 65–99)

## 2015-05-18 LAB — TYPE AND SCREEN
ABO/RH(D): B POS
Antibody Screen: NEGATIVE

## 2015-05-18 LAB — APTT: aPTT: 33 seconds (ref 24–37)

## 2015-05-18 LAB — LACTIC ACID, PLASMA
LACTIC ACID, VENOUS: 1.5 mmol/L (ref 0.5–2.0)
Lactic Acid, Venous: 2.7 mmol/L (ref 0.5–2.0)

## 2015-05-18 LAB — TROPONIN I
Troponin I: 0.54 ng/mL (ref <0.031)
Troponin I: 0.3 ng/mL — ABNORMAL HIGH (ref <0.031)

## 2015-05-18 LAB — MAGNESIUM: Magnesium: 2.5 mg/dL — ABNORMAL HIGH (ref 1.7–2.4)

## 2015-05-18 LAB — PROTIME-INR
INR: 1.17 (ref 0.00–1.49)
Prothrombin Time: 15.1 seconds (ref 11.6–15.2)

## 2015-05-18 LAB — LIPASE, BLOOD: Lipase: 40 U/L (ref 11–51)

## 2015-05-18 LAB — POC OCCULT BLOOD, ED: Fecal Occult Bld: POSITIVE — AB

## 2015-05-18 MED ORDER — ONDANSETRON HCL 4 MG/2ML IJ SOLN: 4.0000 mg | Freq: Three times a day (TID) | INTRAMUSCULAR | Status: DC | PRN

## 2015-05-18 MED ORDER — SODIUM POLYSTYRENE SULFONATE 15 GM/60ML PO SUSP
30.0000 g | Freq: Once | ORAL | Status: AC
  Administered 2015-05-19: 30 g via ORAL
  Filled 2015-05-18: qty 120

## 2015-05-18 MED ORDER — ACETAMINOPHEN 325 MG PO TABS
650.0000 mg | ORAL_TABLET | Freq: Four times a day (QID) | ORAL | Status: DC | PRN
  Administered 2015-05-18: 650 mg via ORAL
  Filled 2015-05-18: qty 2

## 2015-05-18 MED ORDER — GABAPENTIN 100 MG PO CAPS: 100.0000 mg | ORAL_CAPSULE | Freq: Every day | ORAL | Status: DC | PRN

## 2015-05-18 MED ORDER — SODIUM CHLORIDE 0.9 % IV BOLUS (SEPSIS)
1000.0000 mL | Freq: Once | INTRAVENOUS | Status: AC
Start: 2015-05-18 — End: 2015-05-18
  Administered 2015-05-18: 1000 mL via INTRAVENOUS

## 2015-05-18 MED ORDER — MEMANTINE HCL 10 MG PO TABS
10.0000 mg | ORAL_TABLET | Freq: Two times a day (BID) | ORAL | Status: DC
  Administered 2015-05-18 – 2015-05-20 (×2): 10 mg via ORAL
  Filled 2015-05-18 (×3): qty 1

## 2015-05-18 MED ORDER — SODIUM CHLORIDE 0.9 % IV SOLN
INTRAVENOUS | Status: DC
  Administered 2015-05-18 – 2015-05-19 (×2): via INTRAVENOUS
  Administered 2015-05-19: 125 mL/h via INTRAVENOUS

## 2015-05-18 MED ORDER — PRAVASTATIN SODIUM 40 MG PO TABS: 40.0000 mg | ORAL_TABLET | Freq: Every day | ORAL | Status: DC

## 2015-05-18 MED ORDER — NYSTATIN 100000 UNIT/GM EX POWD
Freq: Three times a day (TID) | CUTANEOUS | Status: DC
  Administered 2015-05-18 – 2015-05-20 (×6): via TOPICAL
  Filled 2015-05-18: qty 15

## 2015-05-18 MED ORDER — HEPARIN SODIUM (PORCINE) 5000 UNIT/ML IJ SOLN
5000.0000 [IU] | Freq: Three times a day (TID) | INTRAMUSCULAR | Status: DC
  Administered 2015-05-19 – 2015-05-20 (×4): 5000 [IU] via SUBCUTANEOUS
  Filled 2015-05-18 (×3): qty 1

## 2015-05-18 MED ORDER — ONDANSETRON HCL 4 MG/2ML IJ SOLN: 4.0000 mg | Freq: Four times a day (QID) | INTRAMUSCULAR | Status: DC | PRN

## 2015-05-18 MED ORDER — CETYLPYRIDINIUM CHLORIDE 0.05 % MT LIQD
7.0000 mL | Freq: Two times a day (BID) | OROMUCOSAL | Status: DC
  Administered 2015-05-19 – 2015-05-20 (×4): 7 mL via OROMUCOSAL

## 2015-05-18 MED ORDER — CHLORHEXIDINE GLUCONATE 0.12 % MT SOLN
15.0000 mL | Freq: Two times a day (BID) | OROMUCOSAL | Status: DC
  Administered 2015-05-18 – 2015-05-20 (×4): 15 mL via OROMUCOSAL
  Filled 2015-05-18 (×3): qty 15

## 2015-05-18 MED ORDER — DEXTROSE 5 % IV SOLN
1.0000 g | Freq: Once | INTRAVENOUS | Status: AC
  Administered 2015-05-18: 1 g via INTRAVENOUS
  Filled 2015-05-18: qty 1

## 2015-05-18 MED ORDER — INSULIN GLARGINE 100 UNIT/ML ~~LOC~~ SOLN
15.0000 [IU] | Freq: Every day | SUBCUTANEOUS | Status: DC
  Administered 2015-05-18: 15 [IU] via SUBCUTANEOUS
  Filled 2015-05-18 (×2): qty 0.15

## 2015-05-18 MED ORDER — ONDANSETRON HCL 4 MG PO TABS: 4.0000 mg | ORAL_TABLET | Freq: Four times a day (QID) | ORAL | Status: DC | PRN

## 2015-05-18 MED ORDER — ASPIRIN EC 81 MG PO TBEC
81.0000 mg | DELAYED_RELEASE_TABLET | Freq: Every day | ORAL | Status: DC
  Administered 2015-05-20: 81 mg via ORAL
  Filled 2015-05-18: qty 1

## 2015-05-18 MED ORDER — ACETAMINOPHEN 650 MG RE SUPP
650.0000 mg | Freq: Four times a day (QID) | RECTAL | Status: DC | PRN
  Administered 2015-05-19 – 2015-05-20 (×3): 650 mg via RECTAL
  Filled 2015-05-18 (×3): qty 1

## 2015-05-18 MED ORDER — SODIUM CHLORIDE 0.9 % IV BOLUS (SEPSIS)
500.0000 mL | INTRAVENOUS | Status: AC
  Administered 2015-05-18: 500 mL via INTRAVENOUS

## 2015-05-18 MED ORDER — AZTREONAM 1 G IJ SOLR
INTRAMUSCULAR | Status: AC
  Filled 2015-05-18: qty 1

## 2015-05-18 MED ORDER — INSULIN ASPART 100 UNIT/ML ~~LOC~~ SOLN: 0.0000 [IU] | Freq: Three times a day (TID) | SUBCUTANEOUS | Status: DC

## 2015-05-18 MED ORDER — SODIUM CHLORIDE 0.9 % IV BOLUS (SEPSIS)
1000.0000 mL | INTRAVENOUS | Status: AC
  Administered 2015-05-18: 1000 mL via INTRAVENOUS

## 2015-05-18 MED ORDER — SODIUM CHLORIDE 0.9 % IV SOLN: INTRAVENOUS | Status: DC

## 2015-05-18 MED ORDER — SODIUM CHLORIDE 0.9 % IV BOLUS (SEPSIS)
500.0000 mL | Freq: Once | INTRAVENOUS | Status: AC
  Administered 2015-05-18: 500 mL via INTRAVENOUS

## 2015-05-18 MED ORDER — LEVOTHYROXINE SODIUM 75 MCG PO TABS
150.0000 ug | ORAL_TABLET | Freq: Every day | ORAL | Status: DC
  Administered 2015-05-20: 150 ug via ORAL
  Filled 2015-05-18: qty 2

## 2015-05-18 MED ORDER — DEXTROSE 5 % IV SOLN
1.0000 g | Freq: Three times a day (TID) | INTRAVENOUS | Status: DC
  Administered 2015-05-19 – 2015-05-20 (×6): 1 g via INTRAVENOUS
  Filled 2015-05-18 (×8): qty 1

## 2015-05-18 MED ORDER — DONEPEZIL HCL 5 MG PO TABS
10.0000 mg | ORAL_TABLET | Freq: Every day | ORAL | Status: DC
  Administered 2015-05-18: 10 mg via ORAL
  Filled 2015-05-18 (×2): qty 2

## 2015-05-18 NOTE — ED Notes (Signed)
Son requesting for skilled nursing placement for pt

## 2015-05-18 NOTE — H&P (Signed)
PCP:   Leonides Grills, MD   Chief Complaint:  Abdominal pain  HPI: 75 year old male who   has a past medical history of HTN (hypertension); DM (diabetes mellitus) ([place]); Hypothyroidism; Hernia; Dizziness; AAA (abdominal aortic aneurysm) ([place]); Hyperlipidemia; Leg pain; Colon cancer ([place]); CAD (coronary artery disease); PAD (peripheral artery disease) (HCC); CHF (congestive heart failure) ([place]); COPD (chronic obstructive pulmonary disease) ([place]); Dementia; and Wheelchair bound. Today was brought to the hospital for abdominal pain. Patient was recently admitted to the hospital for viral gastroenteritis and was discharged home. As per patient's son, he has not been doing well. Has poor by mouth intake. Also complained of lower abdominal pain. He denies any fever. No chest pain or shortness of breath. No nausea vomiting. Had one loose stool yesterday.  In the ED patient found to be in acute kidney injury, with BUN/creatinine of 91/4.69 Also found to have elevated troponin 0.54. Patient hypotensive with blood pressure 74/46, responding to IV fluid boluses. Also showed abnormal UA. With lactic acid 2.7  Allergies:   Allergies  Allergen Reactions  . Penicillins Rash    Has patient had a PCN reaction causing immediate rash, facial/tongue/throat swelling, SOB or lightheadedness with hypotension:unknown Has patient had a PCN reaction causing severe rash involving mucus membranes or skin necrosis:unknown Has patient had a PCN reaction that required hospitalization unknown Has patient had a PCN reaction occurring within the last 10 years:No If all of the above answers are "NO", then may proceed with Cephalosporin use. Pt is not aware of this allergy      Past Medical History  Diagnosis Date  . HTN (hypertension)   . DM (diabetes mellitus) ([place])   . Hypothyroidism   . Hernia   . Dizziness   . AAA (abdominal aortic aneurysm) ([place])     Dr. Scot Dock  . Hyperlipidemia   . Leg pain    with walking  . Colon cancer ([place])     adenocarcinoma; s/p R hemicolectomy 8/12  . CAD (coronary artery disease)     EF 57% by Myoview 4/12;  cath 5/12: dLM 50%, pLAD 50%, pCFX 50%, OM1 occluded with distal filling with L-L collats, mRCA 70-75%, dRCA 80%;  severe RCA needs PCI but treated medically due to need for colon surgery and plans to possilby perform PCI in future  . PAD (peripheral artery disease) (HCC)     c/b dry gangrene of toes  . CHF (congestive heart failure) ([place])   . COPD (chronic obstructive pulmonary disease) ([place])   . Dementia   . Wheelchair bound     Past Surgical History  Procedure Laterality Date  . Hernia repair    . Appendectomy    . Cardiac catheterization    . Colon surgery  12/12/10    r/t colon CA  . Abdominal surgery    . Abdominal aortic aneurysm repair  02/17/11    Open AAA repair and Right Fem-Pop  . Colonoscopy  03/14/2012    Procedure: COLONOSCOPY;  Surgeon: Rogene Houston, MD;  [facility];  Service: Endoscopy;  Laterality: N/A;  345-Ann to notify pt of new time    Prior to Admission medications   Medication Sig Start Date End Date Taking? Authorizing Provider  amLODipine (NORVASC) 5 MG tablet Take 5 mg by mouth daily.   Yes Historical Provider, MD  aspirin EC 81 MG tablet Take 81 mg by mouth daily.     Yes Historical Provider, MD  atenolol (TENORMIN) 50 MG tablet Take  50 mg by mouth daily.     Yes Historical Provider, MD  donepezil (ARICEPT) 10 MG tablet Take 10 mg by mouth at bedtime.    Yes Historical Provider, MD  gabapentin (NEURONTIN) 100 MG capsule Take 100 mg by mouth daily as needed (for neuropathy).  03/27/14  Yes Historical Provider, MD  HUMALOG 100 UNIT/ML injection Inject 3-5 Units into the skin 2 (two) times daily with a meal. Sliding scale 10/27/14  Yes Historical Provider, MD  ibuprofen (ADVIL,MOTRIN) 200 MG tablet Take 200 mg by mouth every 6 (six) hours as needed for mild pain or moderate pain.   Yes Historical Provider,  MD  LANTUS 100 UNIT/ML injection Inject 15 Units into the skin at bedtime. Personal sliding scale 03/14/11  Yes Historical Provider, MD  levothyroxine (SYNTHROID, LEVOTHROID) 150 MCG tablet Take 1 tablet (150 mcg total) by mouth daily before breakfast. 04/30/15  Yes Orvan Falconer, MD  memantine (NAMENDA) 10 MG tablet Take 10 mg by mouth 2 (two) times daily.  12/06/14  Yes Historical Provider, MD  olmesartan (BENICAR) 20 MG tablet Take 20 mg by mouth 2 (two) times daily before a meal.    Yes Historical Provider, MD  potassium chloride SA (K-DUR,KLOR-CON) 20 MEQ tablet Take 1 tablet (20 mEq total) by mouth daily. Patient taking differently: Take 10 mEq by mouth 2 (two) times daily.  04/30/15  Yes Orvan Falconer, MD  pravastatin (PRAVACHOL) 40 MG tablet Take 40 mg by mouth every morning.  07/08/12  Yes Historical Provider, MD  levofloxacin (LEVAQUIN) 500 MG tablet Take 500 mg by mouth daily. Reported on 06/07/2015 05/06/15   Historical Provider, MD    Social History:  reports that he quit smoking about 4 years ago. His smoking use included Cigarettes. He has a 100 pack-year smoking history. He has never used smokeless tobacco. He reports that he does not drink alcohol or use illicit drugs.  Family History  Problem Relation Age of Onset  . Diabetes Mother   . Heart disease Mother     heart failure  . Heart attack Mother   . Heart disease Brother   . Other Brother 22    MRSA  . COPD Father   . Heart disease Father     heart failure  . COPD Sister     Danley Danker Weights   06/01/2015 1617  Weight: 68.04 kg (150 lb)    All the positives are listed in BOLD  Review of Systems:  HEENT: Headache, blurred vision, runny nose, sore throat Neck: Hypothyroidism, hyperthyroidism,,lymphadenopathy Chest : Shortness of breath, history of COPD, Asthma Heart : Chest pain, history of coronary arterey disease GI:  Nausea, vomiting, diarrhea, constipation, GERD GU: Dysuria, urgency, frequency of urination, hematuria,  incontinence of urine Neuro: Stroke, seizures, syncope Psych: Depression, anxiety, hallucinations   Physical Exam: Blood pressure 74/46, pulse 64, temperature 98.1 F (36.7 C), temperature source Rectal, resp. rate 18, height 5\' 10"  (1.778 m), weight 68.04 kg (150 lb), SpO2 94 %. Constitutional:   Patient is a sick appearing male * in no acute distress and cooperative with exam. Head: Normocephalic and atraumatic Mouth: Mucus membranes moist Eyes: PERRL, EOMI, conjunctivae normal Neck: Supple, No Thyromegaly Cardiovascular: RRR, S1 normal, S2 normal Pulmonary/Chest: CTAB, no wheezes, rales, or rhonchi Abdominal: Soft. Non-tender, non-distended, bowel sounds are normal, no masses, organomegaly, or guarding present.  Neurological: A&O x3, Strength is normal and symmetric bilaterally, cranial nerve II-XII are grossly intact, no focal motor deficit, sensory intact to light touch bilaterally.  Extremities : No Cyanosis, Clubbing or Edema Skin- mild erythema noted in the lower abdominal wall, including the pelvic area, skin of the penis and scrotum  Labs on Admission:  Basic Metabolic Panel:  Recent Labs Lab  1524  NA 136  K 5.6*  CL 100*  CO2 24  GLUCOSE 292*  BUN 91*  CREATININE 4.69*  CALCIUM 8.4*   Liver Function Tests:  Recent Labs Lab 05/28/2015 1524  AST 62*  ALT 30  ALKPHOS 171*  BILITOT 0.4  PROT 6.8  ALBUMIN 2.3*    Recent Labs Lab  1524  LIPASE 40   No results for input(s): AMMONIA in the last 168 hours. CBC:  Recent Labs Lab 05/27/2015 1524  WBC 18.4*  NEUTROABS 16.6*  HGB 15.5  HCT 47.6  MCV 90.7  PLT 135*   Cardiac Enzymes:  Recent Labs Lab 05/17/2015 1524  TROPONINI 0.54*    Radiological Exams on Admission: Dg Chest Port 1 View  05/19/2015  CLINICAL DATA:  Abdominal pain.  Shortness of breath EXAM: PORTABLE CHEST 1 VIEW COMPARISON:  04/27/2015 FINDINGS: There is reticulation in the lower lungs, left more than right,  compatible with scarring. Chronic mild elevation of the left diaphragm. There is no edema, consolidation, effusion, or pneumothorax. Normal heart size and mediastinal contours. IMPRESSION: Stable.  No evidence of acute disease. Electronically Signed   By: Monte Fantasia M.D.   On: 05/11/2015 16:12    EKG: Independently reviewed. Normal sinus rhythm   Assessment/Plan Active Problems:   PVD (peripheral vascular disease) (HCC)   Candidiasis of skin   Diabetes type 2, uncontrolled (HCC)   Sepsis ([place])   AKI (acute kidney injury) ([place])  Sepsis due to UTI We'll admit the patient to stepdown unit, initiate Azactam per pharmacy consultation Follow urine and blood culture results. Lactate is 2.7. Follow the serial lactate. Aggressive IV hydration with normal saline at 125 mL per hour.  Elevated troponin Patient has troponin elevation of 0.54, no chest pain at this time EKG shows no significant changes. Likely from demand ischemia from hypotension/sepsis as above.  Patient not a candidate for aggressive intervention, discussed with patient's son.  Acute kidney injury Patient has elevated BUN/creatinine 91/4.69, significant elevation of the BUN/creatinine as his baseline is around 23/1.24 as of 04/30/2015.  Hyper kalemia Potassium 5.6, from acute kidney injury. We'll start aggressive IV hydration. We'll not give Kayexalate at this time, as patient hypotensive and had loose stool last night. Follow BMP in a.m.  Diabetes mellitus Continue with Lantus, Sliding scale insulin.  Candidiasis Patient has candidiasis of lower abdominal wall due to the incontinence of urine Start nystatin powder 3 times a day  DVT prophylaxis Heparin    Code status: DO NOT RESUSCITATE  Family discussion: Admission, patients condition and plan of care including tests being ordered have been discussed with the patient and his sons at bedside* who indicate understanding and agree with the plan and Code  Status.   Time Spent on Admission: 60 min  [facility] Pager: [phone] 05/25/2015, 6:13 PM  If 7PM-7AM, please contact night-coverage  [email or web address]  Password [email or web address]

## 2015-05-18 NOTE — ED Notes (Signed)
CRITICAL VALUE ALERT  Critical value received:  Trop 0.54  Date of notification:  05/17/2015  Time of notification:  [id number]  Critical value read back:Yes.    Nurse who received alert:  B. Olena Heckle, RN  MD notified (1st page):  Sabra Heck  Time of first page:  1637  MD notified (2nd page):  Time of second page:  Responding MD:  Sabra Heck  Time MD responded:  [id number]

## 2015-05-18 NOTE — ED Provider Notes (Signed)
CSN: [id number]     Arrival date & time 06/06/2015  1503 History   First MD Initiated Contact with Patient 06/03/2015 1524     Chief Complaint  Patient presents with  . Abdominal Pain     (Consider location/radiation/quality/duration/timing/severity/associated sxs/prior Treatment) HPI Comments: 75 y/o, dementia, recent admission with hypothermia and AMS Now back with ? abd pain / urinary retention, Has had general wasting, FTT, not taking much food - taking fluid OK, some incontinence - stool black, hx of Colon CA. Sx are worsening, no home health services - pt demented - Level 5 caveat.  Patient is a 75 y.o. male presenting with abdominal pain. The history is provided by a relative and medical records.  Abdominal Pain   Past Medical History  Diagnosis Date  . HTN (hypertension)   . DM (diabetes mellitus) ([place])   . Hypothyroidism   . Hernia   . Dizziness   . AAA (abdominal aortic aneurysm) ([place])     Dr. Scot Dock  . Hyperlipidemia   . Leg pain     with walking  . Colon cancer ([place])     adenocarcinoma; s/p R hemicolectomy 8/12  . CAD (coronary artery disease)     EF 57% by Myoview 4/12;  cath 5/12: dLM 50%, pLAD 50%, pCFX 50%, OM1 occluded with distal filling with L-L collats, mRCA 70-75%, dRCA 80%;  severe RCA needs PCI but treated medically due to need for colon surgery and plans to possilby perform PCI in future  . PAD (peripheral artery disease) (HCC)     c/b dry gangrene of toes  . CHF (congestive heart failure) ([place])   . COPD (chronic obstructive pulmonary disease) ([place])   . Dementia   . Wheelchair bound    Past Surgical History  Procedure Laterality Date  . Hernia repair    . Appendectomy    . Cardiac catheterization    . Colon surgery  12/12/10    r/t colon CA  . Abdominal surgery    . Abdominal aortic aneurysm repair  02/17/11    Open AAA repair and Right Fem-Pop  . Colonoscopy  03/14/2012    Procedure: COLONOSCOPY;  Surgeon: Rogene Houston, MD;  [facility];  Service: Endoscopy;  Laterality: N/A;  345-Ann to notify pt of new time   Family History  Problem Relation Age of Onset  . Diabetes Mother   . Heart disease Mother     heart failure  . Heart attack Mother   . Heart disease Brother   . Other Brother 41    MRSA  . COPD Father   . Heart disease Father     heart failure  . COPD Sister    Social History  Substance Use Topics  . Smoking status: Former Smoker -- 2.00 packs/day for 50 years    Types: Cigarettes    Quit date: 12/11/2010  . Smokeless tobacco: Never Used  . Alcohol Use: No    Review of Systems  Unable to perform ROS: Dementia  Gastrointestinal: Positive for abdominal pain.      Allergies  Penicillins  Home Medications   Prior to Admission medications   Medication Sig Start Date End Date Taking? Authorizing Provider  amLODipine (NORVASC) 5 MG tablet Take 5 mg by mouth daily.   Yes Historical Provider, MD  aspirin EC 81 MG tablet Take 81 mg by mouth daily.     Yes Historical Provider, MD  atenolol (TENORMIN) 50 MG tablet Take 50 mg by  mouth daily.     Yes Historical Provider, MD  donepezil (ARICEPT) 10 MG tablet Take 10 mg by mouth at bedtime.    Yes Historical Provider, MD  gabapentin (NEURONTIN) 100 MG capsule Take 100 mg by mouth daily as needed (for neuropathy).  03/27/14  Yes Historical Provider, MD  HUMALOG 100 UNIT/ML injection Inject 3-5 Units into the skin 2 (two) times daily with a meal. Sliding scale 10/27/14  Yes Historical Provider, MD  ibuprofen (ADVIL,MOTRIN) 200 MG tablet Take 200 mg by mouth every 6 (six) hours as needed for mild pain or moderate pain.   Yes Historical Provider, MD  LANTUS 100 UNIT/ML injection Inject 15 Units into the skin at bedtime. Personal sliding scale 03/14/11  Yes Historical Provider, MD  levothyroxine (SYNTHROID, LEVOTHROID) 150 MCG tablet Take 1 tablet (150 mcg total) by mouth daily before breakfast. 04/30/15  Yes Houston Siren, MD  memantine (NAMENDA) 10 MG tablet Take  10 mg by mouth 2 (two) times daily.  12/06/14  Yes Historical Provider, MD  olmesartan (BENICAR) 20 MG tablet Take 20 mg by mouth 2 (two) times daily before a meal.    Yes Historical Provider, MD  potassium chloride SA (K-DUR,KLOR-CON) 20 MEQ tablet Take 1 tablet (20 mEq total) by mouth daily. Patient taking differently: Take 10 mEq by mouth 2 (two) times daily.  04/30/15  Yes Houston Siren, MD  pravastatin (PRAVACHOL) 40 MG tablet Take 40 mg by mouth every morning.  07/08/12  Yes Historical Provider, MD  levofloxacin (LEVAQUIN) 500 MG tablet Take 500 mg by mouth daily. Reported on 05/17/2015 05/06/15   Historical Provider, MD   BP 85/55 mmHg  Pulse 66  Temp(Src) 98.1 F (36.7 C) (Rectal)  Resp 17  Ht 5\' 10"  (1.778 m)  Wt 150 lb (68.04 kg)  BMI 21.52 kg/m2  SpO2 98% Physical Exam  Constitutional: He appears well-developed and well-nourished. No distress.  HENT:  Head: Normocephalic and atraumatic.  Mouth/Throat: No oropharyngeal exudate.  Dry MM  Eyes: Conjunctivae and EOM are normal. Pupils are equal, round, and reactive to light. Right eye exhibits no discharge. Left eye exhibits no discharge. No scleral icterus.  Neck: Normal range of motion. Neck supple. No JVD present. No thyromegaly present.  Cardiovascular: Normal rate, regular rhythm, normal heart sounds and intact distal pulses.  Exam reveals no gallop and no friction rub.   No murmur heard. Pulmonary/Chest: Effort normal and breath sounds normal. No respiratory distress. He has no wheezes. He has no rales.  Upper airway ronchi, no rales or wheezing  Abdominal: Soft. Bowel sounds are normal. He exhibits no distension and no mass. There is no tenderness.  No masses, soft  Genitourinary:  Normal appearing anus - hemo positive on hemoccult - melena present  Musculoskeletal: Normal range of motion. He exhibits no edema or tenderness.  Lymphadenopathy:    He has no cervical adenopathy.  Neurological: He is alert.  Severe generalized  weakness - follows commands   Skin: Skin is warm and dry. No rash noted. No erythema.  Psychiatric: He has a normal mood and affect. His behavior is normal.  Nursing note and vitals reviewed.   ED Course  Procedures (including critical care time) Labs Review Labs Reviewed  CBC WITH DIFFERENTIAL/PLATELET - Abnormal; Notable for the following:    WBC 18.4 (*)    Platelets 135 (*)    Neutro Abs 16.6 (*)    All other components within normal limits  COMPREHENSIVE METABOLIC PANEL - Abnormal; Notable for  the following:    Potassium 5.6 (*)    Chloride 100 (*)    Glucose, Bld 292 (*)    BUN 91 (*)    Creatinine, Ser 4.69 (*)    Calcium 8.4 (*)    Albumin 2.3 (*)    AST 62 (*)    Alkaline Phosphatase 171 (*)    GFR calc non Af Amer 11 (*)    GFR calc Af Amer 13 (*)    All other components within normal limits  URINALYSIS, ROUTINE W REFLEX MICROSCOPIC (NOT AT [facility]) - Abnormal; Notable for the following:    Hgb urine dipstick SMALL (*)    Protein, ur TRACE (*)    Leukocytes, UA SMALL (*)    All other components within normal limits  TROPONIN I - Abnormal; Notable for the following:    Troponin I 0.54 (*)    All other components within normal limits  URINE MICROSCOPIC-ADD ON - Abnormal; Notable for the following:    Squamous Epithelial / LPF 0-5 (*)    Bacteria, UA FEW (*)    All other components within normal limits  POC OCCULT BLOOD, ED - Abnormal; Notable for the following:    Fecal Occult Bld POSITIVE (*)    All other components within normal limits  CULTURE, BLOOD (ROUTINE X 2)  CULTURE, BLOOD (ROUTINE X 2)  URINE CULTURE  LIPASE, BLOOD  PROTIME-INR  APTT  LACTIC ACID, PLASMA  LACTIC ACID, PLASMA  TYPE AND SCREEN    Imaging Review Dg Chest Port 1 View  06/02/2015  CLINICAL DATA:  Abdominal pain.  Shortness of breath EXAM: PORTABLE CHEST 1 VIEW COMPARISON:  04/27/2015 FINDINGS: There is reticulation in the lower lungs, left more than right, compatible with scarring.  Chronic mild elevation of the left diaphragm. There is no edema, consolidation, effusion, or pneumothorax. Normal heart size and mediastinal contours. IMPRESSION: Stable.  No evidence of acute disease. Electronically Signed   By: Marnee Spring M.D.   On: 05/25/2015 16:12   I have personally reviewed and evaluated these images and lab results as part of my medical decision-making.  ED ECG REPORT  I personally interpreted this EKG   Date: 05/10/2015   Rate: 64  Rhythm: normal sinus rhythm  QRS Axis: normal  Intervals: normal  ST/T Wave abnormalities: nonspecific ST/T changes  Conduction Disutrbances:nonspecific intraventricular conduction delay  Narrative Interpretation: PRWP  Old EKG Reviewed: none available   MDM   Final diagnoses:  Sepsis, due to unspecified organism (HCC)  Acute renal failure, unspecified acute renal failure type (HCC)  Melena  UTI (lower urinary tract infection)    Pt is now essentially bed bound - FTT, VS with hypotension - likely GI bleed given hemoccult - possible sepsis withi urinary ret / dark urine - weakness and hypotension - fluid resuc, xray, labs.  Multiple abnormal labs  Acute renal failure High BUN UTI prsent Trop elevated WBC elevated Hypotension persisted - needs jmultiple fluid boluses  D/w Dr. Sharl Ma - admit for sepsis and ARF, possible urinary source.  CRITICAL CARE Performed by: Vida Roller Total critical care time: 35 minutes Critical care time was exclusive of separately billable procedures and treating other patients. Critical care was necessary to treat or prevent imminent or life-threatening deterioration. Critical care was time spent personally by me on the following activities: development of treatment plan with patient and/or surrogate as well as nursing, discussions with consultants, evaluation of patient's response to treatment, examination of patient, obtaining history from patient  or surrogate, ordering and performing  treatments and interventions, ordering and review of laboratory studies, ordering and review of radiographic studies, pulse oximetry and re-evaluation of patient's condition.  Meds given in ED:  Medications  sodium chloride 0.9 % bolus 1,000 mL (0 mLs Intravenous Stopped 05/30/2015 1639)    Followed by  sodium chloride 0.9 % bolus 500 mL (500 mLs Intravenous New Bag/Given 05/11/2015 1746)  aztreonam (AZACTAM) 1 g in dextrose 5 % 50 mL IVPB (not administered)  sodium chloride 0.9 % bolus 1,000 mL (0 mLs Intravenous Stopped 05/17/2015 1748)      Noemi Chapel, MD 05/31/2015 1751

## 2015-05-18 NOTE — ED Notes (Addendum)
Pt's son states that he brought the patient in for abdominal pain.  Pt is non communicative verbally but points to lower abdomen.  Last urinated at 4pm yesterday per son.

## 2015-05-18 NOTE — ED Notes (Signed)
CRITICAL VALUE ALERT  Critical value received:  Lactic 2.7  Date of notification:  05/17/14  Time of notification:  [id number]  Critical value read back:Yes.    Nurse who received alert:  Charmayne Sheer, RN  MD notified (1st page):  Sabra Heck

## 2015-05-18 NOTE — Progress Notes (Signed)
ANTIBIOTIC CONSULT NOTE - INITIAL  Pharmacy Consult for Aztreonam Indication: Sepsis  Allergies  Allergen Reactions  . Penicillins Rash    Has patient had a PCN reaction causing immediate rash, facial/tongue/throat swelling, SOB or lightheadedness with hypotension:unknown Has patient had a PCN reaction causing severe rash involving mucus membranes or skin necrosis:unknown Has patient had a PCN reaction that required hospitalization unknown Has patient had a PCN reaction occurring within the last 10 years:No If all of the above answers are "NO", then may proceed with Cephalosporin use. Pt is not aware of this allergy    Patient Measurements: Height: 5' 10.5" (179.1 cm) Weight: 148 lb 5.9 oz (67.3 kg) IBW/kg (Calculated) : 74.15  Vital Signs: Temp: 98.1 F (36.7 C) (01/10 1844) Temp Source: Rectal (01/10 1844) BP: 93/53 mmHg (01/10 1844) Pulse Rate: 69 (01/10 1844) Intake/Output from previous day:   Intake/Output from this shift:    Labs:  Recent Labs  05/09/2015 1524  WBC 18.4*  HGB 15.5  PLT 135*  CREATININE 4.69*   Estimated Creatinine Clearance: 13.2 mL/min (by C-G formula based on Cr of 4.69). No results for input(s): VANCOTROUGH, VANCOPEAK, VANCORANDOM, GENTTROUGH, GENTPEAK, GENTRANDOM, TOBRATROUGH, TOBRAPEAK, TOBRARND, AMIKACINPEAK, AMIKACINTROU, AMIKACIN in the last 72 hours.   Microbiology: Recent Results (from the past 720 hour(s))  Blood culture (routine x 2)     Status: None   Collection Time: 04/27/15 10:45 PM  Result Value Ref Range Status   Specimen Description BLOOD LEFT ANTECUBITAL  Final   Special Requests   Final    BOTTLES DRAWN AEROBIC AND ANAEROBIC AEB=4CC ANA=2CC   Culture NO GROWTH 5 DAYS  Final   Report Status 05/02/2015 FINAL  Final  Blood culture (routine x 2)     Status: None   Collection Time: 04/27/15 11:00 PM  Result Value Ref Range Status   Specimen Description BLOOD LEFT FOREARM  Final   Special Requests BOTTLES DRAWN AEROBIC AND  ANAEROBIC 4CC EACH  Final   Culture NO GROWTH 5 DAYS  Final   Report Status 05/02/2015 FINAL  Final    Medical History: Past Medical History  Diagnosis Date  . HTN (hypertension)   . DM (diabetes mellitus) ([place])   . Hypothyroidism   . Hernia   . Dizziness   . AAA (abdominal aortic aneurysm) ([place])     Dr. Scot Dock  . Hyperlipidemia   . Leg pain     with walking  . Colon cancer ([place])     adenocarcinoma; s/p R hemicolectomy 8/12  . CAD (coronary artery disease)     EF 57% by Myoview 4/12;  cath 5/12: dLM 50%, pLAD 50%, pCFX 50%, OM1 occluded with distal filling with L-L collats, mRCA 70-75%, dRCA 80%;  severe RCA needs PCI but treated medically due to need for colon surgery and plans to possilby perform PCI in future  . PAD (peripheral artery disease) ([facility])     c/b dry gangrene of toes  . CHF (congestive heart failure) ([place])   . COPD (chronic obstructive pulmonary disease) ([place])   . Dementia   . Wheelchair bound     Medications:  Aztreonam 1gm at 1800  Assessment: 75 yo male who presents with lower abdominal pain. Recently hospitalized for viral gastroenteritis. No fever or SOB. Loss of appetite. Pt has acute renal failure, elevated WBC, LA=2.7, and is afebrile.  Blood and urine cultures obtained. Empiric tx of sepsis with Aztreonam.   Goal of Therapy:  Resolution of infection  Plan: q8h Aztreonam 1gm  IV  Follow up culture results  Monitor V/S and labs  Isac Sarna, BS Vena Austria, BCPS Clinical Pharmacist Pager [phone] 05/30/2015,7:27 PM

## 2015-05-19 DIAGNOSIS — Z466 Encounter for fitting and adjustment of urinary device: Secondary | ICD-10-CM

## 2015-05-19 DIAGNOSIS — L8990 Pressure ulcer of unspecified site, unspecified stage: Secondary | ICD-10-CM | POA: Insufficient documentation

## 2015-05-19 DIAGNOSIS — E875 Hyperkalemia: Secondary | ICD-10-CM

## 2015-05-19 DIAGNOSIS — E1165 Type 2 diabetes mellitus with hyperglycemia: Secondary | ICD-10-CM

## 2015-05-19 DIAGNOSIS — R532 Functional quadriplegia: Secondary | ICD-10-CM | POA: Diagnosis present

## 2015-05-19 DIAGNOSIS — R7989 Other specified abnormal findings of blood chemistry: Secondary | ICD-10-CM

## 2015-05-19 DIAGNOSIS — N471 Phimosis: Secondary | ICD-10-CM

## 2015-05-19 DIAGNOSIS — R338 Other retention of urine: Secondary | ICD-10-CM

## 2015-05-19 DIAGNOSIS — R339 Retention of urine, unspecified: Secondary | ICD-10-CM

## 2015-05-19 DIAGNOSIS — R944 Abnormal results of kidney function studies: Secondary | ICD-10-CM

## 2015-05-19 DIAGNOSIS — N390 Urinary tract infection, site not specified: Secondary | ICD-10-CM | POA: Diagnosis present

## 2015-05-19 DIAGNOSIS — R778 Other specified abnormalities of plasma proteins: Secondary | ICD-10-CM | POA: Diagnosis present

## 2015-05-19 DIAGNOSIS — Z794 Long term (current) use of insulin: Secondary | ICD-10-CM

## 2015-05-19 DIAGNOSIS — E43 Unspecified severe protein-calorie malnutrition: Secondary | ICD-10-CM | POA: Diagnosis present

## 2015-05-19 LAB — TROPONIN I
TROPONIN I: 1.75 ng/mL — AB (ref <0.031)
TROPONIN I: 0.75 ng/mL — AB (ref <0.031)

## 2015-05-19 LAB — COMPREHENSIVE METABOLIC PANEL
ALBUMIN: 1.9 g/dL — AB (ref 3.5–5.0)
ALT: 36 U/L (ref 17–63)
ANION GAP: 12 (ref 5–15)
AST: 74 U/L — ABNORMAL HIGH (ref 15–41)
Alkaline Phosphatase: 196 U/L — ABNORMAL HIGH (ref 38–126)
BILIRUBIN TOTAL: 0.6 mg/dL (ref 0.3–1.2)
BUN: 86 mg/dL — ABNORMAL HIGH (ref 6–20)
CO2: 19 mmol/L — ABNORMAL LOW (ref 22–32)
Calcium: 7.8 mg/dL — ABNORMAL LOW (ref 8.9–10.3)
Chloride: 114 mmol/L — ABNORMAL HIGH (ref 101–111)
Creatinine, Ser: 4.06 mg/dL — ABNORMAL HIGH (ref 0.61–1.24)
GFR calc Af Amer: 15 mL/min — ABNORMAL LOW (ref >60)
GFR, EST NON AFRICAN AMERICAN: 13 mL/min — AB (ref >60)
Glucose, Bld: 199 mg/dL — ABNORMAL HIGH (ref 65–99)
POTASSIUM: 5.4 mmol/L — AB (ref 3.5–5.1)
Sodium: 145 mmol/L (ref 135–145)
TOTAL PROTEIN: 5.5 g/dL — AB (ref 6.5–8.1)

## 2015-05-19 LAB — CBC
HEMATOCRIT: 42.3 % (ref 39.0–52.0)
Hemoglobin: 13.5 g/dL (ref 13.0–17.0)
MCH: 29.2 pg (ref 26.0–34.0)
MCHC: 31.9 g/dL (ref 30.0–36.0)
MCV: 91.6 fL (ref 78.0–100.0)
Platelets: 90 10*3/uL — ABNORMAL LOW (ref 150–400)
RBC: 4.62 MIL/uL (ref 4.22–5.81)
RDW: 14.3 % (ref 11.5–15.5)
WBC: 12.9 10*3/uL — ABNORMAL HIGH (ref 4.0–10.5)

## 2015-05-19 LAB — GLUCOSE, CAPILLARY
GLUCOSE-CAPILLARY: 69 mg/dL (ref 65–99)
Glucose-Capillary: 93 mg/dL (ref 65–99)
Glucose-Capillary: 94 mg/dL (ref 65–99)
Glucose-Capillary: 72 mg/dL (ref 65–99)

## 2015-05-19 LAB — LACTIC ACID, PLASMA
Lactic Acid, Venous: 1.4 mmol/L (ref 0.5–2.0)
Lactic Acid, Venous: 3 mmol/L (ref 0.5–2.0)

## 2015-05-19 LAB — URINE CULTURE

## 2015-05-19 LAB — HEMOGLOBIN A1C
HEMOGLOBIN A1C: 8.2 % — AB (ref 4.8–5.6)
Mean Plasma Glucose: 189 mg/dL

## 2015-05-19 LAB — MRSA PCR SCREENING: MRSA BY PCR: NEGATIVE

## 2015-05-19 MED ORDER — SODIUM CHLORIDE 0.9 % IV BOLUS (SEPSIS)
1000.0000 mL | Freq: Once | INTRAVENOUS | Status: AC
  Administered 2015-05-19: 1000 mL via INTRAVENOUS

## 2015-05-19 MED ORDER — PANTOPRAZOLE SODIUM 40 MG IV SOLR
40.0000 mg | INTRAVENOUS | Status: DC
  Administered 2015-05-19 – 2015-05-20 (×2): 40 mg via INTRAVENOUS
  Filled 2015-05-19 (×2): qty 40

## 2015-05-19 MED ORDER — INSULIN ASPART 100 UNIT/ML ~~LOC~~ SOLN
0.0000 [IU] | SUBCUTANEOUS | Status: DC
  Administered 2015-05-20: 1 [IU] via SUBCUTANEOUS

## 2015-05-19 MED ORDER — LABETALOL HCL 5 MG/ML IV SOLN
INTRAVENOUS | Status: AC
  Filled 2015-05-19: qty 4

## 2015-05-19 MED ORDER — VANCOMYCIN HCL IN DEXTROSE 1-5 GM/200ML-% IV SOLN: 1000.0000 mg | INTRAVENOUS | Status: DC

## 2015-05-19 MED ORDER — DEXTROSE 5 % IV SOLN
INTRAVENOUS | Status: AC
  Filled 2015-05-19: qty 1

## 2015-05-19 MED ORDER — LABETALOL HCL 5 MG/ML IV SOLN: 20.0000 mg | Freq: Once | INTRAVENOUS | Status: DC

## 2015-05-19 MED ORDER — LORAZEPAM 2 MG/ML IJ SOLN
0.5000 mg | Freq: Once | INTRAMUSCULAR | Status: AC
  Administered 2015-05-19: 0.5 mg via INTRAVENOUS
  Filled 2015-05-19: qty 1

## 2015-05-19 MED ORDER — VANCOMYCIN HCL 10 G IV SOLR
1250.0000 mg | Freq: Once | INTRAVENOUS | Status: AC
  Administered 2015-05-19: 1250 mg via INTRAVENOUS
  Filled 2015-05-19: qty 1250

## 2015-05-19 NOTE — Care Management Note (Signed)
Case Management Note  Patient Details  Name: Peter Harvey MRN: [id number] Date of Birth: 10/21/1940  Subjective/Objective:                  Pt admitted with sepsis. Pt is from home, has dementia, lives in his own home with 24/7 PD aids. Pt's son Lennette Bihari) at the bedside to provide information. Pt's family would like [facility] RN to make visits after DC. Palliative consult as been placed. Pt has hospital bed and walker at home. Pt does hot have home O2 prior to admission.   Action/Plan: Will cont to follow.   Expected Discharge Date:  05/22/15               Expected Discharge Plan:  [place]  In-House Referral:  Hospice / Palliative Care  Discharge planning Services  CM Consult  Post Acute Care Choice:  Home Health Choice offered to:     DME Arranged:    DME Agency:     HH Arranged:  RN [place] Agency:     Status of Service:  In process, will continue to follow  Medicare Important Message Given:    Date Medicare IM Given:    Medicare IM give by:    Date Additional Medicare IM Given:    Additional Medicare Important Message give by:     If discussed at [place] of Stay Meetings, dates discussed:    Additional Comments:  Sherald Barge, RN 05/19/2015, 3:22 PM

## 2015-05-19 NOTE — Progress Notes (Signed)
Cooling blanket applied. Patient eyes closed does not open eyes when ask. Flexi-seal removed earlier no stool noted. Family at bedside

## 2015-05-19 NOTE — Progress Notes (Signed)
ANTIBIOTIC CONSULT NOTE - INITIAL  Pharmacy Consult for vancomycin Indication: rule out sepsis  Allergies  Allergen Reactions  . Penicillins Rash    Has patient had a PCN reaction causing immediate rash, facial/tongue/throat swelling, SOB or lightheadedness with hypotension:unknown Has patient had a PCN reaction causing severe rash involving mucus membranes or skin necrosis:unknown Has patient had a PCN reaction that required hospitalization unknown Has patient had a PCN reaction occurring within the last 10 years:No If all of the above answers are "NO", then may proceed with Cephalosporin use. Pt is not aware of this allergy    Patient Measurements: Height: 5' 10.5" (179.1 cm) Weight: 150 lb 5.7 oz (68.2 kg) IBW/kg (Calculated) : 74.15   Vital Signs: Temp: 103.1 F (39.5 C) (01/11 1515) Temp Source: Axillary (01/11 1515) BP: 85/56 mmHg (01/11 1500) Pulse Rate: 87 (01/11 1515) Intake/Output from previous day: 01/10 0701 - 01/11 0700 In: 5045.4 [P.O.:420; I.V.:3025.4; IV Piggyback:1600] Out: 800 [Urine:800] Intake/Output from this shift: Total I/O In: 85.5 [I.V.:35.5; IV Piggyback:50] Out: -   Labs:  Recent Labs  05/16/2015 1524 05/09/2015 2220 05/19/15 0522  WBC 18.4*  --  12.9*  HGB 15.5 13.1 13.5  PLT 135*  --  90*  CREATININE 4.69* 4.29* 4.06*   Estimated Creatinine Clearance: 15.4 mL/min (by C-G formula based on Cr of 4.06). No results for input(s): VANCOTROUGH, VANCOPEAK, VANCORANDOM, GENTTROUGH, GENTPEAK, GENTRANDOM, TOBRATROUGH, TOBRAPEAK, TOBRARND, AMIKACINPEAK, AMIKACINTROU, AMIKACIN in the last 72 hours.   Microbiology: Recent Results (from the past 720 hour(s))  Blood culture (routine x 2)     Status: None   Collection Time: 04/27/15 10:45 PM  Result Value Ref Range Status   Specimen Description BLOOD LEFT ANTECUBITAL  Final   Special Requests   Final    BOTTLES DRAWN AEROBIC AND ANAEROBIC AEB=4CC ANA=2CC   Culture NO GROWTH 5 DAYS  Final   Report  Status 05/02/2015 FINAL  Final  Blood culture (routine x 2)     Status: None   Collection Time: 04/27/15 11:00 PM  Result Value Ref Range Status   Specimen Description BLOOD LEFT FOREARM  Final   Special Requests BOTTLES DRAWN AEROBIC AND ANAEROBIC 4CC EACH  Final   Culture NO GROWTH 5 DAYS  Final   Report Status 05/02/2015 FINAL  Final  Urine culture     Status: None (Preliminary result)   Collection Time: 05/23/2015  3:45 PM  Result Value Ref Range Status   Specimen Description URINE, CATHETERIZED  Final   Special Requests NONE  Final   Culture   Final    TOO YOUNG TO READ Performed at [facility]    Report Status PENDING  Incomplete  Blood Culture (routine x 2)     Status: None (Preliminary result)   Collection Time: 05/19/2015  4:30 PM  Result Value Ref Range Status   Specimen Description BLOOD LEFT ARM  Final   Special Requests BOTTLES DRAWN AEROBIC AND ANAEROBIC 8CC EACH  Final   Culture PENDING  Incomplete   Report Status PENDING  Incomplete  Blood Culture (routine x 2)     Status: None (Preliminary result)   Collection Time: 05/19/2015  4:35 PM  Result Value Ref Range Status   Specimen Description BLOOD LEFT ARM  Final   Special Requests BOTTLES DRAWN AEROBIC AND ANAEROBIC [facility] EACH  Final   Culture PENDING  Incomplete   Report Status PENDING  Incomplete  MRSA PCR Screening     Status: None   Collection Time:  06/01/2015 10:50 PM  Result Value Ref Range Status   MRSA by PCR NEGATIVE NEGATIVE Final    Comment:        The GeneXpert MRSA Assay (FDA approved for NASAL specimens only), is one component of a comprehensive MRSA colonization surveillance program. It is not intended to diagnose MRSA infection nor to guide or monitor treatment for MRSA infections.     Medical History: Past Medical History  Diagnosis Date  . HTN (hypertension)   . DM (diabetes mellitus) ([place])   . Hypothyroidism   . Hernia   . Dizziness   . AAA (abdominal aortic aneurysm) ([place])      Dr. Scot Dock  . Hyperlipidemia   . Leg pain     with walking  . Colon cancer ([place])     adenocarcinoma; s/p R hemicolectomy 8/12  . CAD (coronary artery disease)     EF 57% by Myoview 4/12;  cath 5/12: dLM 50%, pLAD 50%, pCFX 50%, OM1 occluded with distal filling with L-L collats, mRCA 70-75%, dRCA 80%;  severe RCA needs PCI but treated medically due to need for colon surgery and plans to possilby perform PCI in future  . PAD (peripheral artery disease) ([facility])     c/b dry gangrene of toes  . CHF (congestive heart failure) ([place])   . COPD (chronic obstructive pulmonary disease) ([place])   . Dementia   . Wheelchair bound     Medications:  Prescriptions prior to admission  Medication Sig Dispense Refill Last Dose  . amLODipine (NORVASC) 5 MG tablet Take 5 mg by mouth daily.   05/12/2015 at Unknown time  . aspirin EC 81 MG tablet Take 81 mg by mouth daily.     05/19/2015 at Unknown time  . atenolol (TENORMIN) 50 MG tablet Take 50 mg by mouth daily.     05/15/2015 at 1030a  . donepezil (ARICEPT) 10 MG tablet Take 10 mg by mouth at bedtime.    05/17/2015 at Unknown time  . gabapentin (NEURONTIN) 100 MG capsule Take 100 mg by mouth daily as needed (for neuropathy).    unknown  . HUMALOG 100 UNIT/ML injection Inject 3-5 Units into the skin 2 (two) times daily with a meal. Sliding scale   06/07/2015 at Unknown time  . ibuprofen (ADVIL,MOTRIN) 200 MG tablet Take 200 mg by mouth every 6 (six) hours as needed for mild pain or moderate pain.   05/17/2015 at Unknown time  . LANTUS 100 UNIT/ML injection Inject 15 Units into the skin at bedtime. Personal sliding scale   05/17/2015 at Unknown time  . levothyroxine (SYNTHROID, LEVOTHROID) 150 MCG tablet Take 1 tablet (150 mcg total) by mouth daily before breakfast. 30 tablet 1 05/10/2015 at Unknown time  . memantine (NAMENDA) 10 MG tablet Take 10 mg by mouth 2 (two) times daily.    05/29/2015 at Unknown time  . olmesartan (BENICAR) 20 MG tablet Take 20 mg by mouth 2 (two)  times daily before a meal.    05/31/2015 at Unknown time  . potassium chloride SA (K-DUR,KLOR-CON) 20 MEQ tablet Take 1 tablet (20 mEq total) by mouth daily. (Patient taking differently: Take 10 mEq by mouth 2 (two) times daily. ) 30 tablet 1 05/30/2015 at Unknown time  . pravastatin (PRAVACHOL) 40 MG tablet Take 40 mg by mouth every morning.    05/10/2015 at Unknown time  . levofloxacin (LEVAQUIN) 500 MG tablet Take 500 mg by mouth daily. Reported on    Completed Course at Unknown time  Assessment: 75 yo man to start vancomycin for r/o sepsis.  He is also currently receiving aztreonam.  Goal of Therapy:  Vancomycin trough level 15-20 mcg/ml  Plan:  Vancomycin 1250 mg IV X 1 then 1 gm IV q48 hours F/u renal function, cultures and clinical course  Thanks for allowing pharmacy to be a part of this patient's care.  Excell Seltzer, PharmD Clinical Pharmacist  05/19/2015,3:27 PM

## 2015-05-19 NOTE — Progress Notes (Signed)
[facility] PROGRESS NOTE  Peter Harvey [id number] DOB: 1940-12-08 DOA: 06/01/2015 PCP: Leonides Grills, MD  Assessment/Plan: 1. Sepsis secondary to UTI. UC and BC results pending. Lactic acid elevated at 3.0, WBC 12.9. Febrile to 100.7. Will continue IV abx and IVF per sepsis protocol.  2. Elevated troponin, possibly demand ischemia related to hypotension/sepsis. Troponin 1.75 today. No CP, EKG is nonacute. Patient is not a candidate for aggressive intervention.  3. Acute encephalopathy, likely related to hypotension/sepsis. Continue to follow. 4. AKI, likely prerenal related to prolonged hypotension/ARB use. Improving with IVF. Will continue to monitor.  5. Urinary retention, resolved s/p placement of foley catheter by urology. Will need outpatient follow up with urology.  6. Heme-positive stool, son reports hx of colon cancer s/p resection. He is not aware of when patient had colonoscopy. Hgb stable. Will start on protonix, no gross evidence of bleeding. Can follow up with GI as outpatient.  7. Essential HTN, BP fluctuating with periods of hypertension and hypotension. Continue to hold antihypertensives for now.  8. Hyperkalemia, continue IVF and monitor. Received a dose of Kayexalate overnight.  9. Severe protein-calorie malnutrition. Will consult nutrition.  10. Pressure ulcers, flexiseal in place. Continue wound management. 11. Functional quadriplegia.  Patient has been WC/bed bound for 4 years but has been exclusively bed bound for 3 weeks 12. DM type 2, stable. Continue Lantus and SSI.  13. Candidiasis of the lower abdominal wall secondary to urinary incontinence. Continue Nystatin. 14. Dementia, continue Aricept. 15. Discussion: Patient has multiple medical problems and poor functional status. He is critically ill and this point and may not survive this hospitalization. Will request palliative care consultation to discuss goals of care.    Code Status: DNR DVT  prophylaxis: Heparin Family Communication: Son at bedside Disposition Plan: Continue to monitor in ICU   Consultants:  Urology  Procedures:    Antibiotics:  Azactam 1/10>>  HPI/Subjective: Patient is currently sleeping. Per son he did not rest well last night. Son reports recent hospitalization for gastroenteritis. Since discharge he has been declining with poor oral intake.   Objective: Filed Vitals:   05/19/15 0700 05/19/15 0710  BP:  164/97  Pulse: 86 86  Temp:    Resp: 28 28    Intake/Output Summary (Last 24 hours) at 05/19/15 0736 Last data filed at 05/19/15 0700  Gross per 24 hour  Intake 5045.42 ml  Output    200 ml  Net 4845.42 ml   Filed Weights   06/07/2015 1617 05/30/2015 1844 05/19/15 0500  Weight: 68.04 kg (150 lb) 67.3 kg (148 lb 5.9 oz) 68.2 kg (150 lb 5.7 oz)    Exam:  General: NAD, looks comfortable, lethargic Cardiovascular: RRR, S1, S2  Respiratory: Scattered rhonchi bilaterally Abdomen: soft, non tender, no distention , bowel sounds normal Musculoskeletal: No edema b/l   Data Reviewed: Basic Metabolic Panel:  Recent Labs Lab 05/14/2015 1524 05/15/2015 1921 05/24/2015 2220 05/19/15 0522  NA 136  --  134* 145  K 5.6*  --  5.4* 5.4*  CL 100*  --  107 114*  CO2 24  --  19* 19*  GLUCOSE 292*  --  235* 199*  BUN 91*  --  88* 86*  CREATININE 4.69*  --  4.29* 4.06*  CALCIUM 8.4*  --  7.2* 7.8*  MG  --  2.5*  --   --    Liver Function Tests:  Recent Labs Lab 05/17/2015 1524 05/19/15 0522  AST 62* 74*  ALT 30 36  ALKPHOS 171* 196*  BILITOT 0.4 0.6  PROT 6.8 5.5*  ALBUMIN 2.3* 1.9*    Recent Labs Lab 05/25/2015 1524  LIPASE 40    CBC:  Recent Labs Lab 05/19/2015 1524 05/17/2015 2220 05/19/15 0522  WBC 18.4*  --  12.9*  NEUTROABS 16.6*  --   --   HGB 15.5 13.1 13.5  HCT 47.6 41.6 42.3  MCV 90.7  --  91.6  PLT 135*  --  90*   Cardiac Enzymes:  Recent Labs Lab 06/05/2015 1524 05/16/2015 1921 05/19/15 0040 05/19/15 0522   TROPONINI 0.54* 0.30* 0.75* 1.75*     CBG:  Recent Labs Lab 05/30/2015 2130  GLUCAP 189*    Recent Results (from the past 240 hour(s))  Blood Culture (routine x 2)     Status: None (Preliminary result)   Collection Time: 06/01/2015  4:30 PM  Result Value Ref Range Status   Specimen Description BLOOD LEFT ARM  Final   Special Requests BOTTLES DRAWN AEROBIC AND ANAEROBIC 8CC EACH  Final   Culture PENDING  Incomplete   Report Status PENDING  Incomplete  Blood Culture (routine x 2)     Status: None (Preliminary result)   Collection Time: 05/11/2015  4:35 PM  Result Value Ref Range Status   Specimen Description BLOOD LEFT ARM  Final   Special Requests BOTTLES DRAWN AEROBIC AND ANAEROBIC [facility] EACH  Final   Culture PENDING  Incomplete   Report Status PENDING  Incomplete  MRSA PCR Screening     Status: None   Collection Time: 06/01/2015 10:50 PM  Result Value Ref Range Status   MRSA by PCR NEGATIVE NEGATIVE Final    Comment:        The GeneXpert MRSA Assay (FDA approved for NASAL specimens only), is one component of a comprehensive MRSA colonization surveillance program. It is not intended to diagnose MRSA infection nor to guide or monitor treatment for MRSA infections.      Studies: Dg Chest Port 1 View  05/23/2015  CLINICAL DATA:  Abdominal pain.  Shortness of breath EXAM: PORTABLE CHEST 1 VIEW COMPARISON:  04/27/2015 FINDINGS: There is reticulation in the lower lungs, left more than right, compatible with scarring. Chronic mild elevation of the left diaphragm. There is no edema, consolidation, effusion, or pneumothorax. Normal heart size and mediastinal contours. IMPRESSION: Stable.  No evidence of acute disease. Electronically Signed   By: Monte Fantasia M.D.   On: 05/09/2015 16:12    Scheduled Meds: . sodium chloride   Intravenous STAT  . antiseptic oral rinse  7 mL Mouth Rinse q12n4p  . aspirin EC  81 mg Oral Daily  . aztreonam  1 g Intravenous Q8H  . chlorhexidine  15 mL  Mouth Rinse BID  . donepezil  10 mg Oral QHS  . heparin  5,000 Units Subcutaneous 3 times per day  . insulin aspart  0-9 Units Subcutaneous TID WC  . insulin glargine  15 Units Subcutaneous QHS  . levothyroxine  150 mcg Oral QAC breakfast  . memantine  10 mg Oral BID  . nystatin   Topical TID  . pravastatin  40 mg Oral q1800   Continuous Infusions: . sodium chloride 10 mL/hr at 05/19/15 0600    Active Problems:   PVD (peripheral vascular disease) ([place])   Candidiasis of skin   Diabetes type 2, uncontrolled ([place])   Sepsis ([place])   AKI (acute kidney injury) ([place])    Time spent: 30 minutes    Barbaraann Avans.  MD  [facility] Pager [phone]. If 7PM-7AM, please contact night-coverage at [email or web address], password [facility] 05/19/2015, 7:36 AM  LOS: 1 day     By signing my name below, I, Rosalie Doctor, attest that this documentation has been prepared under the direction and in the presence of [facility]. MD Electronically Signed: Rosalie Doctor, Scribe. 05/19/2015 9:46am  I, Dr. Kathie Dike, personally performed the services described in this documentaiton. All medical record entries made by the scribe were at my direction and in my presence. I have reviewed the chart and agree that the record reflects my personal performance and is accurate and complete  Kathie Dike, MD, 05/19/2015 10:56 AM

## 2015-05-19 NOTE — Progress Notes (Signed)
NIGHT:  CTSP re no urine output. Patient was admitted for sepsis, and he has been hypotensive, but mentating well.   Has slight distal hypoperfusion on both feet. BP was 85 systolic.  He was given 1 liter of IVF, and bring his BP to about [id number] systolic. However, he was having some crackles at both bases, so IVF was stopped.  Bladder scan showed 700cc. I attempted to place a Coude foley, but it coiled and I wasn't able to pass thru. I spoke with Dr McDiarmid and he will come early this morning to help place the foley (Thank you!!). His son was in the room and was updated with the care.     Orvan Falconer, MD  FACP.

## 2015-05-19 NOTE — Progress Notes (Signed)
Initial Nutrition Assessment  DOCUMENTATION CODES:   Severe malnutrition in context of chronic illness  INTERVENTION:  When pt is more awake offer Ensure as desired and follow up family decisions regarding direction of care delivery  NUTRITION DIAGNOSIS:   Inadequate oral intake related to lethargy/confusion as evidenced by meal completion < 50%, per patient/family report.   GOAL: Honor pt/family desires for healthcare provision     REASON FOR ASSESSMENT:   Consult Assessment of nutrition requirement/status  ASSESSMENT: Pt presents septic due to UTI. Pt is sleeping and his daughter-in-law is here. She says he has been sleeping since 6 am this morning. She is his primary caregiver and says pt has been declining (not eating and sleeping more) since his hospitalization around Christmas. His weight is down 6% past month from usual wt and energy intake has been </=75% for >/= 1 month. Unable to complete full NFPE  but has moderate loss of fat mass and muscle temporal and orbital areas.    Diet Order:  Diet Heart Room service appropriate?: Yes; Fluid consistency:: Thin  Skin:   Stage II to sacrum and coccyx  Last BM:   05/19/2015   Height:   Ht Readings from Last 1 Encounters:  05/13/2015 5' 10.5" (1.791 m)    Weight:   Wt Readings from Last 1 Encounters:  05/19/15 150 lb 5.7 oz (68.2 kg)    Ideal Body Weight:  78 kg  BMI:  Body mass index is 21.26 kg/(m^2).  Estimated Nutritional Needs:   Kcal:  [id number] (if goal is to meet 100% of est  needs)  Protein:  80 gr  Fluid:  1.7 liters daily  EDUCATION NEEDS:   No education needs identified at this time  Colman Cater MS,RD,CSG,LDN Office: [phone] Pager: [phone]

## 2015-05-19 NOTE — Consult Note (Signed)
Urology Consult  Referring physician: Georgiann Mohs Reason for referral: Unable to insert catheter  Chief Complaint: Unable to insert catheter  History of Present Illness: Admitted to ICU with sepsis; unable to insert catheter; multiple medical co-morbidities; Cr 4.69; according to son could not void as well over 48 hours but no real GU history; no GU surgery or stones SOB and not complaining of acute retention abdominal pain troponins elevated Modifying factors: There are no other modifying factors  Associated signs and symptoms: There are no other associated signs and symptoms Aggravating and relieving factors: There are no other aggravating or relieving factors Severity: Moderate Duration: Persistent   Past Medical History  Diagnosis Date  . HTN (hypertension)   . DM (diabetes mellitus) ([place])   . Hypothyroidism   . Hernia   . Dizziness   . AAA (abdominal aortic aneurysm) ([place])     Dr. Scot Dock  . Hyperlipidemia   . Leg pain     with walking  . Colon cancer ([place])     adenocarcinoma; s/p R hemicolectomy 8/12  . CAD (coronary artery disease)     EF 57% by Myoview 4/12;  cath 5/12: dLM 50%, pLAD 50%, pCFX 50%, OM1 occluded with distal filling with L-L collats, mRCA 70-75%, dRCA 80%;  severe RCA needs PCI but treated medically due to need for colon surgery and plans to possilby perform PCI in future  . PAD (peripheral artery disease) (H[facility])     c/b dry gangrene of toes  . CHF (congestive heart failure) ([place])   . COPD (chronic obstructive pulmonary disease) ([place])   . Dementia   . Wheelchair bound    Past Surgical History  Procedure Laterality Date  . Hernia repair    . Appendectomy    . Cardiac catheterization    . Colon surgery  12/12/10    r/t colon CA  . Abdominal surgery    . Abdominal aortic aneurysm repair  02/17/11    Open AAA repair and Right Fem-Pop  . Colonoscopy  03/14/2012    Procedure: COLONOSCOPY;  Surgeon: Rogene Houston, MD;  Location: [facility];  Service:  Endoscopy;  Laterality: N/A;  345-Ann to notify pt of new time    Medications: I have reviewed the patient's current medications. Allergies:  Allergies  Allergen Reactions  . Penicillins Rash    Has patient had a PCN reaction causing immediate rash, facial/tongue/throat swelling, SOB or lightheadedness with hypotension:unknown Has patient had a PCN reaction causing severe rash involving mucus membranes or skin necrosis:unknown Has patient had a PCN reaction that required hospitalization unknown Has patient had a PCN reaction occurring within the last 10 years:No If all of the above answers are "NO", then may proceed with Cephalosporin use. Pt is not aware of this allergy    Family History  Problem Relation Age of Onset  . Diabetes Mother   . Heart disease Mother     heart failure  . Heart attack Mother   . Heart disease Brother   . Other Brother 8    MRSA  . COPD Father   . Heart disease Father     heart failure  . COPD Sister    Social History:  reports that he quit smoking about 4 years ago. His smoking use included Cigarettes. He has a 100 pack-year smoking history. He has never used smokeless tobacco. He reports that he does not drink alcohol or use illicit drugs.  ROS: All systems are reviewed and negative except as  noted. Rest negative  Physical Exam:  Vital signs in last 24 hours: Temp:  [97 F (36.1 C)-98.1 F (36.7 C)] 97 F (36.1 C) (01/10 2000) Pulse Rate:  [58-87] 87 (01/11 0500) Resp:  [13-28] 25 (01/11 0500) BP: (66-148)/(29-118) 148/118 mmHg (01/11 0450) SpO2:  [75 %-100 %] 99 % (01/11 0500) Weight:  [67.3 kg (148 lb 5.9 oz)-68.04 kg (150 lb)] 67.3 kg (148 lb 5.9 oz) (01/10 1844)  Cardiovascular: Skin warm; not flushed Respiratory: Breaths quiet; no shortness of breath Abdomen: No masses Neurological: Normal sensation to touch Musculoskeletal: Normal motor function arms and legs Lymphatics: No inguinal adenopathy Skin: No  rashes Genitourinary:phimosis and diffuse fungal? Perineal/penile skin infection  Laboratory Data:  Results for orders placed or performed during the hospital encounter of 05/09/2015 (from the past 72 hour(s))  CBC with Differential     Status: Abnormal   Collection Time: 05/22/2015  3:24 PM  Result Value Ref Range   WBC 18.4 (H) 4.0 - 10.5 K/uL   RBC 5.25 4.22 - 5.81 MIL/uL   Hemoglobin 15.5 13.0 - 17.0 g/dL   HCT 47.6 39.0 - 52.0 %   MCV 90.7 78.0 - 100.0 fL   MCH 29.5 26.0 - 34.0 pg   MCHC 32.6 30.0 - 36.0 g/dL   RDW 14.2 11.5 - 15.5 %   Platelets 135 (L) 150 - 400 K/uL   Neutrophils Relative % 90 %   Neutro Abs 16.6 (H) 1.7 - 7.7 K/uL   Lymphocytes Relative 5 %   Lymphs Abs 0.9 0.7 - 4.0 K/uL   Monocytes Relative 5 %   Monocytes Absolute 0.9 0.1 - 1.0 K/uL   Eosinophils Relative 0 %   Eosinophils Absolute 0.0 0.0 - 0.7 K/uL   Basophils Relative 0 %   Basophils Absolute 0.0 0.0 - 0.1 K/uL  Lipase, blood     Status: None   Collection Time: 05/09/2015  3:24 PM  Result Value Ref Range   Lipase 40 11 - 51 U/L  Comprehensive metabolic panel     Status: Abnormal   Collection Time: 06/03/2015  3:24 PM  Result Value Ref Range   Sodium 136 135 - 145 mmol/L   Potassium 5.6 (H) 3.5 - 5.1 mmol/L   Chloride 100 (L) 101 - 111 mmol/L   CO2 24 22 - 32 mmol/L   Glucose, Bld 292 (H) 65 - 99 mg/dL   BUN 91 (H) 6 - 20 mg/dL   Creatinine, Ser 4.69 (H) 0.61 - 1.24 mg/dL   Calcium 8.4 (L) 8.9 - 10.3 mg/dL   Total Protein 6.8 6.5 - 8.1 g/dL   Albumin 2.3 (L) 3.5 - 5.0 g/dL   AST 62 (H) 15 - 41 U/L   ALT 30 17 - 63 U/L   Alkaline Phosphatase 171 (H) 38 - 126 U/L   Total Bilirubin 0.4 0.3 - 1.2 mg/dL   GFR calc non Af Amer 11 (L) >60 mL/min   GFR calc Af Amer 13 (L) >60 mL/min    Comment: (NOTE) The eGFR has been calculated using the CKD EPI equation. This calculation has not been validated in all clinical situations. eGFR's persistently <60 mL/min signify possible Chronic Kidney Disease.     Anion gap 12 5 - 15  Troponin I     Status: Abnormal   Collection Time: 05/17/2015  3:24 PM  Result Value Ref Range   Troponin I 0.54 (HH) <0.031 ng/mL    Comment:        POSSIBLE MYOCARDIAL  ISCHEMIA. SERIAL TESTING RECOMMENDED. CRITICAL RESULT CALLED TO, READ BACK BY AND VERIFIED WITH: DANIELS,B ON 05/25/2015 AT 1640 BY LOY,C   Urinalysis, Routine w reflex microscopic (not at [facility])     Status: Abnormal   Collection Time: 06/07/2015  3:45 PM  Result Value Ref Range   Color, Urine YELLOW YELLOW   APPearance CLEAR CLEAR   Specific Gravity, Urine 1.025 1.005 - 1.030   pH 5.5 5.0 - 8.0   Glucose, UA NEGATIVE NEGATIVE mg/dL   Hgb urine dipstick SMALL (A) NEGATIVE   Bilirubin Urine NEGATIVE NEGATIVE   Ketones, ur NEGATIVE NEGATIVE mg/dL   Protein, ur TRACE (A) NEGATIVE mg/dL   Nitrite NEGATIVE NEGATIVE   Leukocytes, UA SMALL (A) NEGATIVE  Urine microscopic-add on     Status: Abnormal   Collection Time: 05/10/2015  3:45 PM  Result Value Ref Range   Squamous Epithelial / LPF 0-5 (A) NONE SEEN   WBC, UA TOO NUMEROUS TO COUNT 0 - 5 WBC/hpf   RBC / HPF 0-5 0 - 5 RBC/hpf   Bacteria, UA FEW (A) NONE SEEN   Urine-Other YEAST PRESENT   POC occult blood, ED     Status: Abnormal   Collection Time: 06/05/2015  3:46 PM  Result Value Ref Range   Fecal Occult Bld POSITIVE (A) NEGATIVE  Blood Culture (routine x 2)     Status: None (Preliminary result)   Collection Time: 05/26/2015  4:30 PM  Result Value Ref Range   Specimen Description BLOOD LEFT ARM    Special Requests BOTTLES DRAWN AEROBIC AND ANAEROBIC 8CC EACH    Culture PENDING    Report Status PENDING   Type and screen     Status: None   Collection Time: 05/25/2015  4:30 PM  Result Value Ref Range   ABO/RH(D) B POS    Antibody Screen NEG    Sample Expiration 2015/06/07   Protime-INR     Status: None   Collection Time: 06/08/2015  4:30 PM  Result Value Ref Range   Prothrombin Time 15.1 11.6 - 15.2 seconds   INR 1.17 0.00 - 1.49  APTT      Status: None   Collection Time: 05/29/2015  4:30 PM  Result Value Ref Range   aPTT 33 24 - 37 seconds  Lactic acid, plasma     Status: Abnormal   Collection Time: 05/17/2015  4:30 PM  Result Value Ref Range   Lactic Acid, Venous 2.7 (HH) 0.5 - 2.0 mmol/L    Comment: CRITICAL RESULT CALLED TO, READ BACK BY AND VERIFIED WITH: ROBINSON,L ON 05/11/2015 AT 1750 BY LOY,C   Blood Culture (routine x 2)     Status: None (Preliminary result)   Collection Time: 06/07/2015  4:35 PM  Result Value Ref Range   Specimen Description BLOOD LEFT ARM    Special Requests BOTTLES DRAWN AEROBIC AND ANAEROBIC 6CC EACH    Culture PENDING    Report Status PENDING   Lactic acid, plasma     Status: None   Collection Time: 05/09/2015  7:21 PM  Result Value Ref Range   Lactic Acid, Venous 1.5 0.5 - 2.0 mmol/L  Troponin I     Status: Abnormal   Collection Time: 05/23/2015  7:21 PM  Result Value Ref Range   Troponin I 0.30 (H) <0.031 ng/mL    Comment:        PERSISTENTLY INCREASED TROPONIN VALUES IN THE RANGE OF 0.04-0.49 ng/mL CAN BE SEEN IN:       -  UNSTABLE ANGINA       -CONGESTIVE HEART FAILURE       -MYOCARDITIS       -CHEST TRAUMA       -ARRYHTHMIAS       -LATE PRESENTING MYOCARDIAL INFARCTION       -COPD   CLINICAL FOLLOW-UP RECOMMENDED.   Magnesium     Status: Abnormal   Collection Time: 05/17/2015  7:21 PM  Result Value Ref Range   Magnesium 2.5 (H) 1.7 - 2.4 mg/dL  Glucose, capillary     Status: Abnormal   Collection Time: 06/05/2015  9:30 PM  Result Value Ref Range   Glucose-Capillary 189 (H) 65 - 99 mg/dL  Hemoglobin and hematocrit, blood     Status: None   Collection Time: 05/09/2015 10:20 PM  Result Value Ref Range   Hemoglobin 13.1 13.0 - 17.0 g/dL   HCT 41.6 39.0 - 07.3 %  Basic metabolic panel     Status: Abnormal   Collection Time:  10:20 PM  Result Value Ref Range   Sodium 134 (L) 135 - 145 mmol/L   Potassium 5.4 (H) 3.5 - 5.1 mmol/L   Chloride 107 101 - 111 mmol/L   CO2 19 (L) 22 -  32 mmol/L   Glucose, Bld 235 (H) 65 - 99 mg/dL   BUN 88 (H) 6 - 20 mg/dL   Creatinine, Ser 4.29 (H) 0.61 - 1.24 mg/dL   Calcium 7.2 (L) 8.9 - 10.3 mg/dL   GFR calc non Af Amer 12 (L) >60 mL/min   GFR calc Af Amer 14 (L) >60 mL/min    Comment: (NOTE) The eGFR has been calculated using the CKD EPI equation. This calculation has not been validated in all clinical situations. eGFR's persistently <60 mL/min signify possible Chronic Kidney Disease.    Anion gap 8 5 - 15  MRSA PCR Screening     Status: None   Collection Time: 05/17/2015 10:50 PM  Result Value Ref Range   MRSA by PCR NEGATIVE NEGATIVE    Comment:        The GeneXpert MRSA Assay (FDA approved for NASAL specimens only), is one component of a comprehensive MRSA colonization surveillance program. It is not intended to diagnose MRSA infection nor to guide or monitor treatment for MRSA infections.   Troponin I     Status: Abnormal   Collection Time: 05/19/15 12:40 AM  Result Value Ref Range   Troponin I 0.75 (HH) <0.031 ng/mL    Comment:        POSSIBLE MYOCARDIAL ISCHEMIA. SERIAL TESTING RECOMMENDED. CRITICAL RESULT CALLED TO, READ BACK BY AND VERIFIED WITH: MARTIN S AT 0113 ON [id number] BY FORSYTH K   Lactic acid, plasma     Status: None   Collection Time: 05/19/15 12:40 AM  Result Value Ref Range   Lactic Acid, Venous 1.4 0.5 - 2.0 mmol/L  CBC     Status: Abnormal (Preliminary result)   Collection Time: 05/19/15  5:22 AM  Result Value Ref Range   WBC 12.9 (H) 4.0 - 10.5 K/uL   RBC 4.62 4.22 - 5.81 MIL/uL   Hemoglobin 13.5 13.0 - 17.0 g/dL   HCT 42.3 39.0 - 52.0 %   MCV 91.6 78.0 - 100.0 fL   MCH 29.2 26.0 - 34.0 pg   MCHC 31.9 30.0 - 36.0 g/dL   RDW 14.3 11.5 - 15.5 %   Platelets PENDING 150 - 400 K/uL  Lactic acid, plasma     Status: Abnormal  Collection Time: 05/19/15  5:22 AM  Result Value Ref Range   Lactic Acid, Venous 3.0 (HH) 0.5 - 2.0 mmol/L    Comment: CRITICAL RESULT CALLED TO, READ BACK BY AND  VERIFIED WITH: NEILSON,T AT 6:10AM ON 05/19/15 BY FESTERMAN,C    Recent Results (from the past 240 hour(s))  Blood Culture (routine x 2)     Status: None (Preliminary result)   Collection Time: 05/25/2015  4:30 PM  Result Value Ref Range Status   Specimen Description BLOOD LEFT ARM  Final   Special Requests BOTTLES DRAWN AEROBIC AND ANAEROBIC 8CC EACH  Final   Culture PENDING  Incomplete   Report Status PENDING  Incomplete  Blood Culture (routine x 2)     Status: None (Preliminary result)   Collection Time: 05/28/2015  4:35 PM  Result Value Ref Range Status   Specimen Description BLOOD LEFT ARM  Final   Special Requests BOTTLES DRAWN AEROBIC AND ANAEROBIC [facility] EACH  Final   Culture PENDING  Incomplete   Report Status PENDING  Incomplete  MRSA PCR Screening     Status: None   Collection Time: 05/17/2015 10:50 PM  Result Value Ref Range Status   MRSA by PCR NEGATIVE NEGATIVE Final    Comment:        The GeneXpert MRSA Assay (FDA approved for NASAL specimens only), is one component of a comprehensive MRSA colonization surveillance program. It is not intended to diagnose MRSA infection nor to guide or monitor treatment for MRSA infections.    Creatinine:  Recent Labs  05/29/2015 1524 05/10/2015 2220  CREATININE 4.69* 4.29*    Xrays: See report/chart none  Impression/Assessment:  Phimosis making it difficult to pass catheter  Plan:  14 Fr Coude went in nicely into bladder and urine clear Leave foley in until ready to go home and give voiding trial Treat dermatitis See Urology PRN; consult [place] again if fails trial of voiding Thanks  Eron Staat A 05/19/2015, 6:10 AM

## 2015-05-19 NOTE — Progress Notes (Signed)
Shift Note:  Patient had several dark bowel movement to his pressure ulcers and excoriated skin - flexisel placed.  Patient had accumulated urinary retention with IV bolus for dehydration and low blood pressures (see Flowsheet).  Urinary MD placed coude foley after unsuccessful attempt by nursing staff and hospitalist.  The patient was short of breath with increasing IVF and MD adjusted the rate and seen patient on the floor.  Will continue to monitor.

## 2015-05-19 NOTE — Clinical Social Work Note (Signed)
CSW received consult that family was interested in hospice. CM notified and will follow up. If Hospice Home appropriate, please reconsult CSW.  Benay Pike, [place]

## 2015-05-20 ENCOUNTER — Encounter (HOSPITAL_COMMUNITY): Payer: Self-pay | Admitting: Primary Care

## 2015-05-20 DIAGNOSIS — D696 Thrombocytopenia, unspecified: Secondary | ICD-10-CM | POA: Diagnosis present

## 2015-05-20 DIAGNOSIS — N179 Acute kidney failure, unspecified: Secondary | ICD-10-CM

## 2015-05-20 DIAGNOSIS — A419 Sepsis, unspecified organism: Secondary | ICD-10-CM

## 2015-05-20 DIAGNOSIS — Z7189 Other specified counseling: Secondary | ICD-10-CM

## 2015-05-20 DIAGNOSIS — R532 Functional quadriplegia: Secondary | ICD-10-CM

## 2015-05-20 DIAGNOSIS — Z515 Encounter for palliative care: Secondary | ICD-10-CM

## 2015-05-20 DIAGNOSIS — E43 Unspecified severe protein-calorie malnutrition: Secondary | ICD-10-CM

## 2015-05-20 DIAGNOSIS — F0390 Unspecified dementia without behavioral disturbance: Secondary | ICD-10-CM

## 2015-05-20 LAB — CBC
HCT: 33.9 % — ABNORMAL LOW (ref 39.0–52.0)
Hemoglobin: 10.8 g/dL — ABNORMAL LOW (ref 13.0–17.0)
MCH: 29 pg (ref 26.0–34.0)
MCHC: 31.9 g/dL (ref 30.0–36.0)
MCV: 91.1 fL (ref 78.0–100.0)
PLATELETS: 77 10*3/uL — AB (ref 150–400)
RBC: 3.72 MIL/uL — ABNORMAL LOW (ref 4.22–5.81)
RDW: 14.7 % (ref 11.5–15.5)
WBC: 12.1 10*3/uL — ABNORMAL HIGH (ref 4.0–10.5)

## 2015-05-20 LAB — COMPREHENSIVE METABOLIC PANEL
ALK PHOS: 160 U/L — AB (ref 38–126)
ALT: 41 U/L (ref 17–63)
ANION GAP: 10 (ref 5–15)
AST: 91 U/L — ABNORMAL HIGH (ref 15–41)
Albumin: 1.5 g/dL — ABNORMAL LOW (ref 3.5–5.0)
BUN: 88 mg/dL — ABNORMAL HIGH (ref 6–20)
CALCIUM: 7.5 mg/dL — AB (ref 8.9–10.3)
CHLORIDE: 119 mmol/L — AB (ref 101–111)
CO2: 16 mmol/L — AB (ref 22–32)
Creatinine, Ser: 4.15 mg/dL — ABNORMAL HIGH (ref 0.61–1.24)
GFR calc non Af Amer: 13 mL/min — ABNORMAL LOW (ref >60)
GFR, EST AFRICAN AMERICAN: 15 mL/min — AB (ref >60)
Glucose, Bld: 123 mg/dL — ABNORMAL HIGH (ref 65–99)
POTASSIUM: 4.3 mmol/L (ref 3.5–5.1)
SODIUM: 145 mmol/L (ref 135–145)
Total Bilirubin: 0.6 mg/dL (ref 0.3–1.2)
Total Protein: 4.4 g/dL — ABNORMAL LOW (ref 6.5–8.1)

## 2015-05-20 LAB — GLUCOSE, CAPILLARY
GLUCOSE-CAPILLARY: 111 mg/dL — AB (ref 65–99)
Glucose-Capillary: 114 mg/dL — ABNORMAL HIGH (ref 65–99)
Glucose-Capillary: 121 mg/dL — ABNORMAL HIGH (ref 65–99)
Glucose-Capillary: 67 mg/dL (ref 65–99)
Glucose-Capillary: 91 mg/dL (ref 65–99)

## 2015-05-20 MED ORDER — MORPHINE SULFATE (CONCENTRATE) 10 MG/0.5ML PO SOLN
2.5000 mg | ORAL | Status: DC | PRN
  Administered 2015-05-20: 2.6 mg via SUBLINGUAL
  Filled 2015-05-20 (×2): qty 0.5

## 2015-05-20 MED ORDER — DEXTROSE 50 % IV SOLN
INTRAVENOUS | Status: AC
  Administered 2015-05-20: 50 mL
  Filled 2015-05-20: qty 50

## 2015-05-20 MED ORDER — MORPHINE SULFATE (PF) 2 MG/ML IV SOLN
0.5000 mg | INTRAVENOUS | Status: DC | PRN
  Administered 2015-05-20 (×2): 0.5 mg via INTRAVENOUS
  Filled 2015-05-20 (×2): qty 1

## 2015-05-20 MED ORDER — ATROPINE SULFATE 1 % OP SOLN
1.0000 [drp] | Freq: Three times a day (TID) | OPHTHALMIC | Status: DC
  Administered 2015-05-20: 1 [drp] via SUBLINGUAL
  Filled 2015-05-20: qty 2

## 2015-05-20 NOTE — Progress Notes (Signed)
Stool leakage noted from rectum. Patient cleaned and reposition. Accessory muscles used with breathing increased breathing effort. Moaning noted when being cleaned. Medicated with Morphine SL. Family at bedside.

## 2015-05-20 NOTE — Progress Notes (Signed)
[facility] PROGRESS NOTE  Peter Harvey [id number] DOB: 03/20/1941 DOA: 05/22/2015 PCP: Leonides Grills, MD  Assessment/Plan: 1. Sepsis secondary to UTI. UC and BC results pending. Lactic acid elevated at 3.0, WBC 12.1. Currently afebrile but did have a fever of 103 last night. Will continue IV abx and IVF per sepsis protocol.  2. Elevated troponin, possibly demand ischemia related to hypotension/sepsis. Troponin 1.75 yesterday. No CP, EKG is nonacute. Patient is not a candidate for aggressive intervention.  3. Acute encephalopathy, likely related to hypotension/sepsis. Continue to follow. 4. AKI, likely prerenal related to prolonged hypotension/ARB use. Creatinine has not improved today. Will continue IVF and monitor. UOP has been unimpressive. 5. Urinary retention, resolved s/p placement of foley catheter by urology. Will need outpatient follow up with urology.  6. Heme-positive stool, son reports hx of colon cancer s/p resection. He is not aware of when patient had colonoscopy. Hgb stable. Will start on protonix, no gross evidence of bleeding. Can follow up with GI as outpatient.  7. Essential HTN, patient hypotensive. Continue to hold antihypertensives for now.  8. Hyperkalemia, resolved. 9. Severe protein-calorie malnutrition. Nutrition consulted. 10. Pressure ulcers, flexiseal in place. Continue wound management. 11. Functional quadriplegia.  Patient has been WC/bed bound for 4 years but has been exclusively bed bound for 3 weeks 12. DM type 2, stable. Continue Lantus and SSI.  13. Candidiasis of the lower abdominal wall secondary to urinary incontinence. Continue Nystatin. 14. Dementia, continue Aricept. 15. Discussion: Patient has multiple medical problems and poor functional status. He is critically ill and thus far has not responded to aggressive treatments. It is possible that he may not survive this hospitalization. Pursuing comfort care would be very reasonable in  this situation. Will request palliative care consultation to discuss goals of care.    Code Status: DNR DVT prophylaxis: Heparin Family Communication: Son at bedside Disposition Plan: Prognosis poor, dispo pending PMT consultation   Consultants:  Urology  PMT  Procedures:    Antibiotics:  Azactam 1/10>>  Vancomycin 1/11>>  HPI/Subjective:  Speech is unintelligible but he denies any pain.   Objective: Filed Vitals:   05/24/2015 0500 05/16/2015 0600  BP: 77/49 86/54  Pulse: 67 76  Temp:    Resp: 18 19    Intake/Output Summary (Last 24 hours) at 05/13/2015 0737 Last data filed at 05/24/2015 0500  Gross per 24 hour  Intake 2741.75 ml  Output    550 ml  Net 2191.75 ml   Filed Weights   05/15/2015 1617 06/05/2015 1844 05/19/15 0500  Weight: 68.04 kg (150 lb) 67.3 kg (148 lb 5.9 oz) 68.2 kg (150 lb 5.7 oz)    Exam:  General: NAD, looks comfortable, lethargic Cardiovascular: RRR, S1, S2  Respiratory: Scattered rhonchi, crackles at the bases bilaterally Abdomen: soft, non tender, no distention , bowel sounds normal Musculoskeletal: No edema b/l   Data Reviewed: Basic Metabolic Panel:  Recent Labs Lab  1524 05/19/2015 1921 05/27/2015 2220 05/19/15 0522 06/04/2015 0420  NA 136  --  134* 145 145  K 5.6*  --  5.4* 5.4* 4.3  CL 100*  --  107 114* 119*  CO2 24  --  19* 19* 16*  GLUCOSE 292*  --  235* 199* 123*  BUN 91*  --  88* 86* 88*  CREATININE 4.69*  --  4.29* 4.06* 4.15*  CALCIUM 8.4*  --  7.2* 7.8* 7.5*  MG  --  2.5*  --   --   --  Liver Function Tests:  Recent Labs Lab 05/28/2015 1524 05/19/15 0522 05/29/2015 0420  AST 62* 74* 91*  ALT 30 36 41  ALKPHOS 171* 196* 160*  BILITOT 0.4 0.6 0.6  PROT 6.8 5.5* 4.4*  ALBUMIN 2.3* 1.9* 1.5*    Recent Labs Lab 06/07/2015 1524  LIPASE 40    CBC:  Recent Labs Lab 06/07/2015 1524 05/12/2015 2220 05/19/15 0522 06/04/2015 0420  WBC 18.4*  --  12.9* 12.1*  NEUTROABS 16.6*  --   --   --   HGB 15.5 13.1  13.5 10.8*  HCT 47.6 41.6 42.3 33.9*  MCV 90.7  --  91.6 91.1  PLT 135*  --  90* 77*   Cardiac Enzymes:  Recent Labs Lab 05/30/2015 1524 05/24/2015 1921 05/19/15 0040 05/19/15 0522  TROPONINI 0.54* 0.30* 0.75* 1.75*     CBG:  Recent Labs Lab 05/19/15 1112 05/19/15 1529 05/19/15 2039 05/23/2015 0017 05/10/2015 0422  GLUCAP 94 69 72 67 111*    Recent Results (from the past 240 hour(s))  Urine culture     Status: None   Collection Time:   3:45 PM  Result Value Ref Range Status   Specimen Description URINE, CATHETERIZED  Final   Special Requests NONE  Final   Culture   Final    >=100,000 COLONIES/mL YEAST Performed at [facility]    Report Status 05/19/2015 FINAL  Final  Blood Culture (routine x 2)     Status: None (Preliminary result)   Collection Time: 05/31/2015  4:30 PM  Result Value Ref Range Status   Specimen Description BLOOD LEFT ARM  Final   Special Requests BOTTLES DRAWN AEROBIC AND ANAEROBIC 8CC EACH  Final   Culture PENDING  Incomplete   Report Status PENDING  Incomplete  Blood Culture (routine x 2)     Status: None (Preliminary result)   Collection Time: 05/10/2015  4:35 PM  Result Value Ref Range Status   Specimen Description BLOOD LEFT ARM  Final   Special Requests BOTTLES DRAWN AEROBIC AND ANAEROBIC [facility] EACH  Final   Culture PENDING  Incomplete   Report Status PENDING  Incomplete  MRSA PCR Screening     Status: None   Collection Time: 05/26/2015 10:50 PM  Result Value Ref Range Status   MRSA by PCR NEGATIVE NEGATIVE Final    Comment:        The GeneXpert MRSA Assay (FDA approved for NASAL specimens only), is one component of a comprehensive MRSA colonization surveillance program. It is not intended to diagnose MRSA infection nor to guide or monitor treatment for MRSA infections.      Studies: Dg Chest Port 1 View  05/28/2015  CLINICAL DATA:  Abdominal pain.  Shortness of breath EXAM: PORTABLE CHEST 1 VIEW COMPARISON:  04/27/2015  FINDINGS: There is reticulation in the lower lungs, left more than right, compatible with scarring. Chronic mild elevation of the left diaphragm. There is no edema, consolidation, effusion, or pneumothorax. Normal heart size and mediastinal contours. IMPRESSION: Stable.  No evidence of acute disease. Electronically Signed   By: Monte Fantasia M.D.   On: 05/19/2015 16:12    Scheduled Meds: . antiseptic oral rinse  7 mL Mouth Rinse q12n4p  . aspirin EC  81 mg Oral Daily  . aztreonam  1 g Intravenous Q8H  . chlorhexidine  15 mL Mouth Rinse BID  . donepezil  10 mg Oral QHS  . heparin  5,000 Units Subcutaneous 3 times per day  . insulin aspart  0-9 Units Subcutaneous 6 times per day  . levothyroxine  150 mcg Oral QAC breakfast  . memantine  10 mg Oral BID  . nystatin   Topical TID  . pantoprazole (PROTONIX) IV  40 mg Intravenous Q24H  . pravastatin  40 mg Oral q1800  . [START ON May 27, 2015] vancomycin  1,000 mg Intravenous Q48H   Continuous Infusions: . sodium chloride 125 mL/hr at 05/24/2015 0500    Active Problems:   HTN (hypertension)   PVD (peripheral vascular disease) (HCC)   Candidiasis of skin   Diabetes type 2, uncontrolled (HCC)   Dementia   Sepsis ([place])   AKI (acute kidney injury) ([place])   Pressure ulcer   Protein-calorie malnutrition, severe (HCC)   Hyperkalemia   Elevated troponin   Functional quadriplegia (HCC)   UTI (urinary tract infection)   Urinary retention    Time spent: 35  minutes    Jehanzeb Memon. MD  [facility] Pager [phone]. If 7PM-7AM, please contact night-coverage at [email or web address], password [facility] 05/29/2015, 7:37 AM  LOS: 2 days     By signing my name below, I, Rosalie Doctor, attest that this documentation has been prepared under the direction and in the presence of [facility]. MD Electronically Signed: Rosalie Doctor, Scribe. 05/11/2015  8:51am  I, Dr. Kathie Dike, personally performed the services described in this  documentaiton. All medical record entries made by the scribe were at my direction and in my presence. I have reviewed the chart and agree that the record reflects my personal performance and is accurate and complete  Kathie Dike, MD, 05/28/2015 9:11 AM

## 2015-05-20 NOTE — Progress Notes (Signed)
During shift assessment patient became apneic and went into asystole. Time 1957. Son at bedside. No pulse or breath sounds auscultated. Verified by second RN. Time of Death 59.

## 2015-05-20 NOTE — Plan of Care (Signed)
Family meeting at bedside, @ 1430

## 2015-05-20 NOTE — Progress Notes (Signed)
[facility] called and patient referred. Spoke with Caesar Chestnut. Body given full release. Referral Number is [phone]

## 2015-05-20 NOTE — Consult Note (Signed)
Consultation Note Date: 05/09/2015   Patient Name: Peter Harvey  DOB: 1940/06/22  MRN: [id number]  Age / Sex: 75 y.o., male  PCP: Peter Lincoln, MD Referring Physician: Kathie Dike, MD  Reason for Consultation: Establishing goals of care, Psychosocial/spiritual support and Terminal Care    Clinical Assessment/Narrative: Peter Harvey is resting quietly in bed.  He does not open his eyes when I speak to him. Son Peter Harvey and wife Peter Harvey, son Peter Harvey, daughter in law/ live in caregiver Peter Harvey [phone] ex wife) are present today.  We talk about Peter Harvey current health conditions, and review his labs.  We talks about the chronic illness trajectory for dementia.  We talk about expected declines with mobility and nutrition. They share he has been wc bound for 3 years (since his last surgery).    We talk about the likelihood that he will not be able to overcome this illness.    We talk about HCPOA and AD.  His sons share that he said, "if I can get better do what you can".  They agree that he has had a decline over the last few weeks, and has not been eating enough to sustain him since his last hospitalization in December.  We talk about eating less, interacting less, and sleeping more as the body nears death.    We talk about the concept of comfort care.  Sons share that daughter Peter Harvey (LPN) is coming tonight.  Their goal is for her to be able to speak with Peter Harvey.  They share that they feel she will agree to comfort care.   They tell me that they want to meet as a family tonight.  We agree to meet again in the morning.     Contacts/Participants in Discussion: Son Andrie Krysinski and wife Peter Harvey, son Peter Harvey, daughter in law/ live in caregiver Peter Harvey [phone] ex wife).   Primary Decision Maker: Peter Harvey and Peter Harvey, daughter Peter Harvey coming today from [place]   Relationship to Patient children HCPOA: no   SUMMARY OF  RECOMMENDATIONS  Code Status/Advance Care Planning: DNR    Code Status Orders        Start     Ordered   06/07/2015 1834  Do not attempt resuscitation (DNR)   Continuous    Question Answer Comment  In the event of cardiac or respiratory ARREST Do not call a "code blue"   In the event of cardiac or respiratory ARREST Do not perform Intubation, CPR, defibrillation or ACLS   In the event of cardiac or respiratory ARREST Use medication by any route, position, wound care, and other measures to relive pain and suffering. May use oxygen, suction and manual treatment of airway obstruction as needed for comfort.      05/13/2015 1833    Code Status History    Date Active Date Inactive Code Status Order ID Comments User Context   04/28/2015  1:39 AM 04/30/2015  2:47 PM Full Code [id number]  Oswald Hillock, MD ED   01/05/2015 10:07 PM 01/07/2015  2:12 PM DNR [id number]  Oswald Hillock, MD Inpatient   12/01/2012 11:29 PM 12/03/2012  3:36 PM Full Code [id number]  Karlyn Agee, MD Inpatient      Other Directives:None  Symptom Management:   Morphine 2.5 mg SL Q 4 hours PRN.   Palliative Prophylaxis:   Aspiration, Frequent Pain Assessment, Oral Care and Turn Reposition  Additional Recommendations (Limitations, Scope, Preferences):  Sons are waiting for sister Peter Harvey to arrive from [place].  Arrive  at [facility] around midnight.  They want to be able to interact with Peter Harvey.  They will discuss transition to comfort care at that time.   Psycho-social/Spiritual:  Support System: Strong Desire for further Chaplaincy support:Not discussed today.  Additional Recommendations: None at this time.   Prognosis: Hours - Days  Discharge Planning: Anticipated Hospital Death   Chief Complaint/ Primary Diagnoses: Present on Admission:  . Sepsis ([place]) . PVD (peripheral vascular disease) ([place]) . Diabetes type 2, uncontrolled ([place]) . Candidiasis of skin . AKI (acute kidney injury) ([place]) . Protein-calorie  malnutrition, severe ([place]) . HTN (hypertension) . Hyperkalemia . Elevated troponin . Dementia . Functional quadriplegia (HCC) . UTI (urinary tract infection) . Urinary retention . Thrombocytopenia ([place])  I have reviewed the medical record, interviewed the patient and family, and examined the patient. The following aspects are pertinent.  Past Medical History  Diagnosis Date  . HTN (hypertension)   . DM (diabetes mellitus) ([place])   . Hypothyroidism   . Hernia   . Dizziness   . AAA (abdominal aortic aneurysm) ([place])     Peter Harvey  . Hyperlipidemia   . Leg pain     with walking  . Colon cancer ([place])     adenocarcinoma; s/p R hemicolectomy 8/12  . CAD (coronary artery disease)     EF 57% by Myoview 4/12;  cath 5/12: dLM 50%, pLAD 50%, pCFX 50%, OM1 occluded with distal filling with L-L collats, mRCA 70-75%, dRCA 80%;  severe RCA needs PCI but treated medically due to need for colon surgery and plans to possilby perform PCI in future  . PAD (peripheral artery disease) (HCC)     c/b dry gangrene of toes  . CHF (congestive heart failure) ([place])   . COPD (chronic obstructive pulmonary disease) ([place])   . Dementia   . Wheelchair bound    Social History   Social History  . Marital Status: Divorced    Spouse Name: N/A  . Number of Children: N/A  . Years of Education: N/A   Social History Main Topics  . Smoking status: Former Smoker -- 2.00 packs/day for 50 years    Types: Cigarettes    Quit date: 12/11/2010  . Smokeless tobacco: Never Used  . Alcohol Use: No  . Drug Use: No  . Sexual Activity: Not Asked   Other Topics Concern  . None   Social History Narrative   Family History  Problem Relation Age of Onset  . Diabetes Mother   . Heart disease Mother     heart failure  . Heart attack Mother   . Heart disease Brother   . Other Brother 26    MRSA  . COPD Father   . Heart disease Father     heart failure  . COPD Sister    Scheduled Meds: . antiseptic oral rinse   7 mL Mouth Rinse q12n4p  . aspirin EC  81 mg Oral Daily  . atropine  1 drop Sublingual TID  . aztreonam  1 g Intravenous Q8H  . chlorhexidine  15 mL Mouth Rinse BID  . donepezil  10 mg Oral QHS  . heparin  5,000 Units Subcutaneous 3 times per day  . insulin aspart  0-9 Units Subcutaneous 6 times per day  . levothyroxine  150 mcg Oral QAC breakfast  . memantine  10 mg Oral BID  . nystatin   Topical TID  . pantoprazole (PROTONIX) IV  40 mg Intravenous Q24H  . pravastatin  40  mg Oral q1800  . [START ON 05-25-2015] vancomycin  1,000 mg Intravenous Q48H   Continuous Infusions: . sodium chloride 125 mL/hr at 06/01/2015 1600   PRN Meds:.acetaminophen **OR** acetaminophen, gabapentin, morphine CONCENTRATE, ondansetron **OR** ondansetron (ZOFRAN) IV Medications Prior to Admission:  Prior to Admission medications   Medication Sig Start Date End Date Taking? Authorizing Provider  amLODipine (NORVASC) 5 MG tablet Take 5 mg by mouth daily.   Yes Historical Provider, MD  aspirin EC 81 MG tablet Take 81 mg by mouth daily.     Yes Historical Provider, MD  atenolol (TENORMIN) 50 MG tablet Take 50 mg by mouth daily.     Yes Historical Provider, MD  donepezil (ARICEPT) 10 MG tablet Take 10 mg by mouth at bedtime.    Yes Historical Provider, MD  gabapentin (NEURONTIN) 100 MG capsule Take 100 mg by mouth daily as needed (for neuropathy).  03/27/14  Yes Historical Provider, MD  HUMALOG 100 UNIT/ML injection Inject 3-5 Units into the skin 2 (two) times daily with a meal. Sliding scale 10/27/14  Yes Historical Provider, MD  ibuprofen (ADVIL,MOTRIN) 200 MG tablet Take 200 mg by mouth every 6 (six) hours as needed for mild pain or moderate pain.   Yes Historical Provider, MD  LANTUS 100 UNIT/ML injection Inject 15 Units into the skin at bedtime. Personal sliding scale 03/14/11  Yes Historical Provider, MD  levothyroxine (SYNTHROID, LEVOTHROID) 150 MCG tablet Take 1 tablet (150 mcg total) by mouth daily before  breakfast. 04/30/15  Yes Orvan Falconer, MD  memantine (NAMENDA) 10 MG tablet Take 10 mg by mouth 2 (two) times daily.  12/06/14  Yes Historical Provider, MD  olmesartan (BENICAR) 20 MG tablet Take 20 mg by mouth 2 (two) times daily before a meal.    Yes Historical Provider, MD  potassium chloride SA (K-DUR,KLOR-CON) 20 MEQ tablet Take 1 tablet (20 mEq total) by mouth daily. Patient taking differently: Take 10 mEq by mouth 2 (two) times daily.  04/30/15  Yes Orvan Falconer, MD  pravastatin (PRAVACHOL) 40 MG tablet Take 40 mg by mouth every morning.  07/08/12  Yes Historical Provider, MD  levofloxacin (LEVAQUIN) 500 MG tablet Take 500 mg by mouth daily. Reported on 06/01/2015 05/06/15   Historical Provider, MD   Allergies  Allergen Reactions  . Penicillins Rash    Has patient had a PCN reaction causing immediate rash, facial/tongue/throat swelling, SOB or lightheadedness with hypotension:unknown Has patient had a PCN reaction causing severe rash involving mucus membranes or skin necrosis:unknown Has patient had a PCN reaction that required hospitalization unknown Has patient had a PCN reaction occurring within the last 10 years:No If all of the above answers are "NO", then may proceed with Cephalosporin use. Pt is not aware of this allergy    Review of Systems  Unable to perform ROS: Dementia    Physical Exam  Nursing note and vitals reviewed. Constitutional:  Frail and thin  HENT:  Head: Normocephalic and atraumatic.  Respiratory:  Shallow respiration, coarse breath sounds, diminished bases  GI: Soft. There is no guarding.  Neurological:  lethargic    Vital Signs: BP 62/41 mmHg  Pulse 86  Temp(Src) 98.8 F (37.1 C) (Axillary)  Resp 26  Ht 5' 10.5" (1.791 m)  Wt 68.2 kg (150 lb 5.7 oz)  BMI 21.26 kg/m2  SpO2 100%  SpO2: SpO2: 100 % O2 Device:SpO2: 100 % O2 Flow Rate: .O2 Flow Rate (L/min): 4 L/min  IO: Intake/output summary:  Intake/Output Summary (Last 24 hours)  at 05/24/2015  1754 Last data filed at 06/03/2015 1600  Gross per 24 hour  Intake   3200 ml  Output    600 ml  Net   2600 ml    LBM: Last BM Date: 06/02/2015 Baseline Weight: Weight: 68.04 kg (150 lb) Most recent weight: Weight: 68.2 kg (150 lb 5.7 oz)      Palliative Assessment/Data:  Flowsheet Rows        Most Recent Value   Intake Tab    Referral Department  Hospitalist   Unit at Time of Referral  ICU   Palliative Care Primary Diagnosis  Sepsis/Infectious Disease   Date Notified  05/19/15   Palliative Care Type  New Palliative care   Reason for referral  Clarify Goals of Care, Counsel Regarding Hospice   Date of Admission  06/03/2015   # of days IP prior to Palliative referral  1   Clinical Assessment    Psychosocial & Spiritual Assessment    Palliative Care Outcomes       Additional Data Reviewed:  CBC:    Component Value Date/Time   WBC 12.1* 05/27/2015 0420   HGB 10.8* 05/10/2015 0420   HCT 33.9* 05/09/2015 0420   PLT 77* 05/13/2015 0420   MCV 91.1 05/30/2015 0420   NEUTROABS 16.6* 05/16/2015 1524   LYMPHSABS 0.9 05/09/2015 1524   MONOABS 0.9 05/30/2015 1524   EOSABS 0.0 06/01/2015 1524   BASOSABS 0.0 05/28/2015 1524   Comprehensive Metabolic Panel:    Component Value Date/Time   NA 145 05/26/2015 0420   K 4.3 05/14/2015 0420   CL 119* 06/04/2015 0420   CO2 16* 05/17/2015 0420   BUN 88* 05/19/2015 0420   CREATININE 4.15* 06/01/2015 0420   GLUCOSE 123* 05/27/2015 0420   CALCIUM 7.5* 05/19/2015 0420   AST 91* 06/08/2015 0420   ALT 41 05/09/2015 0420   ALKPHOS 160* 05/30/2015 0420   BILITOT 0.6 05/26/2015 0420   PROT 4.4* 05/23/2015 0420   ALBUMIN 1.5* 05/10/2015 0420     Time In: 1430 Time Out: 1550 Time Total:  80 minutes GOC discussion shared with nursing staff, CM, and Dr. Roderic Palau.  Greater than 50%  of this time was spent counseling and coordinating care related to the above assessment and plan.  Signed by: Drue Novel, NP  Drue Novel, NP  05/22/2015,  5:54 PM  Please contact Palliative Medicine Team phone at [phone] for questions and concerns.

## 2015-05-22 LAB — CULTURE, BLOOD (ROUTINE X 2)

## 2015-06-09 NOTE — Discharge Summary (Addendum)
Death Summary  COLESTON SARFF [id number] DOB: [birth year] DOA: 05/27/15  PCP: Leonides Grills, MD   Admit date: 2015-05-27 Date of Death: 05/30/15  Final Diagnoses:  Active Problems:   HTN (hypertension)   PVD (peripheral vascular disease) (HCC)   Candidiasis of skin   Diabetes type 2, uncontrolled ([place])   Dementia   Sepsis ([place]) due to fungemia, from candidemia   AKI (acute kidney injury) ([place])   Pressure ulcer,- Stage II; Mid; Lower; Coccyx; present on arrival ,- Stage II; Right; Left; Mid; Sacrum; present on arrival   Protein-calorie malnutrition, severe (HCC)   Hyperkalemia   Elevated troponin due to demand ischemia   Functional quadriplegia (HCC)   UTI (urinary tract infection), candiduria likely due to disseminated candidemia   Urinary retention   Thrombocytopenia ([place])   Acute renal failure ([place])   Palliative care encounter   DNR (do not resuscitate) discussion    History of present illness:  75 year old patient with past history of dementia who was brought to the hospital for complaints of abdominal pain. He was recently admitted to the hospital and found to have a viral gastroenteritis. After returning home, his son reported that he had not been doing well. He had very little by mouth intake. On evaluation in the emergency room he was found to have acute renal failure with a creatinine of 4.69 and a BUN of 91. Lactic acid was noted to be elevated. Patient was hypotensive and was concerned for underlying urinary tract infection. He was admitted for further treatments.  Hospital Course:  Patient was admitted to the stepdown unit and was being treated for sepsis secondary to urinary tract infection. He was aggressively hydrated with IV fluids per sepsis protocol. He was started on broad-spectrum antibiotics. Patient was very frail, malnourished and very poor functional capacity. Albumin was noted to be markedly low at 1.5. Blood pressure remained low throughout his  hospital course to despite aggressive IV hydration. After discussing with the family, and considering his progressive decline, decision was made to pursue very conservative treatments. He was not started on pressors and central line was not placed. He was continued with IV fluids and antibiotics, but unfortunately his condition continued to deteriorate. He remained hypotensive despite IV fluid boluses. He was seen by palliative care to discuss goals of care and family had elected to pursue comfort measures, although they wish to wait for one of the patient's daughters to arrive from out of town. Unfortunately patient continued to decline and despite continued antibiotics and IV fluids, became apneic and pulseless on 05-28-22 at 19:57. He was pronounced at that time. Family was provided support.   Time: 24mins  Signed:  Boniface Goffe  [facility] 05-30-15, 4:48 PM

## 2015-06-09 DEATH — deceased

## 2016-02-24 IMAGING — CR DG CHEST 1V PORT
1 series · 1 of 1 positions shown · non-contrast
Comparison: 04/27/2015

CLINICAL DATA: Abdominal pain.  Shortness of breath

EXAM:
PORTABLE CHEST 1 VIEW

[ap portable]
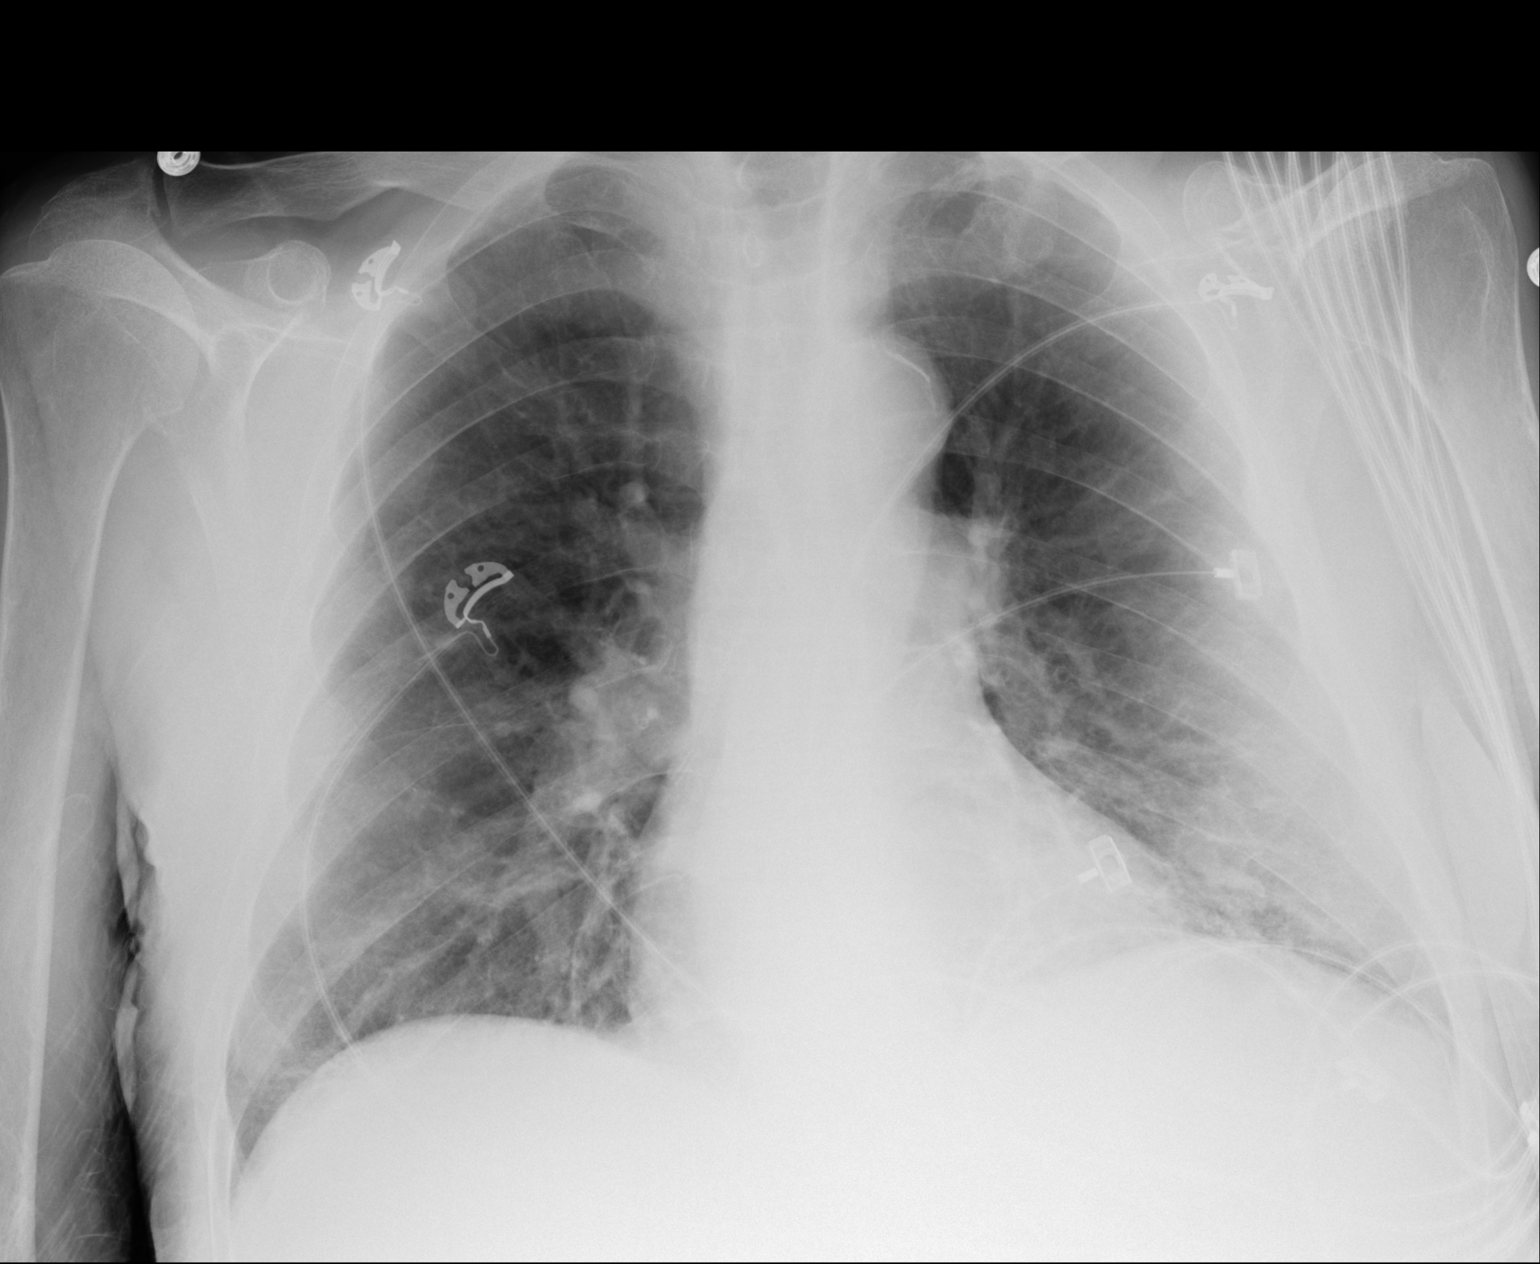

[1 of 1 positions shown; findings below may reference images not displayed]

FINDINGS: There is reticulation in the lower lungs, left more than right,
compatible with scarring. Chronic mild elevation of the left
diaphragm. There is no edema, consolidation, effusion, or
pneumothorax. Normal heart size and mediastinal contours.
IMPRESSION: Stable.  No evidence of acute disease.
